# Patient Record
Sex: Male | Born: 1955
Health system: Southern US, Community
[De-identification: ages and names within clinical notes are randomized; demographics above are authoritative.]

## PROBLEM LIST (undated history)

## (undated) DIAGNOSIS — I4891 Unspecified atrial fibrillation: Secondary | ICD-10-CM

## (undated) DIAGNOSIS — I10 Essential (primary) hypertension: Secondary | ICD-10-CM

## (undated) DIAGNOSIS — I499 Cardiac arrhythmia, unspecified: Secondary | ICD-10-CM

## (undated) DIAGNOSIS — Z86018 Personal history of other benign neoplasm: Secondary | ICD-10-CM

## (undated) DIAGNOSIS — Z87442 Personal history of urinary calculi: Secondary | ICD-10-CM

## (undated) DIAGNOSIS — M1990 Unspecified osteoarthritis, unspecified site: Secondary | ICD-10-CM

## (undated) DIAGNOSIS — C801 Malignant (primary) neoplasm, unspecified: Secondary | ICD-10-CM

## (undated) DIAGNOSIS — D3500 Benign neoplasm of unspecified adrenal gland: Secondary | ICD-10-CM

## (undated) DIAGNOSIS — E119 Type 2 diabetes mellitus without complications: Secondary | ICD-10-CM

## (undated) DIAGNOSIS — K219 Gastro-esophageal reflux disease without esophagitis: Secondary | ICD-10-CM

## (undated) DIAGNOSIS — M4800 Spinal stenosis, site unspecified: Secondary | ICD-10-CM

## (undated) DIAGNOSIS — G4733 Obstructive sleep apnea (adult) (pediatric): Secondary | ICD-10-CM

## (undated) DIAGNOSIS — E039 Hypothyroidism, unspecified: Secondary | ICD-10-CM

## (undated) HISTORY — PX: THYROIDECTOMY: SHX17

## (undated) HISTORY — PX: OTHER SURGICAL HISTORY: SHX169

## (undated) HISTORY — DX: Malignant (primary) neoplasm, unspecified: C80.1

## (undated) HISTORY — PX: EYE SURGERY: SHX253

## (undated) HISTORY — DX: Benign neoplasm of unspecified adrenal gland: D35.00

## (undated) HISTORY — PX: UMBILICAL HERNIA REPAIR: SHX196

## (undated) HISTORY — PX: BACK SURGERY: SHX140

## (undated) HISTORY — PX: TONSILLECTOMY: SUR1361

## (undated) HISTORY — PX: CYSTOSCOPY: SUR368

## (undated) HISTORY — PX: ADRENAL GLAND SURGERY: SHX544

## (undated) HISTORY — PX: COLONOSCOPY: SHX174

## (undated) SURGERY — Surgical Case
Anesthesia: *Unknown

## (undated) MED FILL — Fluorouracil IV Soln 5 GM/100ML (50 MG/ML): INTRAVENOUS | Qty: 121 | Status: AC

---

## 2011-10-07 ENCOUNTER — Other Ambulatory Visit: Payer: Self-pay | Admitting: Internal Medicine

## 2011-10-07 DIAGNOSIS — C73 Malignant neoplasm of thyroid gland: Secondary | ICD-10-CM

## 2011-10-15 ENCOUNTER — Other Ambulatory Visit: Payer: Self-pay

## 2011-12-29 ENCOUNTER — Other Ambulatory Visit: Payer: Self-pay

## 2013-09-28 ENCOUNTER — Other Ambulatory Visit (HOSPITAL_BASED_OUTPATIENT_CLINIC_OR_DEPARTMENT_OTHER): Payer: Self-pay | Admitting: Family Medicine

## 2013-09-28 DIAGNOSIS — N200 Calculus of kidney: Secondary | ICD-10-CM

## 2013-09-28 DIAGNOSIS — R109 Unspecified abdominal pain: Secondary | ICD-10-CM

## 2013-09-29 ENCOUNTER — Other Ambulatory Visit (HOSPITAL_BASED_OUTPATIENT_CLINIC_OR_DEPARTMENT_OTHER): Payer: Self-pay

## 2015-05-29 DIAGNOSIS — E784 Other hyperlipidemia: Secondary | ICD-10-CM | POA: Diagnosis not present

## 2015-05-29 DIAGNOSIS — E038 Other specified hypothyroidism: Secondary | ICD-10-CM | POA: Diagnosis not present

## 2015-05-29 DIAGNOSIS — I10 Essential (primary) hypertension: Secondary | ICD-10-CM | POA: Diagnosis not present

## 2015-05-29 DIAGNOSIS — Z6833 Body mass index (BMI) 33.0-33.9, adult: Secondary | ICD-10-CM | POA: Diagnosis not present

## 2015-05-29 DIAGNOSIS — Z125 Encounter for screening for malignant neoplasm of prostate: Secondary | ICD-10-CM | POA: Diagnosis not present

## 2015-05-29 DIAGNOSIS — G4733 Obstructive sleep apnea (adult) (pediatric): Secondary | ICD-10-CM | POA: Diagnosis not present

## 2015-07-27 DIAGNOSIS — G4733 Obstructive sleep apnea (adult) (pediatric): Secondary | ICD-10-CM | POA: Diagnosis not present

## 2015-09-18 DIAGNOSIS — E784 Other hyperlipidemia: Secondary | ICD-10-CM | POA: Diagnosis not present

## 2015-09-18 DIAGNOSIS — I10 Essential (primary) hypertension: Secondary | ICD-10-CM | POA: Diagnosis not present

## 2015-09-18 DIAGNOSIS — E038 Other specified hypothyroidism: Secondary | ICD-10-CM | POA: Diagnosis not present

## 2015-09-18 DIAGNOSIS — G4733 Obstructive sleep apnea (adult) (pediatric): Secondary | ICD-10-CM | POA: Diagnosis not present

## 2015-09-18 DIAGNOSIS — Z6834 Body mass index (BMI) 34.0-34.9, adult: Secondary | ICD-10-CM | POA: Diagnosis not present

## 2015-09-18 DIAGNOSIS — E1165 Type 2 diabetes mellitus with hyperglycemia: Secondary | ICD-10-CM | POA: Diagnosis not present

## 2015-10-04 DIAGNOSIS — T63441A Toxic effect of venom of bees, accidental (unintentional), initial encounter: Secondary | ICD-10-CM | POA: Diagnosis not present

## 2016-01-18 DIAGNOSIS — R10823 Right lower quadrant rebound abdominal tenderness: Secondary | ICD-10-CM | POA: Diagnosis not present

## 2016-01-18 DIAGNOSIS — E1165 Type 2 diabetes mellitus with hyperglycemia: Secondary | ICD-10-CM | POA: Diagnosis not present

## 2016-01-18 DIAGNOSIS — E784 Other hyperlipidemia: Secondary | ICD-10-CM | POA: Diagnosis not present

## 2016-01-18 DIAGNOSIS — I10 Essential (primary) hypertension: Secondary | ICD-10-CM | POA: Diagnosis not present

## 2016-02-28 DIAGNOSIS — N132 Hydronephrosis with renal and ureteral calculous obstruction: Secondary | ICD-10-CM | POA: Diagnosis not present

## 2016-02-28 DIAGNOSIS — Z6834 Body mass index (BMI) 34.0-34.9, adult: Secondary | ICD-10-CM | POA: Diagnosis not present

## 2016-02-28 DIAGNOSIS — N200 Calculus of kidney: Secondary | ICD-10-CM | POA: Diagnosis not present

## 2016-03-03 DIAGNOSIS — N201 Calculus of ureter: Secondary | ICD-10-CM | POA: Diagnosis not present

## 2016-03-03 DIAGNOSIS — R3912 Poor urinary stream: Secondary | ICD-10-CM | POA: Diagnosis not present

## 2016-03-04 DIAGNOSIS — E119 Type 2 diabetes mellitus without complications: Secondary | ICD-10-CM | POA: Diagnosis not present

## 2016-03-04 DIAGNOSIS — I10 Essential (primary) hypertension: Secondary | ICD-10-CM | POA: Diagnosis not present

## 2016-03-04 DIAGNOSIS — Z466 Encounter for fitting and adjustment of urinary device: Secondary | ICD-10-CM | POA: Diagnosis not present

## 2016-03-04 DIAGNOSIS — G4730 Sleep apnea, unspecified: Secondary | ICD-10-CM | POA: Diagnosis not present

## 2016-03-04 DIAGNOSIS — N201 Calculus of ureter: Secondary | ICD-10-CM | POA: Diagnosis not present

## 2016-03-13 DIAGNOSIS — G4730 Sleep apnea, unspecified: Secondary | ICD-10-CM | POA: Diagnosis not present

## 2016-03-13 DIAGNOSIS — I10 Essential (primary) hypertension: Secondary | ICD-10-CM | POA: Diagnosis not present

## 2016-03-13 DIAGNOSIS — Z960 Presence of urogenital implants: Secondary | ICD-10-CM | POA: Diagnosis not present

## 2016-03-13 DIAGNOSIS — N201 Calculus of ureter: Secondary | ICD-10-CM | POA: Diagnosis not present

## 2016-05-27 DIAGNOSIS — Z6833 Body mass index (BMI) 33.0-33.9, adult: Secondary | ICD-10-CM | POA: Diagnosis not present

## 2016-05-27 DIAGNOSIS — I10 Essential (primary) hypertension: Secondary | ICD-10-CM | POA: Diagnosis not present

## 2016-05-27 DIAGNOSIS — N200 Calculus of kidney: Secondary | ICD-10-CM | POA: Diagnosis not present

## 2016-07-16 DIAGNOSIS — Z85850 Personal history of malignant neoplasm of thyroid: Secondary | ICD-10-CM | POA: Diagnosis not present

## 2016-07-16 DIAGNOSIS — E890 Postprocedural hypothyroidism: Secondary | ICD-10-CM | POA: Diagnosis not present

## 2016-07-16 DIAGNOSIS — D3500 Benign neoplasm of unspecified adrenal gland: Secondary | ICD-10-CM | POA: Diagnosis not present

## 2016-07-25 ENCOUNTER — Telehealth: Payer: Self-pay | Admitting: Genetics

## 2016-07-25 NOTE — Telephone Encounter (Signed)
Received a call from [facility] to schedule the pt an appt for genetic counseling. Appt has been scheduled for the pt to see Vicente Males on 6/26 at 2pm.

## 2016-08-03 ENCOUNTER — Encounter: Payer: Self-pay | Admitting: Genetics

## 2016-08-12 ENCOUNTER — Encounter: Payer: Self-pay | Admitting: Genetics

## 2016-08-12 ENCOUNTER — Other Ambulatory Visit: Payer: Self-pay

## 2016-08-22 DIAGNOSIS — Z6835 Body mass index (BMI) 35.0-35.9, adult: Secondary | ICD-10-CM | POA: Diagnosis not present

## 2016-08-22 DIAGNOSIS — K420 Umbilical hernia with obstruction, without gangrene: Secondary | ICD-10-CM | POA: Diagnosis not present

## 2016-08-22 DIAGNOSIS — I10 Essential (primary) hypertension: Secondary | ICD-10-CM | POA: Diagnosis not present

## 2016-09-02 DIAGNOSIS — N202 Calculus of kidney with calculus of ureter: Secondary | ICD-10-CM | POA: Diagnosis not present

## 2016-09-02 DIAGNOSIS — K5730 Diverticulosis of large intestine without perforation or abscess without bleeding: Secondary | ICD-10-CM | POA: Diagnosis not present

## 2016-09-08 DIAGNOSIS — K429 Umbilical hernia without obstruction or gangrene: Secondary | ICD-10-CM | POA: Diagnosis not present

## 2016-09-17 DIAGNOSIS — K429 Umbilical hernia without obstruction or gangrene: Secondary | ICD-10-CM | POA: Diagnosis not present

## 2016-09-23 DIAGNOSIS — G4733 Obstructive sleep apnea (adult) (pediatric): Secondary | ICD-10-CM | POA: Diagnosis not present

## 2016-10-01 DIAGNOSIS — Z1211 Encounter for screening for malignant neoplasm of colon: Secondary | ICD-10-CM | POA: Diagnosis not present

## 2016-10-01 DIAGNOSIS — K429 Umbilical hernia without obstruction or gangrene: Secondary | ICD-10-CM | POA: Diagnosis not present

## 2016-10-13 DIAGNOSIS — K5730 Diverticulosis of large intestine without perforation or abscess without bleeding: Secondary | ICD-10-CM | POA: Diagnosis not present

## 2016-10-13 DIAGNOSIS — D128 Benign neoplasm of rectum: Secondary | ICD-10-CM | POA: Diagnosis not present

## 2016-10-13 DIAGNOSIS — Q438 Other specified congenital malformations of intestine: Secondary | ICD-10-CM | POA: Diagnosis not present

## 2016-10-13 DIAGNOSIS — Z1211 Encounter for screening for malignant neoplasm of colon: Secondary | ICD-10-CM | POA: Diagnosis not present

## 2016-10-24 DIAGNOSIS — G4733 Obstructive sleep apnea (adult) (pediatric): Secondary | ICD-10-CM | POA: Diagnosis not present

## 2016-10-28 DIAGNOSIS — I872 Venous insufficiency (chronic) (peripheral): Secondary | ICD-10-CM | POA: Diagnosis not present

## 2016-10-28 DIAGNOSIS — G4739 Other sleep apnea: Secondary | ICD-10-CM | POA: Diagnosis not present

## 2016-10-28 DIAGNOSIS — I10 Essential (primary) hypertension: Secondary | ICD-10-CM | POA: Diagnosis not present

## 2016-10-28 DIAGNOSIS — E119 Type 2 diabetes mellitus without complications: Secondary | ICD-10-CM | POA: Diagnosis not present

## 2016-11-23 DIAGNOSIS — G4733 Obstructive sleep apnea (adult) (pediatric): Secondary | ICD-10-CM | POA: Diagnosis not present

## 2016-11-25 DIAGNOSIS — I10 Essential (primary) hypertension: Secondary | ICD-10-CM | POA: Diagnosis not present

## 2016-11-25 DIAGNOSIS — G4730 Sleep apnea, unspecified: Secondary | ICD-10-CM | POA: Diagnosis not present

## 2016-11-25 DIAGNOSIS — K439 Ventral hernia without obstruction or gangrene: Secondary | ICD-10-CM | POA: Diagnosis not present

## 2016-11-25 DIAGNOSIS — E039 Hypothyroidism, unspecified: Secondary | ICD-10-CM | POA: Diagnosis not present

## 2016-12-09 DIAGNOSIS — E668 Other obesity: Secondary | ICD-10-CM | POA: Diagnosis not present

## 2016-12-09 DIAGNOSIS — F1099 Alcohol use, unspecified with unspecified alcohol-induced disorder: Secondary | ICD-10-CM | POA: Diagnosis not present

## 2016-12-09 DIAGNOSIS — E896 Postprocedural adrenocortical (-medullary) hypofunction: Secondary | ICD-10-CM | POA: Diagnosis not present

## 2016-12-09 DIAGNOSIS — Z791 Long term (current) use of non-steroidal anti-inflammatories (NSAID): Secondary | ICD-10-CM | POA: Diagnosis not present

## 2016-12-09 DIAGNOSIS — E890 Postprocedural hypothyroidism: Secondary | ICD-10-CM | POA: Diagnosis not present

## 2016-12-09 DIAGNOSIS — Z79899 Other long term (current) drug therapy: Secondary | ICD-10-CM | POA: Diagnosis not present

## 2016-12-09 DIAGNOSIS — Z91018 Allergy to other foods: Secondary | ICD-10-CM | POA: Diagnosis not present

## 2016-12-09 DIAGNOSIS — G4730 Sleep apnea, unspecified: Secondary | ICD-10-CM | POA: Diagnosis not present

## 2016-12-09 DIAGNOSIS — M549 Dorsalgia, unspecified: Secondary | ICD-10-CM | POA: Diagnosis not present

## 2016-12-09 DIAGNOSIS — Z7982 Long term (current) use of aspirin: Secondary | ICD-10-CM | POA: Diagnosis not present

## 2016-12-09 DIAGNOSIS — E669 Obesity, unspecified: Secondary | ICD-10-CM | POA: Diagnosis not present

## 2016-12-09 DIAGNOSIS — K429 Umbilical hernia without obstruction or gangrene: Secondary | ICD-10-CM | POA: Diagnosis not present

## 2016-12-09 DIAGNOSIS — I10 Essential (primary) hypertension: Secondary | ICD-10-CM | POA: Diagnosis not present

## 2016-12-09 DIAGNOSIS — M1990 Unspecified osteoarthritis, unspecified site: Secondary | ICD-10-CM | POA: Diagnosis not present

## 2016-12-09 DIAGNOSIS — Z6834 Body mass index (BMI) 34.0-34.9, adult: Secondary | ICD-10-CM | POA: Diagnosis not present

## 2016-12-09 DIAGNOSIS — Z7984 Long term (current) use of oral hypoglycemic drugs: Secondary | ICD-10-CM | POA: Diagnosis not present

## 2016-12-09 DIAGNOSIS — K660 Peritoneal adhesions (postprocedural) (postinfection): Secondary | ICD-10-CM | POA: Diagnosis not present

## 2016-12-09 DIAGNOSIS — K439 Ventral hernia without obstruction or gangrene: Secondary | ICD-10-CM | POA: Diagnosis not present

## 2016-12-09 DIAGNOSIS — Z85850 Personal history of malignant neoplasm of thyroid: Secondary | ICD-10-CM | POA: Diagnosis not present

## 2016-12-10 DIAGNOSIS — Z79899 Other long term (current) drug therapy: Secondary | ICD-10-CM | POA: Diagnosis not present

## 2016-12-10 DIAGNOSIS — Z7982 Long term (current) use of aspirin: Secondary | ICD-10-CM | POA: Diagnosis not present

## 2016-12-10 DIAGNOSIS — E890 Postprocedural hypothyroidism: Secondary | ICD-10-CM | POA: Diagnosis not present

## 2016-12-10 DIAGNOSIS — Z85850 Personal history of malignant neoplasm of thyroid: Secondary | ICD-10-CM | POA: Diagnosis not present

## 2016-12-10 DIAGNOSIS — Z91018 Allergy to other foods: Secondary | ICD-10-CM | POA: Diagnosis not present

## 2016-12-10 DIAGNOSIS — M549 Dorsalgia, unspecified: Secondary | ICD-10-CM | POA: Diagnosis not present

## 2016-12-10 DIAGNOSIS — E669 Obesity, unspecified: Secondary | ICD-10-CM | POA: Diagnosis not present

## 2016-12-10 DIAGNOSIS — E896 Postprocedural adrenocortical (-medullary) hypofunction: Secondary | ICD-10-CM | POA: Diagnosis not present

## 2016-12-10 DIAGNOSIS — G4730 Sleep apnea, unspecified: Secondary | ICD-10-CM | POA: Diagnosis not present

## 2016-12-10 DIAGNOSIS — M1990 Unspecified osteoarthritis, unspecified site: Secondary | ICD-10-CM | POA: Diagnosis not present

## 2016-12-10 DIAGNOSIS — K439 Ventral hernia without obstruction or gangrene: Secondary | ICD-10-CM | POA: Diagnosis not present

## 2016-12-10 DIAGNOSIS — K429 Umbilical hernia without obstruction or gangrene: Secondary | ICD-10-CM | POA: Diagnosis not present

## 2016-12-10 DIAGNOSIS — Z6834 Body mass index (BMI) 34.0-34.9, adult: Secondary | ICD-10-CM | POA: Diagnosis not present

## 2016-12-10 DIAGNOSIS — I10 Essential (primary) hypertension: Secondary | ICD-10-CM | POA: Diagnosis not present

## 2016-12-10 DIAGNOSIS — Z7984 Long term (current) use of oral hypoglycemic drugs: Secondary | ICD-10-CM | POA: Diagnosis not present

## 2016-12-10 DIAGNOSIS — Z791 Long term (current) use of non-steroidal anti-inflammatories (NSAID): Secondary | ICD-10-CM | POA: Diagnosis not present

## 2016-12-24 DIAGNOSIS — G4733 Obstructive sleep apnea (adult) (pediatric): Secondary | ICD-10-CM | POA: Diagnosis not present

## 2016-12-29 DIAGNOSIS — I10 Essential (primary) hypertension: Secondary | ICD-10-CM | POA: Diagnosis not present

## 2016-12-29 DIAGNOSIS — E032 Hypothyroidism due to medicaments and other exogenous substances: Secondary | ICD-10-CM | POA: Diagnosis not present

## 2016-12-29 DIAGNOSIS — E119 Type 2 diabetes mellitus without complications: Secondary | ICD-10-CM | POA: Diagnosis not present

## 2016-12-29 DIAGNOSIS — I872 Venous insufficiency (chronic) (peripheral): Secondary | ICD-10-CM | POA: Diagnosis not present

## 2016-12-29 DIAGNOSIS — Z6834 Body mass index (BMI) 34.0-34.9, adult: Secondary | ICD-10-CM | POA: Diagnosis not present

## 2016-12-30 DIAGNOSIS — K439 Ventral hernia without obstruction or gangrene: Secondary | ICD-10-CM | POA: Diagnosis not present

## 2017-01-23 DIAGNOSIS — G4733 Obstructive sleep apnea (adult) (pediatric): Secondary | ICD-10-CM | POA: Diagnosis not present

## 2017-02-06 DIAGNOSIS — I10 Essential (primary) hypertension: Secondary | ICD-10-CM | POA: Diagnosis not present

## 2017-03-20 DIAGNOSIS — E119 Type 2 diabetes mellitus without complications: Secondary | ICD-10-CM | POA: Diagnosis not present

## 2017-03-20 DIAGNOSIS — I10 Essential (primary) hypertension: Secondary | ICD-10-CM | POA: Diagnosis not present

## 2017-03-20 DIAGNOSIS — M791 Myalgia, unspecified site: Secondary | ICD-10-CM | POA: Diagnosis not present

## 2017-03-26 DIAGNOSIS — G4733 Obstructive sleep apnea (adult) (pediatric): Secondary | ICD-10-CM | POA: Diagnosis not present

## 2017-04-23 DIAGNOSIS — G4733 Obstructive sleep apnea (adult) (pediatric): Secondary | ICD-10-CM | POA: Diagnosis not present

## 2017-05-14 DIAGNOSIS — M25561 Pain in right knee: Secondary | ICD-10-CM | POA: Diagnosis not present

## 2017-05-14 DIAGNOSIS — M25562 Pain in left knee: Secondary | ICD-10-CM | POA: Diagnosis not present

## 2017-05-14 DIAGNOSIS — M2550 Pain in unspecified joint: Secondary | ICD-10-CM | POA: Diagnosis not present

## 2017-05-22 DIAGNOSIS — M2560 Stiffness of unspecified joint, not elsewhere classified: Secondary | ICD-10-CM | POA: Diagnosis not present

## 2017-05-22 DIAGNOSIS — M791 Myalgia, unspecified site: Secondary | ICD-10-CM | POA: Diagnosis not present

## 2017-05-22 DIAGNOSIS — Z86018 Personal history of other benign neoplasm: Secondary | ICD-10-CM | POA: Diagnosis not present

## 2017-05-22 DIAGNOSIS — Z85850 Personal history of malignant neoplasm of thyroid: Secondary | ICD-10-CM | POA: Diagnosis not present

## 2017-05-22 DIAGNOSIS — M2550 Pain in unspecified joint: Secondary | ICD-10-CM | POA: Diagnosis not present

## 2017-05-22 DIAGNOSIS — E785 Hyperlipidemia, unspecified: Secondary | ICD-10-CM | POA: Diagnosis not present

## 2017-05-22 DIAGNOSIS — E890 Postprocedural hypothyroidism: Secondary | ICD-10-CM | POA: Diagnosis not present

## 2017-05-25 DIAGNOSIS — Z85850 Personal history of malignant neoplasm of thyroid: Secondary | ICD-10-CM | POA: Diagnosis not present

## 2017-05-25 DIAGNOSIS — E890 Postprocedural hypothyroidism: Secondary | ICD-10-CM | POA: Diagnosis not present

## 2017-06-01 DIAGNOSIS — Z86018 Personal history of other benign neoplasm: Secondary | ICD-10-CM | POA: Diagnosis not present

## 2017-06-02 DIAGNOSIS — M170 Bilateral primary osteoarthritis of knee: Secondary | ICD-10-CM | POA: Diagnosis not present

## 2017-06-15 DIAGNOSIS — E890 Postprocedural hypothyroidism: Secondary | ICD-10-CM | POA: Diagnosis not present

## 2017-06-23 DIAGNOSIS — G4733 Obstructive sleep apnea (adult) (pediatric): Secondary | ICD-10-CM | POA: Diagnosis not present

## 2017-07-03 DIAGNOSIS — M2550 Pain in unspecified joint: Secondary | ICD-10-CM | POA: Diagnosis not present

## 2017-07-03 DIAGNOSIS — E890 Postprocedural hypothyroidism: Secondary | ICD-10-CM | POA: Diagnosis not present

## 2017-07-03 DIAGNOSIS — Z86018 Personal history of other benign neoplasm: Secondary | ICD-10-CM | POA: Diagnosis not present

## 2017-07-03 DIAGNOSIS — Z85850 Personal history of malignant neoplasm of thyroid: Secondary | ICD-10-CM | POA: Diagnosis not present

## 2017-07-21 DIAGNOSIS — E890 Postprocedural hypothyroidism: Secondary | ICD-10-CM | POA: Diagnosis not present

## 2017-07-23 DIAGNOSIS — R031 Nonspecific low blood-pressure reading: Secondary | ICD-10-CM | POA: Diagnosis not present

## 2017-07-23 DIAGNOSIS — I10 Essential (primary) hypertension: Secondary | ICD-10-CM | POA: Diagnosis not present

## 2017-09-23 DIAGNOSIS — G4733 Obstructive sleep apnea (adult) (pediatric): Secondary | ICD-10-CM | POA: Diagnosis not present

## 2017-10-24 DIAGNOSIS — G4733 Obstructive sleep apnea (adult) (pediatric): Secondary | ICD-10-CM | POA: Diagnosis not present

## 2018-01-07 DIAGNOSIS — Z86018 Personal history of other benign neoplasm: Secondary | ICD-10-CM | POA: Diagnosis not present

## 2018-01-07 DIAGNOSIS — E890 Postprocedural hypothyroidism: Secondary | ICD-10-CM | POA: Diagnosis not present

## 2018-01-07 DIAGNOSIS — R300 Dysuria: Secondary | ICD-10-CM | POA: Diagnosis not present

## 2018-01-07 DIAGNOSIS — Z85850 Personal history of malignant neoplasm of thyroid: Secondary | ICD-10-CM | POA: Diagnosis not present

## 2018-01-07 DIAGNOSIS — Z125 Encounter for screening for malignant neoplasm of prostate: Secondary | ICD-10-CM | POA: Diagnosis not present

## 2018-01-07 DIAGNOSIS — R350 Frequency of micturition: Secondary | ICD-10-CM | POA: Diagnosis not present

## 2018-01-07 DIAGNOSIS — E119 Type 2 diabetes mellitus without complications: Secondary | ICD-10-CM | POA: Diagnosis not present

## 2018-01-07 DIAGNOSIS — M2550 Pain in unspecified joint: Secondary | ICD-10-CM | POA: Diagnosis not present

## 2018-02-08 DIAGNOSIS — R7989 Other specified abnormal findings of blood chemistry: Secondary | ICD-10-CM | POA: Diagnosis not present

## 2018-02-18 DIAGNOSIS — E785 Hyperlipidemia, unspecified: Secondary | ICD-10-CM | POA: Diagnosis not present

## 2018-02-18 DIAGNOSIS — R350 Frequency of micturition: Secondary | ICD-10-CM | POA: Diagnosis not present

## 2018-02-18 DIAGNOSIS — E119 Type 2 diabetes mellitus without complications: Secondary | ICD-10-CM | POA: Diagnosis not present

## 2018-02-18 DIAGNOSIS — I10 Essential (primary) hypertension: Secondary | ICD-10-CM | POA: Diagnosis not present

## 2018-03-01 ENCOUNTER — Other Ambulatory Visit: Payer: Self-pay | Admitting: Physician Assistant

## 2018-03-01 DIAGNOSIS — R1011 Right upper quadrant pain: Secondary | ICD-10-CM

## 2018-03-04 DIAGNOSIS — N200 Calculus of kidney: Secondary | ICD-10-CM | POA: Diagnosis not present

## 2018-03-04 DIAGNOSIS — R109 Unspecified abdominal pain: Secondary | ICD-10-CM | POA: Diagnosis not present

## 2018-07-02 DIAGNOSIS — S0181XA Laceration without foreign body of other part of head, initial encounter: Secondary | ICD-10-CM | POA: Diagnosis not present

## 2018-07-02 DIAGNOSIS — Y92411 Interstate highway as the place of occurrence of the external cause: Secondary | ICD-10-CM | POA: Diagnosis not present

## 2018-07-02 DIAGNOSIS — R079 Chest pain, unspecified: Secondary | ICD-10-CM | POA: Diagnosis not present

## 2018-07-02 DIAGNOSIS — Z23 Encounter for immunization: Secondary | ICD-10-CM | POA: Diagnosis not present

## 2018-07-09 ENCOUNTER — Other Ambulatory Visit: Payer: Self-pay | Admitting: Urology

## 2018-07-09 DIAGNOSIS — K5730 Diverticulosis of large intestine without perforation or abscess without bleeding: Secondary | ICD-10-CM | POA: Diagnosis not present

## 2018-07-09 DIAGNOSIS — N201 Calculus of ureter: Secondary | ICD-10-CM | POA: Diagnosis not present

## 2018-07-09 DIAGNOSIS — N132 Hydronephrosis with renal and ureteral calculous obstruction: Secondary | ICD-10-CM | POA: Diagnosis not present

## 2018-07-09 DIAGNOSIS — R1084 Generalized abdominal pain: Secondary | ICD-10-CM | POA: Diagnosis not present

## 2018-07-14 ENCOUNTER — Encounter (HOSPITAL_COMMUNITY)
Admission: RE | Admit: 2018-07-14 | Discharge: 2018-07-14 | Disposition: A | Discharge status: Home / Self Care | Payer: BLUE CROSS/BLUE SHIELD | Source: Ambulatory Visit | Attending: Urology | Admitting: Urology

## 2018-07-14 ENCOUNTER — Other Ambulatory Visit (HOSPITAL_COMMUNITY)
Admission: RE | Admit: 2018-07-14 | Discharge: 2018-07-14 | Disposition: A | Discharge status: Home / Self Care | Payer: BLUE CROSS/BLUE SHIELD | Source: Ambulatory Visit | Attending: Urology | Admitting: Urology

## 2018-07-14 ENCOUNTER — Other Ambulatory Visit: Payer: Self-pay

## 2018-07-14 ENCOUNTER — Encounter (HOSPITAL_COMMUNITY): Payer: Self-pay

## 2018-07-14 DIAGNOSIS — Z01812 Encounter for preprocedural laboratory examination: Secondary | ICD-10-CM | POA: Insufficient documentation

## 2018-07-14 DIAGNOSIS — N132 Hydronephrosis with renal and ureteral calculous obstruction: Secondary | ICD-10-CM | POA: Diagnosis not present

## 2018-07-14 DIAGNOSIS — N202 Calculus of kidney with calculus of ureter: Secondary | ICD-10-CM | POA: Diagnosis not present

## 2018-07-14 DIAGNOSIS — Z1159 Encounter for screening for other viral diseases: Secondary | ICD-10-CM | POA: Insufficient documentation

## 2018-07-14 HISTORY — DX: Essential (primary) hypertension: I10

## 2018-07-14 HISTORY — DX: Spinal stenosis, site unspecified: M48.00

## 2018-07-14 HISTORY — DX: Unspecified osteoarthritis, unspecified site: M19.90

## 2018-07-14 HISTORY — DX: Hypothyroidism, unspecified: E03.9

## 2018-07-14 HISTORY — DX: Type 2 diabetes mellitus without complications: E11.9

## 2018-07-14 HISTORY — DX: Gastro-esophageal reflux disease without esophagitis: K21.9

## 2018-07-14 HISTORY — DX: Personal history of other benign neoplasm: Z86.018

## 2018-07-14 HISTORY — DX: Personal history of urinary calculi: Z87.442

## 2018-07-14 HISTORY — DX: Obstructive sleep apnea (adult) (pediatric): G47.33

## 2018-07-14 LAB — BASIC METABOLIC PANEL
Anion gap: 8 (ref 5–15)
BUN: 25 mg/dL — ABNORMAL HIGH (ref 8–23)
CO2: 30 mmol/L (ref 22–32)
Calcium: 9 mg/dL (ref 8.9–10.3)
Chloride: 100 mmol/L (ref 98–111)
Creatinine, Ser: 1.55 mg/dL — ABNORMAL HIGH (ref 0.61–1.24)
GFR calc Af Amer: 55 mL/min — ABNORMAL LOW (ref >60)
GFR calc non Af Amer: 47 mL/min — ABNORMAL LOW (ref >60)
Glucose, Bld: 122 mg/dL — ABNORMAL HIGH (ref 70–99)
Potassium: 4.2 mmol/L (ref 3.5–5.1)
Sodium: 138 mmol/L (ref 135–145)

## 2018-07-14 LAB — CBC
HCT: 45.2 % (ref 39.0–52.0)
Hemoglobin: 14.4 g/dL (ref 13.0–17.0)
MCH: 28.8 pg (ref 26.0–34.0)
MCHC: 31.9 g/dL (ref 30.0–36.0)
MCV: 90.4 fL (ref 80.0–100.0)
Platelets: 285 10*3/uL (ref 150–400)
RBC: 5 MIL/uL (ref 4.22–5.81)
RDW: 11.4 % — ABNORMAL LOW (ref 11.5–15.5)
WBC: 8.3 10*3/uL (ref 4.0–10.5)
nRBC: 0 % (ref 0.0–0.2)

## 2018-07-14 LAB — GLUCOSE, CAPILLARY: Glucose-Capillary: 110 mg/dL — ABNORMAL HIGH (ref 70–99)

## 2018-07-14 LAB — HEMOGLOBIN A1C
Hgb A1c MFr Bld: 6.9 % — ABNORMAL HIGH (ref 4.8–5.6)
Mean Plasma Glucose: 151.33 mg/dL

## 2018-07-14 LAB — SARS CORONAVIRUS 2 BY RT PCR (HOSPITAL ORDER, PERFORMED IN ~~LOC~~ HOSPITAL LAB): SARS Coronavirus 2: NEGATIVE

## 2018-07-14 NOTE — Pre-Procedure Instructions (Signed)
EKG and Chest X ray 07/02/2018 at [facility] located in care everywhere.

## 2018-07-14 NOTE — Patient Instructions (Addendum)
DUE TO COVID-19 NO VISITORS ARE ALLOWED IN THE HOSPITAL AT THIS TIME   COVID SWAB TESTING MUST BE COMPLETED ON: Today, Jul 14, 2018               (Must self quarantine after testing. Follow instructions on handout.)   Your procedure is scheduled on: Friday, Jul 16, 2018   Surgery Time:  2:45PM-4:12PM   Report to [facility]  Entrance    Report to admitting at 12:45PM   Call this number if you have problems the morning of surgery [phone]   Do not eat food :After Midnight.   May have liquids until 8:45AM day of surgery    CLEAR LIQUID DIET  Foods Allowed                                                                     Foods Excluded  Water, Black Coffee and tea, regular and decaf                             liquids that you cannot  Plain Jell-O in any flavor                                             see through such as: Fruit ices (not with fruit pulp)                                     milk, soups, orange juice  Iced Popsicles                                    All solid food Carbonated beverages, regular and diet                                    Cranberry, grape and apple juices Sports drinks like Gatorade Lightly seasoned clear broth or consume(fat free) Sugar, honey syrup  Sample Menu Breakfast                                Lunch                                     Supper Cranberry juice                    Beef broth                            Chicken broth Jell-O                                     Grape juice  Apple juice Coffee or tea                        Jell-O                                      Popsicle                                                Coffee or tea                        Coffee or tea   Brush your teeth the morning of surgery.   Do NOT smoke after Midnight   Take these medicines the morning of surgery with A SIP OF WATER: Levoxyl  DO NOT TAKE ANY DIABETIC MEDICATIONS DAY OF YOUR SURGERY                           You may not have any metal on your body including jewelry, and body piercings             Do not wear lotions, powders, perfumes/cologne, or deodorant                           Men may shave face and neck.   Do not bring valuables to the hospital. [place].   Contacts, dentures or bridgework may not be worn into surgery.    Patients discharged the day of surgery will not be allowed to drive home.   Special Instructions: Bring a copy of your healthcare power of attorney and living will documents         the day of surgery if you haven't scanned them in before.              Please read over the following fact sheets you were given:  [facility] - Preparing for Surgery Before surgery, you can play an important role.  Because skin is not sterile, your skin needs to be as free of germs as possible.  You can reduce the number of germs on your skin by washing with CHG (chlorahexidine gluconate) soap before surgery.  CHG is an antiseptic cleaner which kills germs and bonds with the skin to continue killing germs even after washing. Please DO NOT use if you have an allergy to CHG or antibacterial soaps.  If your skin becomes reddened/irritated stop using the CHG and inform your nurse when you arrive at Short Stay. Do not shave (including legs and underarms) for at least 48 hours prior to the first CHG shower.  You may shave your face/neck.  Please follow these instructions carefully:  1.  Shower with CHG Soap the night before surgery and the  morning of surgery.  2.  If you choose to wash your hair, wash your hair first as usual with your normal  shampoo.  3.  After you shampoo, rinse your hair and body thoroughly to remove the shampoo.  4.  Use CHG as you would any other liquid soap.  You can apply chg directly to the skin and wash.  Gently with a scrungie or clean washcloth.  5.  Apply the CHG  Soap to your body ONLY FROM THE NECK DOWN.   Do   not use on face/ open                           Wound or open sores. Avoid contact with eyes, ears mouth and   genitals (private parts).                       Wash face,  Genitals (private parts) with your normal soap.             6.  Wash thoroughly, paying special attention to the area where your    surgery  will be performed.  7.  Thoroughly rinse your body with warm water from the neck down.  8.  DO NOT shower/wash with your normal soap after using and rinsing off the CHG Soap.                9.  Pat yourself dry with a clean towel.            10.  Wear clean pajamas.            11.  Place clean sheets on your bed the night of your first shower and do not  sleep with pets. Day of Surgery : Do not apply any lotions/deodorants the morning of surgery.  Please wear clean clothes to the hospital/surgery center.  FAILURE TO FOLLOW THESE INSTRUCTIONS MAY RESULT IN THE CANCELLATION OF YOUR SURGERY  PATIENT SIGNATURE_________________________________  NURSE SIGNATURE__________________________________  ________________________________________________________________________

## 2018-07-15 MED ORDER — GENTAMICIN SULFATE 40 MG/ML IJ SOLN
5.0000 mg/kg | INTRAVENOUS | Status: AC
  Administered 2018-07-16: 464.4 mg via INTRAVENOUS
  Filled 2018-07-15: qty 11.5

## 2018-07-15 NOTE — Progress Notes (Signed)
SPOKE W/  _victor Dempsey     SCREENING SYMPTOMS OF COVID 19:   COUGH--no  RUNNY NOSE--- no  SORE THROAT---no NASAL CONGESTION----no  SNEEZING----no  SHORTNESS OF BREATH---no  DIFFICULTY BREATHING---no  TEMP >100.0 -----no  UNEXPLAINED BODY ACHES------no  CHILLS -------- no  HEADACHES ---------no  LOSS OF SMELL/ TASTE -------- no   HAVE YOU OR ANY FAMILY MEMBER TRAVELLED PAST 14 DAYS OUT OF THE   COUNTY---no STATE----no COUNTRY----no  HAVE YOU OR ANY FAMILY MEMBER BEEN EXPOSED TO ANYONE WITH COVID 19? no

## 2018-07-16 ENCOUNTER — Ambulatory Visit (HOSPITAL_COMMUNITY): Payer: BLUE CROSS/BLUE SHIELD | Admitting: Physician Assistant

## 2018-07-16 ENCOUNTER — Ambulatory Visit (HOSPITAL_COMMUNITY): Payer: BLUE CROSS/BLUE SHIELD

## 2018-07-16 ENCOUNTER — Encounter (HOSPITAL_COMMUNITY): Payer: Self-pay | Admitting: Certified Registered"

## 2018-07-16 ENCOUNTER — Encounter (HOSPITAL_COMMUNITY)
Admission: RE | Disposition: A | Discharge status: Home / Self Care | Payer: Self-pay | Source: Other Acute Inpatient Hospital | Attending: Urology

## 2018-07-16 ENCOUNTER — Ambulatory Visit (HOSPITAL_COMMUNITY)
Admission: RE | Admit: 2018-07-16 | Discharge: 2018-07-16 | Disposition: A | Discharge status: Home / Self Care | Payer: BLUE CROSS/BLUE SHIELD | Source: Other Acute Inpatient Hospital | Attending: Urology | Admitting: Urology

## 2018-07-16 ENCOUNTER — Ambulatory Visit (HOSPITAL_COMMUNITY): Payer: BLUE CROSS/BLUE SHIELD | Admitting: Certified Registered"

## 2018-07-16 ENCOUNTER — Other Ambulatory Visit: Payer: Self-pay

## 2018-07-16 DIAGNOSIS — N201 Calculus of ureter: Secondary | ICD-10-CM | POA: Diagnosis not present

## 2018-07-16 DIAGNOSIS — E119 Type 2 diabetes mellitus without complications: Secondary | ICD-10-CM | POA: Insufficient documentation

## 2018-07-16 DIAGNOSIS — Z6832 Body mass index (BMI) 32.0-32.9, adult: Secondary | ICD-10-CM | POA: Insufficient documentation

## 2018-07-16 DIAGNOSIS — N132 Hydronephrosis with renal and ureteral calculous obstruction: Secondary | ICD-10-CM | POA: Insufficient documentation

## 2018-07-16 DIAGNOSIS — E669 Obesity, unspecified: Secondary | ICD-10-CM | POA: Insufficient documentation

## 2018-07-16 DIAGNOSIS — N202 Calculus of kidney with calculus of ureter: Secondary | ICD-10-CM | POA: Diagnosis not present

## 2018-07-16 DIAGNOSIS — Z9989 Dependence on other enabling machines and devices: Secondary | ICD-10-CM | POA: Insufficient documentation

## 2018-07-16 DIAGNOSIS — G4733 Obstructive sleep apnea (adult) (pediatric): Secondary | ICD-10-CM | POA: Diagnosis not present

## 2018-07-16 DIAGNOSIS — E039 Hypothyroidism, unspecified: Secondary | ICD-10-CM | POA: Diagnosis not present

## 2018-07-16 DIAGNOSIS — I10 Essential (primary) hypertension: Secondary | ICD-10-CM | POA: Insufficient documentation

## 2018-07-16 HISTORY — PX: HOLMIUM LASER APPLICATION: SHX5852

## 2018-07-16 HISTORY — PX: CYSTOSCOPY WITH RETROGRADE PYELOGRAM, URETEROSCOPY AND STENT PLACEMENT: SHX5789

## 2018-07-16 LAB — GLUCOSE, CAPILLARY: Glucose-Capillary: 111 mg/dL — ABNORMAL HIGH (ref 70–99)

## 2018-07-16 SURGERY — CYSTOURETEROSCOPY, WITH RETROGRADE PYELOGRAM AND STENT INSERTION
Anesthesia: General | Laterality: Bilateral

## 2018-07-16 MED ORDER — MIDAZOLAM HCL 2 MG/2ML IJ SOLN
INTRAMUSCULAR | Status: AC
  Filled 2018-07-16: qty 2

## 2018-07-16 MED ORDER — EPHEDRINE SULFATE-NACL 50-0.9 MG/10ML-% IV SOSY
PREFILLED_SYRINGE | INTRAVENOUS | Status: DC | PRN
  Administered 2018-07-16: 10 mg via INTRAVENOUS

## 2018-07-16 MED ORDER — LACTATED RINGERS IV SOLN: INTRAVENOUS | Status: DC

## 2018-07-16 MED ORDER — FENTANYL CITRATE (PF) 100 MCG/2ML IJ SOLN
INTRAMUSCULAR | Status: AC
  Filled 2018-07-16: qty 2

## 2018-07-16 MED ORDER — FENTANYL CITRATE (PF) 100 MCG/2ML IJ SOLN
25.0000 ug | INTRAMUSCULAR | Status: DC | PRN
  Administered 2018-07-16 (×3): 50 ug via INTRAVENOUS

## 2018-07-16 MED ORDER — DEXAMETHASONE SODIUM PHOSPHATE 10 MG/ML IJ SOLN
INTRAMUSCULAR | Status: AC
  Filled 2018-07-16: qty 2

## 2018-07-16 MED ORDER — OXYCODONE-ACETAMINOPHEN 5-325 MG PO TABS: 1.0000 | ORAL_TABLET | Freq: Four times a day (QID) | ORAL | 0 refills | Status: DC | PRN

## 2018-07-16 MED ORDER — LACTATED RINGERS IV SOLN
INTRAVENOUS | Status: DC
  Administered 2018-07-16 (×2): via INTRAVENOUS

## 2018-07-16 MED ORDER — PHENYLEPHRINE 40 MCG/ML (10ML) SYRINGE FOR IV PUSH (FOR BLOOD PRESSURE SUPPORT)
PREFILLED_SYRINGE | INTRAVENOUS | Status: AC
  Filled 2018-07-16: qty 10

## 2018-07-16 MED ORDER — LIDOCAINE 2% (20 MG/ML) 5 ML SYRINGE
INTRAMUSCULAR | Status: AC
  Filled 2018-07-16: qty 10

## 2018-07-16 MED ORDER — PHENYLEPHRINE 40 MCG/ML (10ML) SYRINGE FOR IV PUSH (FOR BLOOD PRESSURE SUPPORT)
PREFILLED_SYRINGE | INTRAVENOUS | Status: AC
  Filled 2018-07-16: qty 20

## 2018-07-16 MED ORDER — PHENYLEPHRINE 40 MCG/ML (10ML) SYRINGE FOR IV PUSH (FOR BLOOD PRESSURE SUPPORT)
PREFILLED_SYRINGE | INTRAVENOUS | Status: DC | PRN
  Administered 2018-07-16 (×4): 80 ug via INTRAVENOUS
  Administered 2018-07-16 (×2): 120 ug via INTRAVENOUS
  Administered 2018-07-16 (×3): 80 ug via INTRAVENOUS
  Administered 2018-07-16: 120 ug via INTRAVENOUS
  Administered 2018-07-16: 80 ug via INTRAVENOUS
  Administered 2018-07-16: 120 ug via INTRAVENOUS

## 2018-07-16 MED ORDER — MIDAZOLAM HCL 2 MG/2ML IJ SOLN
INTRAMUSCULAR | Status: DC | PRN
  Administered 2018-07-16: 1.5 mg via INTRAVENOUS
  Administered 2018-07-16: 0.5 mg via INTRAVENOUS

## 2018-07-16 MED ORDER — PROPOFOL 10 MG/ML IV BOLUS
INTRAVENOUS | Status: AC
  Filled 2018-07-16: qty 20

## 2018-07-16 MED ORDER — OXYCODONE HCL 5 MG PO TABS
ORAL_TABLET | ORAL | Status: AC
  Administered 2018-07-16: 5 mg
  Filled 2018-07-16: qty 1

## 2018-07-16 MED ORDER — 0.9 % SODIUM CHLORIDE (POUR BTL) OPTIME
TOPICAL | Status: DC | PRN
  Administered 2018-07-16: 1000 mL
  Administered 2018-07-16: 14:00:00 3000 mL

## 2018-07-16 MED ORDER — MEPERIDINE HCL 50 MG/ML IJ SOLN: 6.2500 mg | INTRAMUSCULAR | Status: DC | PRN

## 2018-07-16 MED ORDER — ONDANSETRON HCL 4 MG/2ML IJ SOLN
INTRAMUSCULAR | Status: AC
  Filled 2018-07-16: qty 4

## 2018-07-16 MED ORDER — ONDANSETRON HCL 4 MG/2ML IJ SOLN
INTRAMUSCULAR | Status: DC | PRN
  Administered 2018-07-16: 4 mg via INTRAVENOUS

## 2018-07-16 MED ORDER — KETOROLAC TROMETHAMINE 30 MG/ML IJ SOLN
INTRAMUSCULAR | Status: AC
  Administered 2018-07-16: 30 mg
  Filled 2018-07-16: qty 1

## 2018-07-16 MED ORDER — PROPOFOL 10 MG/ML IV BOLUS
INTRAVENOUS | Status: DC | PRN
  Administered 2018-07-16: 100 mg via INTRAVENOUS
  Administered 2018-07-16: 200 mg via INTRAVENOUS

## 2018-07-16 MED ORDER — METOCLOPRAMIDE HCL 5 MG/ML IJ SOLN
10.0000 mg | Freq: Once | INTRAMUSCULAR | Status: AC | PRN
  Administered 2018-07-16: 10 mg via INTRAVENOUS

## 2018-07-16 MED ORDER — KETOROLAC TROMETHAMINE 10 MG PO TABS: 10.0000 mg | ORAL_TABLET | Freq: Four times a day (QID) | ORAL | 0 refills | Status: DC | PRN

## 2018-07-16 MED ORDER — EPHEDRINE 5 MG/ML INJ
INTRAVENOUS | Status: AC
  Filled 2018-07-16: qty 10

## 2018-07-16 MED ORDER — FENTANYL CITRATE (PF) 100 MCG/2ML IJ SOLN
INTRAMUSCULAR | Status: DC | PRN
  Administered 2018-07-16 (×4): 50 ug via INTRAVENOUS

## 2018-07-16 MED ORDER — SODIUM CHLORIDE 0.9 % IR SOLN
Status: DC | PRN
  Administered 2018-07-16: 3000 mL

## 2018-07-16 MED ORDER — LIDOCAINE 2% (20 MG/ML) 5 ML SYRINGE
INTRAMUSCULAR | Status: DC | PRN
  Administered 2018-07-16: 60 mg via INTRAVENOUS
  Administered 2018-07-16: 40 mg via INTRAVENOUS

## 2018-07-16 MED ORDER — CEPHALEXIN 500 MG PO CAPS: 500.0000 mg | ORAL_CAPSULE | Freq: Two times a day (BID) | ORAL | 0 refills | Status: AC

## 2018-07-16 MED ORDER — METOCLOPRAMIDE HCL 5 MG/ML IJ SOLN
INTRAMUSCULAR | Status: AC
  Filled 2018-07-16: qty 2

## 2018-07-16 MED ORDER — SENNOSIDES-DOCUSATE SODIUM 8.6-50 MG PO TABS: 1.0000 | ORAL_TABLET | Freq: Two times a day (BID) | ORAL | 0 refills | Status: DC

## 2018-07-16 SURGICAL SUPPLY — 25 items
BAG URO CATCHER STRL LF (MISCELLANEOUS) ×2 IMPLANT
BASKET LASER NITINOL 1.9FR (BASKET) ×2 IMPLANT
BASKET STONE NCOMPASS (UROLOGICAL SUPPLIES) ×2 IMPLANT
CATH INTERMIT  6FR 70CM (CATHETERS) ×2 IMPLANT
CLOTH BEACON ORANGE TIMEOUT ST (SAFETY) ×2 IMPLANT
COVER SURGICAL LIGHT HANDLE (MISCELLANEOUS) ×2 IMPLANT
COVER WAND RF STERILE (DRAPES) IMPLANT
EXTRACTOR STONE 1.7FRX115CM (UROLOGICAL SUPPLIES) IMPLANT
FIBER LASER FLEXIVA 1000 (UROLOGICAL SUPPLIES) IMPLANT
FIBER LASER FLEXIVA 365 (UROLOGICAL SUPPLIES) IMPLANT
FIBER LASER FLEXIVA 550 (UROLOGICAL SUPPLIES) IMPLANT
FIBER LASER TRAC TIP (UROLOGICAL SUPPLIES) ×4 IMPLANT
GLOVE BIOGEL M STRL SZ7.5 (GLOVE) ×2 IMPLANT
GOWN STRL REUS W/TWL LRG LVL3 (GOWN DISPOSABLE) ×2 IMPLANT
GUIDEWIRE ANG ZIPWIRE 038X150 (WIRE) ×4 IMPLANT
GUIDEWIRE STR DUAL SENSOR (WIRE) ×4 IMPLANT
KIT TURNOVER KIT A (KITS) IMPLANT
MANIFOLD NEPTUNE II (INSTRUMENTS) ×2 IMPLANT
PACK CYSTO (CUSTOM PROCEDURE TRAY) ×2 IMPLANT
SHEATH URETERAL 12FRX28CM (UROLOGICAL SUPPLIES) IMPLANT
SHEATH URETERAL 12FRX35CM (MISCELLANEOUS) ×2 IMPLANT
STENT POLARIS 5FRX26 (STENTS) ×2 IMPLANT
TUBE FEEDING 8FR 16IN STR KANG (MISCELLANEOUS) ×2 IMPLANT
TUBING CONNECTING 10 (TUBING) ×2 IMPLANT
TUBING UROLOGY SET (TUBING) ×2 IMPLANT

## 2018-07-16 NOTE — Transfer of Care (Signed)
Immediate Anesthesia Transfer of Care Note  Patient: Samuel Hubbard  Procedure(s) Performed: CYSTOSCOPY WITH RETROGRADE PYELOGRAM, URETEROSCOPY AND STENT PLACEMENT (Bilateral ) HOLMIUM LASER APPLICATION (Bilateral )  Patient Location: PACU  Anesthesia Type:General  Level of Consciousness: sedated  Airway & Oxygen Therapy: Patient Spontanous Breathing and Patient connected to face mask oxygen  Post-op Assessment: Report given to RN and Post -op Vital signs reviewed and stable  Post vital signs: Reviewed and stable  Last Vitals:  Vitals Value Taken Time  BP 155/91 07/16/2018  3:23 PM  Temp    Pulse 88 07/16/2018  3:24 PM  Resp 20 07/16/2018  3:24 PM  SpO2 97 % 07/16/2018  3:24 PM  Vitals shown include unvalidated device data.  Last Pain:  Vitals:   07/16/18 1312  TempSrc:   PainSc: 2       Patients Stated Pain Goal: 3 (16/10/96 0454)  Complications: No apparent anesthesia complications

## 2018-07-16 NOTE — Brief Op Note (Signed)
07/16/2018  3:06 PM  PATIENT:  Derius Ghosh  63 y.o. male  PRE-OPERATIVE DIAGNOSIS:  LEFT URETERAL CALCULI  POST-OPERATIVE DIAGNOSIS:  LEFT URETERAL CALCULI, RIGHT RENAL CALCULI  PROCEDURE:  Procedure(s) with comments: CYSTOSCOPY WITH RETROGRADE PYELOGRAM, URETEROSCOPY AND STENT PLACEMENT (Bilateral) - 75 HOLMIUM LASER APPLICATION (Bilateral)  SURGEON:  Surgeon(s) and Role:    Alexis Frock, MD - Primary  PHYSICIAN ASSISTANT:   ASSISTANTS: none   ANESTHESIA:   general  EBL:  1 mL   BLOOD ADMINISTERED:none  DRAINS: none   LOCAL MEDICATIONS USED:  NONE  SPECIMEN:  Source of Specimen:  right renal / left ureteral stone  DISPOSITION OF SPECIMEN:  [facility] for compositional analysis  COUNTS:  YES  TOURNIQUET:  * No tourniquets in log *  DICTATION: .Other Dictation: Dictation Number  [id number]  PLAN OF CARE: Discharge to home after PACU  PATIENT DISPOSITION:  PACU - hemodynamically stable.   Delay start of Pharmacological VTE agent (>24hrs) due to surgical blood loss or risk of bleeding: not applicable

## 2018-07-16 NOTE — Anesthesia Preprocedure Evaluation (Addendum)
Anesthesia Evaluation  Patient identified by MRN, date of birth, ID band Patient awake    Reviewed: Allergy & Precautions, NPO status , Patient's Chart, lab work & pertinent test results  Airway Mallampati: II  TM Distance: >3 FB Neck ROM: Full    Dental no notable dental hx. (+) Dental Advisory Given   Pulmonary sleep apnea and Continuous Positive Airway Pressure Ventilation ,    Pulmonary exam normal breath sounds clear to auscultation       Cardiovascular hypertension, Pt. on medications Normal cardiovascular exam Rhythm:Regular Rate:Normal     Neuro/Psych negative neurological ROS  negative psych ROS   GI/Hepatic Neg liver ROS, GERD  ,  Endo/Other  diabetes  Renal/GU negative Renal ROS  negative genitourinary   Musculoskeletal negative musculoskeletal ROS (+)   Abdominal   Peds negative pediatric ROS (+)  Hematology negative hematology ROS (+)   Anesthesia Other Findings   Reproductive/Obstetrics negative OB ROS                            Anesthesia Physical Anesthesia Plan  ASA: II  Anesthesia Plan: General   Post-op Pain Management:    Induction: Intravenous  PONV Risk Score and Plan: 3 and Ondansetron, Treatment may vary due to age or medical condition and Midazolam  Airway Management Planned: LMA and Oral ETT  Additional Equipment:   Intra-op Plan:   Post-operative Plan: Extubation in OR  Informed Consent: I have reviewed the patients History and Physical, chart, labs and discussed the procedure including the risks, benefits and alternatives for the proposed anesthesia with the patient or authorized representative who has indicated his/her understanding and acceptance.     Dental advisory given  Plan Discussed with: CRNA  Anesthesia Plan Comments:         Anesthesia Quick Evaluation

## 2018-07-16 NOTE — Anesthesia Postprocedure Evaluation (Signed)
Anesthesia Post Note  Patient: Samuel Hubbard  Procedure(s) Performed: CYSTOSCOPY WITH RETROGRADE PYELOGRAM, URETEROSCOPY AND STENT PLACEMENT (Bilateral ) HOLMIUM LASER APPLICATION (Bilateral )     Patient location during evaluation: PACU Anesthesia Type: General Level of consciousness: awake and alert Pain management: pain level controlled Vital Signs Assessment: post-procedure vital signs reviewed and stable Respiratory status: spontaneous breathing, nonlabored ventilation, respiratory function stable and patient connected to nasal cannula oxygen Cardiovascular status: blood pressure returned to baseline and stable Postop Assessment: no apparent nausea or vomiting Anesthetic complications: no    Last Vitals:  Vitals:   07/16/18 1545 07/16/18 1600  BP: (!) 145/91 (!) 152/90  Pulse: 80 81  Resp: 13 15  Temp:  36.7 C  SpO2: 100% 99%    Last Pain:  Vitals:   07/16/18 1600  TempSrc:   PainSc: 6                  Montez Hageman
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
## 2018-07-16 NOTE — Anesthesia Procedure Notes (Signed)
Date/Time: 07/16/2018 3:13 PM Performed by: Cynda Familia, CRNA Oxygen Delivery Method: Simple face mask Placement Confirmation: positive ETCO2 and breath sounds checked- equal and bilateral Dental Injury: Teeth and Oropharynx as per pre-operative assessment

## 2018-07-16 NOTE — Discharge Instructions (Signed)
1 - You may have urinary urgency (bladder spasms) and bloody urine on / off with stent in place. This is normal.  2 - Removed tethered stents on Monday morning at home by pulling on string, then blue-white plastic tubing and discarding. There are TWO stents. Office is open Monday if any problems arise.   3 - Call MD or go to ER for fever >102, severe pain / nausea / vomiting not relieved by medications, or acute change in medical status

## 2018-07-16 NOTE — Anesthesia Procedure Notes (Addendum)
Procedure Name: LMA Insertion Date/Time: 07/16/2018 1:54 PM Performed by: Cynda Familia, CRNA Pre-anesthesia Checklist: Patient identified, Emergency Drugs available, Suction available and Patient being monitored Patient Re-evaluated:Patient Re-evaluated prior to induction Oxygen Delivery Method: Circle System Utilized Preoxygenation: Pre-oxygenation with 100% oxygen Induction Type: IV induction Ventilation: Mask ventilation without difficulty LMA: LMA inserted LMA Size: 4.0 Tube type: Oral (20 cc air) Number of attempts: 1 Placement Confirmation: positive ETCO2 Tube secured with: Tape Dental Injury: Teeth and Oropharynx as per pre-operative assessment  Comments: Smooth IV induction -- LMA with gastric port inserted-- removed leaking--- 4 LMA regular inserted by [place]-- leak-- repositioned with good seal--- bilat BS--- pt with chipped front teeth prior to LMA insertion-- pt did not report preop-- unchanged with AW manipulation

## 2018-07-17 ENCOUNTER — Encounter (HOSPITAL_COMMUNITY): Payer: Self-pay | Admitting: Urology

## 2018-07-17 NOTE — Op Note (Signed)
[name], Hubbard MEDICAL RECORD [id number] ACCOUNT [id number] DATE OF BIRTH:1955/03/08 FACILITY: [facility] LOCATION: [facility] PHYSICIAN:Lainey Nelson, MD  OPERATIVE REPORT  DATE OF PROCEDURE:  07/16/2018  PREOPERATIVE DIAGNOSIS:  Left ureteral stone, questionable right renal pelvis lesion.   POSTOPERATIVE DIAGNOSIS:  Left ureteral stone, right renal stones.  PROCEDURE: 1.  Cystoscopy, bilateral retrograde pyelograms, interpretation. 2.  Bilateral ureteroscopy with laser lithotripsy. 3.  Insertion of bilateral ureteral stents 5 x 26 Polaris with tether.  ESTIMATED BLOOD LOSS:  Nil.  COMPLICATIONS:  None.  SPECIMENS:  Right renal and left ureteral stone fragments for analysis.  FINDINGS: 1.  Moderate left hydronephrosis. 2.  Obstructing left distal ureteral stones. 3.  Complete resolution of all accessible stone fragments larger than one-third mm following laser lithotripsy and basket extraction on the left side. 4.  Successful placement of left ureteral stent proximal in the renal pelvis, distal end in the bladder. 5.  Unremarkable right pyelogram. 6.  Multifocal right renal stones, mostly lower pole. 7.  No evidence of urothelial neoplasm on the right side. 8.  Complete resolution of all accessible stone fragments larger than one-third mm following laser lead extraction on the right side. 9.  Successful placement of right ureteral stent proximal end renal pelvis, distal end in urinary bladder.  INDICATIONS:  The patient is a 63 year old gentleman with history of recurrent urolithiasis.  He was found on workup of colicky flank pain to have multifocal left ureteral stones.  He was initially given a trial of passage; however, he failed to pass the  stone.  On serial imaging was noted to have persistence of his left ureteral stone with some distal progression, but no passage and first assistance of his hydronephrosis on the left.  Given his stone volume, he was counseled  towards left ureteroscopy  with goal of stone free.  On his most recent imaging, he also had a questionable area of density in the lower pole of the right kidney somewhat concerning for possible urothelial neoplasm.  It was clearly felt that endoscopic evaluation of this was  warranted concomitantly.  Informed consent was then placed in medical record.  DESCRIPTION OF PROCEDURE:  The patient was identified.  Procedure of bilateral ureteroscopy was confirmed.  Procedure timeout was performed.  Intravenous antibiotics administered.  General LMA anesthesia induced.  The patient was placed into a low  lithotomy position, sterile field was created prepped and draped base of the penis, perineum and proximal thighs using iodine.  Cystourethroscopy was performed using 22-French rigid cystoscope with offset lens.  Inspection of anterior and posterior  urethra revealed some mild dystrophic calcifications, submucosal in the prostatic urethra.  Inspection unilateral no diverticula, calcifications, papillary lesions.  The left ureteral orifice was cannulated with 6-French renal catheter and left  retrograde pyelogram was obtained.  Left ventriculogram revealed a single left ureter with single system left kidney.  There was a filling defect in distal ureter consistent with known stone.  There was moderate hydroureteronephrosis above this.  A 0.038 ZIPwire was advanced to lower pole  and set aside as a safety wire.  Similarly, right pyelogram was obtained.  Right retrograde pyelogram shows a single right ureter single system right kidney.  No obvious filling defects or narrowing noted.  A separate 0.038 ZIPwire was advanced to the level of the upper pole of the right side and set aside as a safety wire.  An  8-French feeding tube was placed in the urinary bladder for pressure release, and semirigid ureteroscopy performed distal  4/5 of the right ureter alongside a separate sensor working wire.  No mucosal abnormalities  were found.  Next, a semirigid  ureteroscopy was performed of the distal left ureter alongside a separate sensor working wire.  In the area of the distal third of the ureter, there are multifocal ureteral stones noted.  These appeared to be much too large for simple basketing.  As  such, holmium laser energy applied 70 setting of 0.2 joules and 20 Hz.  These were fragmented into pieces approximately 1-2 mm in length.  They were then sequentially grasped in escape basket and navigated to the level of the urinary bladder and set  aside.  A semi-rigid ureteroscopy of the distal 4/5 of left ureter revealed complete resolution of all accessible stone fragments larger than one-third mm as the goal today was to render stone free.  The semirigid scope was then exchanged for a medium  length ureteral access sheath to the level of the proximal ureter using continuous fluoroscopic guidance and flexible digital ureteroscopy performed the proximal ureter and systematic inspection of the left kidney, including all calices x3.  There was  scant volume intrarenal stones in the left side.  A few small stones amenable to simple basketing.  The access sheath was removed under continuous vision, no mucosal abnormalities were found.  The access sheath was advanced up the right sensor working  wire to the level of the proximal right ureter and inspection of the proximal ureter and kidney was performed using a flexible digital ureteroscope.  Inspection of the right kidney, including all calices x3 only revealed multifocal intrarenal stones,  mostly in the lower pole.  The patient did have a very prominent lower pole papilla with surrounding small volume stones.  It was clearly felt that this represented the area of hypodensity that was questionably calcified in the lower pole.  There is no  evidence of papillary urothelial neoplasm whatsoever.  He also had several areas of papillary tip calcification in the mid pole.  These were  controlled using holmium laser energy as per the left.  Given a smaller fragment side, an NCompass basket was  then used with a net technique and all accessible stone fragments larger than 1 mm were removed from the right side.  The access sheath was removed under continuous vision.  No significant mucosal lesion was found on the right.  Given the bilateral nature  of the procedure, it was felt that brief interval stenting with tethered stents would be warranted.  As such, a new 5 x 6 Polaris-type stents were placed over the remaining safety wire using fluoroscopic guidance.  Good proximal and distal plane were  noted.  Tethers were left in place and fashioned to the dorsum of the penis and the procedure terminated.  The patient tolerated the procedure well.  No immediate perioperative complications.  The patient was taken to the postanesthesia care in stable  condition with plan for discharge home.  TN/NUANCE  D:07/16/2018 T:07/16/2018 JOB:[id number]

## 2018-07-19 DIAGNOSIS — N201 Calculus of ureter: Secondary | ICD-10-CM | POA: Diagnosis not present

## 2018-08-03 DIAGNOSIS — N202 Calculus of kidney with calculus of ureter: Secondary | ICD-10-CM | POA: Diagnosis not present

## 2018-08-03 DIAGNOSIS — Z125 Encounter for screening for malignant neoplasm of prostate: Secondary | ICD-10-CM | POA: Diagnosis not present

## 2018-09-07 DIAGNOSIS — M659 Synovitis and tenosynovitis, unspecified: Secondary | ICD-10-CM | POA: Diagnosis not present

## 2018-09-07 DIAGNOSIS — M170 Bilateral primary osteoarthritis of knee: Secondary | ICD-10-CM | POA: Diagnosis not present

## 2018-09-13 DIAGNOSIS — M25562 Pain in left knee: Secondary | ICD-10-CM | POA: Diagnosis not present

## 2018-10-04 DIAGNOSIS — N200 Calculus of kidney: Secondary | ICD-10-CM | POA: Diagnosis not present

## 2018-10-05 DIAGNOSIS — M1711 Unilateral primary osteoarthritis, right knee: Secondary | ICD-10-CM | POA: Diagnosis not present

## 2018-10-05 DIAGNOSIS — M25561 Pain in right knee: Secondary | ICD-10-CM | POA: Diagnosis not present

## 2018-10-05 DIAGNOSIS — M1712 Unilateral primary osteoarthritis, left knee: Secondary | ICD-10-CM | POA: Diagnosis not present

## 2018-10-05 DIAGNOSIS — M25562 Pain in left knee: Secondary | ICD-10-CM | POA: Diagnosis not present

## 2018-10-13 ENCOUNTER — Other Ambulatory Visit: Payer: Self-pay | Admitting: Family Medicine

## 2018-10-13 ENCOUNTER — Ambulatory Visit (INDEPENDENT_AMBULATORY_CARE_PROVIDER_SITE_OTHER): Payer: BC Managed Care – PPO | Admitting: Family Medicine

## 2018-10-13 ENCOUNTER — Encounter: Payer: Self-pay | Admitting: Family Medicine

## 2018-10-13 ENCOUNTER — Other Ambulatory Visit: Payer: Self-pay

## 2018-10-13 VITALS — BP 142/89 | HR 82 | Temp 98.3°F | Ht 73.0 in | Wt 245.0 lb

## 2018-10-13 DIAGNOSIS — Z23 Encounter for immunization: Secondary | ICD-10-CM | POA: Diagnosis not present

## 2018-10-13 DIAGNOSIS — I10 Essential (primary) hypertension: Secondary | ICD-10-CM

## 2018-10-13 DIAGNOSIS — E038 Other specified hypothyroidism: Secondary | ICD-10-CM

## 2018-10-13 DIAGNOSIS — E119 Type 2 diabetes mellitus without complications: Secondary | ICD-10-CM | POA: Diagnosis not present

## 2018-10-13 DIAGNOSIS — E039 Hypothyroidism, unspecified: Secondary | ICD-10-CM | POA: Insufficient documentation

## 2018-10-13 DIAGNOSIS — R300 Dysuria: Secondary | ICD-10-CM | POA: Diagnosis not present

## 2018-10-13 DIAGNOSIS — E1169 Type 2 diabetes mellitus with other specified complication: Secondary | ICD-10-CM | POA: Insufficient documentation

## 2018-10-13 DIAGNOSIS — E785 Hyperlipidemia, unspecified: Secondary | ICD-10-CM

## 2018-10-13 LAB — COMPLETE METABOLIC PANEL WITH GFR
AG Ratio: 1.9 (calc) (ref 1.0–2.5)
ALT: 23 U/L (ref 9–46)
AST: 14 U/L (ref 10–35)
Albumin: 4.1 g/dL (ref 3.6–5.1)
Alkaline phosphatase (APISO): 71 U/L (ref 35–144)
BUN/Creatinine Ratio: 25 (calc) — ABNORMAL HIGH (ref 6–22)
BUN: 27 mg/dL — ABNORMAL HIGH (ref 7–25)
CO2: 33 mmol/L — ABNORMAL HIGH (ref 20–32)
Calcium: 9.3 mg/dL (ref 8.6–10.3)
Chloride: 102 mmol/L (ref 98–110)
Creat: 1.1 mg/dL (ref 0.70–1.25)
GFR, Est African American: 83 mL/min/{1.73_m2} (ref >=60)
GFR, Est Non African American: 72 mL/min/{1.73_m2} (ref >=60)
Globulin: 2.2 g/dL (calc) (ref 1.9–3.7)
Glucose, Bld: 122 mg/dL — ABNORMAL HIGH (ref 65–99)
Potassium: 3.8 mmol/L (ref 3.5–5.3)
Sodium: 140 mmol/L (ref 135–146)
Total Bilirubin: 0.3 mg/dL (ref 0.2–1.2)
Total Protein: 6.3 g/dL (ref 6.1–8.1)

## 2018-10-13 LAB — POCT URINALYSIS DIP (MANUAL ENTRY)
Bilirubin, UA: NEGATIVE
Glucose, UA: 500 mg/dL — AB
Ketones, POC UA: NEGATIVE mg/dL
Leukocytes, UA: NEGATIVE
Nitrite, UA: NEGATIVE
Protein Ur, POC: NEGATIVE mg/dL
Spec Grav, UA: 1.025 (ref 1.010–1.025)
Urobilinogen, UA: 0.2 E.U./dL
pH, UA: 5 (ref 5.0–8.0)

## 2018-10-13 LAB — MICROALBUMIN, URINE

## 2018-10-13 LAB — LIPID PANEL
Cholesterol: 225 mg/dL — ABNORMAL HIGH (ref <200)
HDL: 46 mg/dL (ref >=40)
LDL Cholesterol (Calc): 152 mg/dL (calc) — ABNORMAL HIGH
Non-HDL Cholesterol (Calc): 179 mg/dL (calc) — ABNORMAL HIGH (ref <130)
Total CHOL/HDL Ratio: 4.9 (calc) (ref <5.0)
Triglycerides: 143 mg/dL (ref <150)

## 2018-10-13 LAB — TSH: TSH: 1.31 mIU/L (ref 0.40–4.50)

## 2018-10-13 MED ORDER — OLMESARTAN MEDOXOMIL 40 MG PO TABS: 40.0000 mg | ORAL_TABLET | Freq: Every day | ORAL | Status: DC

## 2018-10-13 MED ORDER — AMLODIPINE BESYLATE 5 MG PO TABS: 5.0000 mg | ORAL_TABLET | Freq: Every day | ORAL | 1 refills | Status: DC

## 2018-10-13 MED ORDER — GLIMEPIRIDE 2 MG PO TABS: 2.0000 mg | ORAL_TABLET | Freq: Every day | ORAL | 3 refills | Status: DC

## 2018-10-13 NOTE — Patient Instructions (Addendum)
Diabetes Basics  Diabetes (diabetes mellitus) is a long-term (chronic) disease. It occurs when the body does not properly use sugar (glucose) that is released from food after you eat. Diabetes may be caused by one or both of these problems:  Your pancreas does not make enough of a hormone called insulin.  Your body does not react in a normal way to insulin that it makes. Insulin lets sugars (glucose) go into cells in your body. This gives you energy. If you have diabetes, sugars cannot get into cells. This causes high blood sugar (hyperglycemia). Follow these instructions at home: How is diabetes treated? You may need to take insulin or other diabetes medicines daily to keep your blood sugar in balance. Take your diabetes medicines every day as told by your doctor. List your diabetes medicines here: Diabetes medicines  Name of medicine: ___amayrl ___________________________ ? Amount (dose): ____2 mg__________ Time (a.m./p.m.): ______with first meal_________ Notes: ___________________________________  Name of medicine: ______________________________ ? Amount (dose): _______________ Time (a.m./p.m.): _______________ Notes: ___________________________________  Name of medicine: ______________________________ ? Amount (dose): _______________ Time (a.m./p.m.): _______________ Notes: ___________________________________ If you use insulin, you will learn how to give yourself insulin by injection. You may need to adjust the amount based on the food that you eat. List the types of insulin you use here: Insulin  Insulin type: ______________________________ ? Amount (dose): _______________ Time (a.m./p.m.): _______________ Notes: ___________________________________  Insulin type: ______________________________ ? Amount (dose): _______________ Time (a.m./p.m.): _______________ Notes: ___________________________________  Insulin type: ______________________________ ? Amount (dose): _______________  Time (a.m./p.m.): _______________ Notes: ___________________________________  Insulin type: ______________________________ ? Amount (dose): _______________ Time (a.m./p.m.): _______________ Notes: ___________________________________  Insulin type: ______________________________ ? Amount (dose): _______________ Time (a.m./p.m.): _______________ Notes: ___________________________________ How do I manage my blood sugar?  Check your blood sugar levels using a blood glucose monitor as directed by your doctor. Your doctor will set treatment goals for you. Generally, you should have these blood sugar levels:  Before meals (preprandial): 80-130 mg/dL (4.4-7.2 mmol/L).  After meals (postprandial): below 180 mg/dL (10 mmol/L).  A1c level: less than 7%. Write down the times that you will check your blood sugar levels: Blood sugar checks  Time: _____fasting__________ Notes: ___________________________________  Time: _______________ Notes: ___________________________________  Time: _______________ Notes: ___________________________________  Time: _______________ Notes: ___________________________________  Time: _______________ Notes: ___________________________________  Time: _______________ Notes: ___________________________________  What do I need to know about low blood sugar? Low blood sugar is called hypoglycemia. This is when blood sugar is at or below 70 mg/dL (3.9 mmol/L). Symptoms may include:  Feeling: ? Hungry. ? Worried or nervous (anxious). ? Sweaty and clammy. ? Confused. ? Dizzy. ? Sleepy. ? Sick to your stomach (nauseous).  Having: ? A fast heartbeat. ? A headache. ? A change in your vision. ? Tingling or no feeling (numbness) around the mouth, lips, or tongue. ? Jerky movements that you cannot control (seizure).  Having trouble with: ? Moving (coordination). ? Sleeping. ? Passing out (fainting). ? Getting upset easily (irritability). Treating low blood  sugar To treat low blood sugar, eat or drink something sugary right away. If you can think clearly and swallow safely, follow the 15:15 rule:  Take 15 grams of a fast-acting carb (carbohydrate). Talk with your doctor about how much you should take.  Some fast-acting carbs are: ? Sugar tablets (glucose pills). Take 3-4 glucose pills. ? 6-8 pieces of hard candy. ? 4-6 oz (120-150 mL) of fruit juice. ? 4-6 oz (120-150 mL) of regular (not diet) soda. ? 1 Tbsp (15 mL)  honey or sugar.  Check your blood sugar 15 minutes after you take the carb.  If your blood sugar is still at or below 70 mg/dL (3.9 mmol/L), take 15 grams of a carb again.  If your blood sugar does not go above 70 mg/dL (3.9 mmol/L) after 3 tries, get help right away.  After your blood sugar goes back to normal, eat a meal or a snack within 1 hour. Treating very low blood sugar If your blood sugar is at or below 54 mg/dL (3 mmol/L), you have very low blood sugar (severe hypoglycemia). This is an emergency. Do not wait to see if the symptoms will go away. Get medical help right away. Call your local emergency services (911 in the [place] Do not drive yourself to the hospital. Questions to ask your health care provider  Do I need to meet with a diabetes educator?  What equipment will I need to care for myself at home?  What diabetes medicines do I need? When should I take them?  How often do I need to check my blood sugar?  What number can I call if I have questions?  When is my next doctor's visit?  Where can I find a support group for people with diabetes? Where to find more information  American Diabetes Association: www.diabetes.org  American Association of Diabetes Educators: www.diabeteseducator.org/patient-resources Contact a doctor if:  Your blood sugar is at or above 240 mg/dL (13.3 mmol/L) for 2 days in a row.  You have been sick or have had a fever for 2 days or more, and you are not getting better.   You have any of these problems for more than 6 hours: ? You cannot eat or drink. ? You feel sick to your stomach (nauseous). ? You throw up (vomit). ? You have watery poop (diarrhea). Get help right away if:  Your blood sugar is lower than 54 mg/dL (3 mmol/L).  You get confused.  You have trouble: ? Thinking clearly. ? Breathing. Summary  Diabetes (diabetes mellitus) is a long-term (chronic) disease. It occurs when the body does not properly use sugar (glucose) that is released from food after digestion.  Take insulin and diabetes medicines as told.  Check your blood sugar every day, as often as told.  Keep all follow-up visits as told by your doctor. This is important. This information is not intended to replace advice given to you by your health care provider. Make sure you discuss any questions you have with your health care provider. Document Released: 05/08/2017 Document Revised: 03/26/2018 Document Reviewed: 05/08/2017 Elsevier Patient Education  2020 [employer]. Diabetic education referral Pt to schedule for eye appt Fasting blood work at [place] diabetic medication to [facility] 2mg  Add hypertension medication -amlodipine Take blood pressure readings first thing in the morning Take glucose readings fasting and if dizziness, headache, light headed or other concerns
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
## 2018-10-26 ENCOUNTER — Telehealth: Payer: Self-pay

## 2018-10-26 ENCOUNTER — Telehealth: Payer: Self-pay | Admitting: Family Medicine

## 2018-10-26 NOTE — Telephone Encounter (Signed)
Patient is calling and says he needs a refill on Jardiance. He says Dr. Holly Bodily changed this medication to a different kind and he is unsure what that was but his insurance has not approved it therefore he is requesting a refill on jardiance.

## 2018-10-27 NOTE — Telephone Encounter (Signed)
Samuel Hubbard, CMA  

## 2018-10-27 NOTE — Telephone Encounter (Signed)
Dr. Holly Bodily put him on Glimipiride instead I got that medication approved and the pharmacy filled it for him, Ill give him a call to inform him

## 2018-11-08 ENCOUNTER — Encounter: Payer: Self-pay | Admitting: Family Medicine

## 2018-11-08 DIAGNOSIS — R3121 Asymptomatic microscopic hematuria: Secondary | ICD-10-CM | POA: Diagnosis not present

## 2018-11-08 DIAGNOSIS — N202 Calculus of kidney with calculus of ureter: Secondary | ICD-10-CM | POA: Diagnosis not present

## 2018-11-08 DIAGNOSIS — N200 Calculus of kidney: Secondary | ICD-10-CM | POA: Diagnosis not present

## 2018-11-08 DIAGNOSIS — N210 Calculus in bladder: Secondary | ICD-10-CM | POA: Diagnosis not present

## 2018-11-10 ENCOUNTER — Encounter: Payer: Self-pay | Admitting: Nutrition

## 2018-11-10 ENCOUNTER — Other Ambulatory Visit: Payer: Self-pay

## 2018-11-10 ENCOUNTER — Encounter: Payer: BC Managed Care – PPO | Attending: Family Medicine | Admitting: Nutrition

## 2018-11-10 VITALS — Ht 73.0 in | Wt 243.0 lb

## 2018-11-10 DIAGNOSIS — E782 Mixed hyperlipidemia: Secondary | ICD-10-CM | POA: Diagnosis not present

## 2018-11-10 DIAGNOSIS — E119 Type 2 diabetes mellitus without complications: Secondary | ICD-10-CM | POA: Diagnosis not present

## 2018-11-10 DIAGNOSIS — I10 Essential (primary) hypertension: Secondary | ICD-10-CM | POA: Diagnosis not present

## 2018-11-10 NOTE — Progress Notes (Signed)
  Medical Nutrition Therapy:  Appt start time: 1600 end time:  1700.   Assessment:  Primary concerns today: DIabetes Type 2 and Hyperlipidemia.  Married. Wife cooks and shops. Not on cholesterol meds yet. Appt with Dr. Holly Bodily, PCP next week. Eats 2-3 meals per day.  Still working as a Librarian, academic. Eats randomly. Admits to eating sweets and junk food. Doesn't like a lot of fruits or vegetables. Not exercising. Willing to work on making changes with diet and exercise to improve DM and lose weight. Glimperide 2 mg before breakfast. Hasn't been testing blood sugars much.    Lab Results  Component Value Date   HGBA1C 6.9 (H) 07/14/2018   CMP Latest Ref Rng & Units 10/13/2018 07/14/2018  Glucose 65 - 99 mg/dL 122(H) 122(H)  BUN 7 - 25 mg/dL 27(H) 25(H)  Creatinine 0.70 - 1.25 mg/dL 1.10 1.55(H)  Sodium 135 - 146 mmol/L 140 138  Potassium 3.5 - 5.3 mmol/L 3.8 4.2  Chloride 98 - 110 mmol/L 102 100  CO2 20 - 32 mmol/L 33(H) 30  Calcium 8.6 - 10.3 mg/dL 9.3 9.0  Total Protein 6.1 - 8.1 g/dL 6.3 -  Total Bilirubin 0.2 - 1.2 mg/dL 0.3 -  AST 10 - 35 U/L 14 -  ALT 9 - 46 U/L 23 -     Preferred Learning Style:    No preference indicated   Learning Readiness:   Ready  Change in progress   MEDICATIONS:    DIETARY INTAKE:     24-hr recall:  B ( AM): cookies choc chips small pack.  Unsweet tea Snk ( AM):  L ( PM): Mcdona'ds, cheeseburger and fries,water  Snk ( PM): D ( PM):  Roastbeef sandwich, 2, water Snk ( PM): snack: blueberry muffin Beverages: water.  Usual physical activity: [employer] on job  Estimated energy needs: 1800  calories 200 g carbohydrates 135 g protein 50 g fat  Progress Towards Goal(s):  In progress.   Nutritional Diagnosis:  NB-1.1 Food and nutrition-related knowledge deficit As related to DIabetes.  As evidenced by A1C 6.9%.    Intervention: Nutrition and Diabetes education provided on My Plate, CHO counting, meal planning, portion sizes, timing of meals,  avoiding snacks between meals unless having a low blood sugar, target ranges for A1C and blood sugars, signs/symptoms and treatment of hyper/hypoglycemia, monitoring blood sugars, taking medications as prescribed, benefits of exercising 30 minutes per day and prevention of complications of DM. Low Cholesterol diet. Goals Follow My Plate  Cut out sweets Increase exercise to 30 minutes three times per week. Test BS  In am and occasionally before bed Get A1C to 6% or less Lose 1-2 lbs per week   Teaching Method Utilized:  Visual Auditory Hands on  Handouts given during visit include:  The Plate Method   Meal Plan Card  Diabetes Instructions.   Barriers to learning/adherence to lifestyle change: none  Demonstrated degree of understanding via:  Teach Back   Monitoring/Evaluation:  Dietary intake, exercise, , and body weight in 1 month(s).

## 2018-11-10 NOTE — Patient Instructions (Signed)
Goals Follow My Plate  Cut out sweets Increase exercise to 30 minutes three times per week. Test BS  In am and occasionally before bed Get A1C to 6% or less Lose 1-2 lbs per week

## 2018-11-15 ENCOUNTER — Ambulatory Visit: Payer: BC Managed Care – PPO | Admitting: Family Medicine

## 2018-11-15 DIAGNOSIS — K760 Fatty (change of) liver, not elsewhere classified: Secondary | ICD-10-CM | POA: Diagnosis not present

## 2018-11-15 DIAGNOSIS — N281 Cyst of kidney, acquired: Secondary | ICD-10-CM | POA: Diagnosis not present

## 2018-11-17 ENCOUNTER — Other Ambulatory Visit: Payer: Self-pay

## 2018-11-17 ENCOUNTER — Ambulatory Visit (INDEPENDENT_AMBULATORY_CARE_PROVIDER_SITE_OTHER): Payer: BC Managed Care – PPO | Admitting: Family Medicine

## 2018-11-17 VITALS — BP 143/87 | HR 79 | Temp 98.4°F | Ht 73.0 in | Wt 249.2 lb

## 2018-11-17 DIAGNOSIS — I10 Essential (primary) hypertension: Secondary | ICD-10-CM

## 2018-11-17 DIAGNOSIS — E782 Mixed hyperlipidemia: Secondary | ICD-10-CM

## 2018-11-17 DIAGNOSIS — E119 Type 2 diabetes mellitus without complications: Secondary | ICD-10-CM | POA: Diagnosis not present

## 2018-11-17 NOTE — Patient Instructions (Signed)
Diet modification -lower cholesterol diet-recheck in 6 months A1c 3 months and repeat blood pressure Take blood pressure at home -first thing in the morning
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
## 2018-12-01 ENCOUNTER — Encounter: Payer: Self-pay | Admitting: Family Medicine

## 2018-12-01 ENCOUNTER — Other Ambulatory Visit: Payer: Self-pay

## 2018-12-01 ENCOUNTER — Ambulatory Visit (INDEPENDENT_AMBULATORY_CARE_PROVIDER_SITE_OTHER): Payer: BC Managed Care – PPO | Admitting: Family Medicine

## 2018-12-01 VITALS — BP 137/82 | HR 87 | Temp 98.7°F | Ht 73.0 in | Wt 247.2 lb

## 2018-12-01 DIAGNOSIS — R52 Pain, unspecified: Secondary | ICD-10-CM | POA: Insufficient documentation

## 2018-12-01 DIAGNOSIS — I10 Essential (primary) hypertension: Secondary | ICD-10-CM | POA: Diagnosis not present

## 2018-12-01 DIAGNOSIS — N200 Calculus of kidney: Secondary | ICD-10-CM | POA: Diagnosis not present

## 2018-12-01 MED ORDER — CYCLOBENZAPRINE HCL 10 MG PO TABS: 10.0000 mg | ORAL_TABLET | Freq: Every day | ORAL | 5 refills | Status: DC
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
## 2018-12-01 NOTE — Patient Instructions (Signed)
Voltaren cream Flexeril at night-1/2-1 tablet Stretches Heat

## 2018-12-02 ENCOUNTER — Telehealth: Payer: Self-pay | Admitting: Family Medicine

## 2018-12-02 NOTE — Telephone Encounter (Signed)
Patient calling and was seen yesterday 12/01/18. He would like to know if its possible that he has diverticulitis

## 2018-12-02 NOTE — Telephone Encounter (Signed)
Unlikely-usually pain in left lower quad of abdomen

## 2018-12-02 NOTE — Telephone Encounter (Signed)
Routing to Dr. Corum for Advice? 

## 2018-12-03 NOTE — Telephone Encounter (Signed)
Patient is aware 

## 2018-12-15 ENCOUNTER — Ambulatory Visit: Payer: BC Managed Care – PPO | Admitting: Nutrition

## 2018-12-15 ENCOUNTER — Telehealth: Payer: Self-pay | Admitting: Nutrition

## 2018-12-15 NOTE — Telephone Encounter (Signed)
vm left to call back to complete phone visit.

## 2018-12-20 DIAGNOSIS — N281 Cyst of kidney, acquired: Secondary | ICD-10-CM | POA: Diagnosis not present

## 2018-12-20 DIAGNOSIS — N202 Calculus of kidney with calculus of ureter: Secondary | ICD-10-CM | POA: Diagnosis not present

## 2019-01-04 ENCOUNTER — Telehealth: Payer: Self-pay

## 2019-01-04 ENCOUNTER — Telehealth: Payer: Self-pay | Admitting: Family Medicine

## 2019-01-04 DIAGNOSIS — I10 Essential (primary) hypertension: Secondary | ICD-10-CM

## 2019-01-04 DIAGNOSIS — E782 Mixed hyperlipidemia: Secondary | ICD-10-CM

## 2019-01-04 MED ORDER — OLMESARTAN MEDOXOMIL 40 MG PO TABS: 40.0000 mg | ORAL_TABLET | Freq: Every day | ORAL | Status: DC

## 2019-01-04 MED ORDER — SILDENAFIL CITRATE 50 MG PO TABS: 50.0000 mg | ORAL_TABLET | Freq: Every day | ORAL | 0 refills | Status: DC | PRN

## 2019-01-04 MED ORDER — HYDROCHLOROTHIAZIDE 12.5 MG PO TABS: 12.5000 mg | ORAL_TABLET | Freq: Every day | ORAL | 0 refills | Status: DC

## 2019-01-04 MED ORDER — AMLODIPINE BESYLATE 5 MG PO TABS: 5.0000 mg | ORAL_TABLET | Freq: Every day | ORAL | 1 refills | Status: DC

## 2019-01-04 NOTE — Telephone Encounter (Signed)
Done

## 2019-01-04 NOTE — Telephone Encounter (Signed)
Leighann Lemmie Vanlanen, CMA 

## 2019-01-04 NOTE — Telephone Encounter (Signed)
Patient is calling to check on the message below.

## 2019-01-04 NOTE — Telephone Encounter (Signed)
LeighAnn Kert Shackett, CMA  

## 2019-01-04 NOTE — Telephone Encounter (Signed)
[facility] is calling and states the patient is going out of town tomorrow and is needing a refill on the following medications.  amLODipine (NORVASC) 5 MG tablet sildenafil (VIAGRA) 50 MG tablet  HCTZ 12.5MG   [facility] - [place], [place] - [place] [phone] (Phone) [phone] (Fax)

## 2019-02-02 ENCOUNTER — Emergency Department (HOSPITAL_COMMUNITY)
Admission: EM | Admit: 2019-02-02 | Discharge: 2019-02-02 | Disposition: A | Discharge status: Home / Self Care | Payer: Worker's Compensation | Attending: Emergency Medicine | Admitting: Emergency Medicine

## 2019-02-02 ENCOUNTER — Other Ambulatory Visit: Payer: Self-pay

## 2019-02-02 ENCOUNTER — Encounter (HOSPITAL_COMMUNITY): Payer: Self-pay | Admitting: *Deleted

## 2019-02-02 DIAGNOSIS — Y929 Unspecified place or not applicable: Secondary | ICD-10-CM | POA: Insufficient documentation

## 2019-02-02 DIAGNOSIS — W228XXA Striking against or struck by other objects, initial encounter: Secondary | ICD-10-CM | POA: Diagnosis not present

## 2019-02-02 DIAGNOSIS — I10 Essential (primary) hypertension: Secondary | ICD-10-CM | POA: Diagnosis not present

## 2019-02-02 DIAGNOSIS — S0592XA Unspecified injury of left eye and orbit, initial encounter: Secondary | ICD-10-CM

## 2019-02-02 DIAGNOSIS — Z79899 Other long term (current) drug therapy: Secondary | ICD-10-CM | POA: Insufficient documentation

## 2019-02-02 DIAGNOSIS — S00212A Abrasion of left eyelid and periocular area, initial encounter: Secondary | ICD-10-CM | POA: Insufficient documentation

## 2019-02-02 DIAGNOSIS — Y9389 Activity, other specified: Secondary | ICD-10-CM | POA: Diagnosis not present

## 2019-02-02 DIAGNOSIS — E039 Hypothyroidism, unspecified: Secondary | ICD-10-CM | POA: Diagnosis not present

## 2019-02-02 DIAGNOSIS — S0083XA Contusion of other part of head, initial encounter: Secondary | ICD-10-CM | POA: Diagnosis not present

## 2019-02-02 DIAGNOSIS — Y999 Unspecified external cause status: Secondary | ICD-10-CM | POA: Diagnosis not present

## 2019-02-02 DIAGNOSIS — S0993XA Unspecified injury of face, initial encounter: Secondary | ICD-10-CM | POA: Diagnosis present

## 2019-02-02 DIAGNOSIS — E119 Type 2 diabetes mellitus without complications: Secondary | ICD-10-CM | POA: Diagnosis not present
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
## 2019-02-02 NOTE — ED Notes (Signed)
Attempted visual acuity testing with patient. He states he can only read the E with either eye but reports that is his normal. He denies changes to his vision.

## 2019-02-02 NOTE — Discharge Instructions (Addendum)
Please remove your contacts and wear your glasses until seen by ophthalmology for recheck of eye injury. Tylenol for any discomfort. Follow up with your eye doctor for recheck in the next 24-48 hours. A referral to Dr. Satira Sark (in [place]) has been provided if your doctor is unavailable.   Return to the ED with any new or worsening symptoms.

## 2019-02-04 DIAGNOSIS — H4312 Vitreous hemorrhage, left eye: Secondary | ICD-10-CM | POA: Diagnosis not present

## 2019-02-04 DIAGNOSIS — H3562 Retinal hemorrhage, left eye: Secondary | ICD-10-CM | POA: Diagnosis not present

## 2019-02-04 DIAGNOSIS — H43821 Vitreomacular adhesion, right eye: Secondary | ICD-10-CM | POA: Diagnosis not present

## 2019-02-04 DIAGNOSIS — C6932 Malignant neoplasm of left choroid: Secondary | ICD-10-CM | POA: Diagnosis not present

## 2019-02-08 DIAGNOSIS — H4312 Vitreous hemorrhage, left eye: Secondary | ICD-10-CM | POA: Diagnosis not present

## 2019-02-08 DIAGNOSIS — C6932 Malignant neoplasm of left choroid: Secondary | ICD-10-CM | POA: Diagnosis not present

## 2019-02-16 ENCOUNTER — Encounter: Payer: Self-pay | Admitting: Family Medicine

## 2019-02-16 ENCOUNTER — Telehealth (INDEPENDENT_AMBULATORY_CARE_PROVIDER_SITE_OTHER): Payer: BC Managed Care – PPO | Admitting: Family Medicine

## 2019-02-16 ENCOUNTER — Other Ambulatory Visit: Payer: Self-pay

## 2019-02-16 VITALS — BP 137/82 | Ht 73.0 in | Wt 247.0 lb

## 2019-02-16 DIAGNOSIS — E119 Type 2 diabetes mellitus without complications: Secondary | ICD-10-CM | POA: Diagnosis not present

## 2019-02-16 DIAGNOSIS — I10 Essential (primary) hypertension: Secondary | ICD-10-CM | POA: Diagnosis not present

## 2019-02-16 DIAGNOSIS — E782 Mixed hyperlipidemia: Secondary | ICD-10-CM | POA: Diagnosis not present

## 2019-02-16 MED ORDER — GLIMEPIRIDE 2 MG PO TABS: 2.0000 mg | ORAL_TABLET | Freq: Every day | ORAL | 1 refills | Status: DC

## 2019-02-16 MED ORDER — AMLODIPINE BESYLATE 5 MG PO TABS: 5.0000 mg | ORAL_TABLET | Freq: Every day | ORAL | 1 refills | Status: DC

## 2019-02-16 MED ORDER — LEVOTHYROXINE SODIUM 112 MCG PO TABS: ORAL_TABLET | ORAL | 3 refills | Status: DC

## 2019-02-17 NOTE — Progress Notes (Signed)
Virtual Visit via Telephone Note  I connected with Samuel Hubbard on 02/16/19 at  3:40 PM EST by telephone and verified that I am speaking with the correct person using two identifiers.DOB/Address  Location: Patient: home Provider: office   I discussed the limitations, risks, security and privacy concerns of performing an evaluation and management service by telephone and the availability of in person appointments. I also discussed with the patient that there may be a patient responsible charge related to this service. The patient expressed understanding and agreed to proceed. Eye evaluation: 63 y.o. 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 Pt had recent blunt trauma OS, evaluated by outside Ophthalmologist, who referred pt to Dr. Jule Ser for a choroidal lesion OS. Dr. Jule Ser referred to [place]. Last eye exam prior to this was in 2019, at which point a nevus was noted, but only a hand-drawing was obtained (no photos) - noted to be flat.  Wife with recent back surgery-successful History of Present Illness: Pt with eye injury-seen at [facility]-cancer noted on exam-procedures to be completed at [facility]  pt needs refills amlodipine/amaryl. Stable on home readings Observations/Objective: Reviewed labwork-A1c 6.9, renal function normal  Assessment and Plan:  1. Mixed hyperlipidemia - amLODipine (NORVASC) 5 MG tablet; Take 1 tablet (5 mg total) by mouth daily.  Dispense: 90 tablet; Refill: 1 No meds currently 2. Essential hypertension Stable readings at doctor appt-will be monitored with eye procdures - amLODipine (NORVASC) 5 MG tablet; Take 1 tablet (5 mg total) by mouth daily.  Dispense: 90 tablet; Refill: 1 Take readings daily at home. Reviewed renal function 3. Diabetes mellitus without complication ([place]) Continue amaryl could not tolerate metformin Follow Up Instructions: Glucose readings fasting Morning bp readings   I discussed the assessment and treatment plan  with the patient. The patient was provided an opportunity to ask questions and all were answered. The patient agreed with the plan and demonstrated an understanding of the instructions.   The patient was advised to call back or seek an in-person evaluation if the symptoms worsen or if the condition fails to improve as anticipated.  I provided  12 minutes of non-face-to-face time during this encounter.   Samuel Hubbard Hannah Beat, MD

## 2019-02-22 DIAGNOSIS — S0512XA Contusion of eyeball and orbital tissues, left eye, initial encounter: Secondary | ICD-10-CM | POA: Diagnosis not present

## 2019-02-22 DIAGNOSIS — H538 Other visual disturbances: Secondary | ICD-10-CM | POA: Diagnosis not present

## 2019-02-22 DIAGNOSIS — H2513 Age-related nuclear cataract, bilateral: Secondary | ICD-10-CM | POA: Diagnosis not present

## 2019-03-05 DIAGNOSIS — C6932 Malignant neoplasm of left choroid: Secondary | ICD-10-CM | POA: Diagnosis not present

## 2019-03-05 DIAGNOSIS — N289 Disorder of kidney and ureter, unspecified: Secondary | ICD-10-CM | POA: Diagnosis not present

## 2019-03-10 DIAGNOSIS — C6930 Malignant neoplasm of unspecified choroid: Secondary | ICD-10-CM | POA: Diagnosis not present

## 2019-03-14 DIAGNOSIS — C6932 Malignant neoplasm of left choroid: Secondary | ICD-10-CM | POA: Diagnosis not present

## 2019-03-14 DIAGNOSIS — Z20822 Contact with and (suspected) exposure to covid-19: Secondary | ICD-10-CM | POA: Diagnosis not present

## 2019-03-17 DIAGNOSIS — Z7984 Long term (current) use of oral hypoglycemic drugs: Secondary | ICD-10-CM | POA: Diagnosis not present

## 2019-03-17 DIAGNOSIS — E039 Hypothyroidism, unspecified: Secondary | ICD-10-CM | POA: Diagnosis not present

## 2019-03-17 DIAGNOSIS — Z9119 Patient's noncompliance with other medical treatment and regimen: Secondary | ICD-10-CM | POA: Diagnosis not present

## 2019-03-17 DIAGNOSIS — E119 Type 2 diabetes mellitus without complications: Secondary | ICD-10-CM | POA: Diagnosis not present

## 2019-03-17 DIAGNOSIS — Z79899 Other long term (current) drug therapy: Secondary | ICD-10-CM | POA: Diagnosis not present

## 2019-03-17 DIAGNOSIS — C6932 Malignant neoplasm of left choroid: Secondary | ICD-10-CM | POA: Diagnosis not present

## 2019-03-17 DIAGNOSIS — M1990 Unspecified osteoarthritis, unspecified site: Secondary | ICD-10-CM | POA: Diagnosis not present

## 2019-03-17 DIAGNOSIS — I10 Essential (primary) hypertension: Secondary | ICD-10-CM | POA: Diagnosis not present

## 2019-03-17 DIAGNOSIS — C6930 Malignant neoplasm of unspecified choroid: Secondary | ICD-10-CM | POA: Diagnosis not present

## 2019-03-17 DIAGNOSIS — G4730 Sleep apnea, unspecified: Secondary | ICD-10-CM | POA: Diagnosis not present

## 2019-03-17 DIAGNOSIS — E896 Postprocedural adrenocortical (-medullary) hypofunction: Secondary | ICD-10-CM | POA: Diagnosis not present

## 2019-03-17 DIAGNOSIS — Z9911 Dependence on respirator [ventilator] status: Secondary | ICD-10-CM | POA: Diagnosis not present

## 2019-03-18 DIAGNOSIS — Z20822 Contact with and (suspected) exposure to covid-19: Secondary | ICD-10-CM | POA: Diagnosis not present

## 2019-03-18 DIAGNOSIS — C6932 Malignant neoplasm of left choroid: Secondary | ICD-10-CM | POA: Diagnosis not present

## 2019-03-21 DIAGNOSIS — Z79890 Hormone replacement therapy (postmenopausal): Secondary | ICD-10-CM | POA: Diagnosis not present

## 2019-03-21 DIAGNOSIS — Z7984 Long term (current) use of oral hypoglycemic drugs: Secondary | ICD-10-CM | POA: Diagnosis not present

## 2019-03-21 DIAGNOSIS — I10 Essential (primary) hypertension: Secondary | ICD-10-CM | POA: Diagnosis not present

## 2019-03-21 DIAGNOSIS — E039 Hypothyroidism, unspecified: Secondary | ICD-10-CM | POA: Diagnosis not present

## 2019-03-21 DIAGNOSIS — C6930 Malignant neoplasm of unspecified choroid: Secondary | ICD-10-CM | POA: Diagnosis not present

## 2019-03-21 DIAGNOSIS — Z9989 Dependence on other enabling machines and devices: Secondary | ICD-10-CM | POA: Diagnosis not present

## 2019-03-21 DIAGNOSIS — Z87442 Personal history of urinary calculi: Secondary | ICD-10-CM | POA: Diagnosis not present

## 2019-03-21 DIAGNOSIS — E119 Type 2 diabetes mellitus without complications: Secondary | ICD-10-CM | POA: Diagnosis not present

## 2019-03-21 DIAGNOSIS — C6932 Malignant neoplasm of left choroid: Secondary | ICD-10-CM | POA: Diagnosis not present

## 2019-03-21 DIAGNOSIS — G4730 Sleep apnea, unspecified: Secondary | ICD-10-CM | POA: Diagnosis not present

## 2019-03-21 DIAGNOSIS — M1990 Unspecified osteoarthritis, unspecified site: Secondary | ICD-10-CM | POA: Diagnosis not present

## 2019-03-21 DIAGNOSIS — Z79899 Other long term (current) drug therapy: Secondary | ICD-10-CM | POA: Diagnosis not present

## 2019-03-28 DIAGNOSIS — C6932 Malignant neoplasm of left choroid: Secondary | ICD-10-CM | POA: Diagnosis not present

## 2019-03-30 DIAGNOSIS — C6932 Malignant neoplasm of left choroid: Secondary | ICD-10-CM | POA: Diagnosis not present

## 2019-04-12 ENCOUNTER — Other Ambulatory Visit: Payer: Self-pay

## 2019-04-12 DIAGNOSIS — I10 Essential (primary) hypertension: Secondary | ICD-10-CM

## 2019-04-12 DIAGNOSIS — E782 Mixed hyperlipidemia: Secondary | ICD-10-CM

## 2019-04-12 MED ORDER — OLMESARTAN MEDOXOMIL 40 MG PO TABS: 40.0000 mg | ORAL_TABLET | Freq: Every day | ORAL | 1 refills | Status: DC

## 2019-04-13 ENCOUNTER — Other Ambulatory Visit: Payer: Self-pay

## 2019-04-13 DIAGNOSIS — E782 Mixed hyperlipidemia: Secondary | ICD-10-CM

## 2019-04-13 DIAGNOSIS — I10 Essential (primary) hypertension: Secondary | ICD-10-CM

## 2019-04-13 MED ORDER — OLMESARTAN MEDOXOMIL 40 MG PO TABS: 40.0000 mg | ORAL_TABLET | Freq: Every day | ORAL | 1 refills | Status: DC

## 2019-04-20 DIAGNOSIS — H527 Unspecified disorder of refraction: Secondary | ICD-10-CM | POA: Diagnosis not present

## 2019-05-20 ENCOUNTER — Encounter (HOSPITAL_COMMUNITY): Payer: Self-pay | Admitting: Emergency Medicine

## 2019-05-20 ENCOUNTER — Other Ambulatory Visit: Payer: Self-pay

## 2019-05-20 ENCOUNTER — Emergency Department (HOSPITAL_COMMUNITY)
Admission: EM | Admit: 2019-05-20 | Discharge: 2019-05-20 | Disposition: A | Discharge status: Home / Self Care | Payer: BC Managed Care – PPO | Attending: Emergency Medicine | Admitting: Emergency Medicine

## 2019-05-20 ENCOUNTER — Emergency Department (HOSPITAL_COMMUNITY): Payer: BC Managed Care – PPO

## 2019-05-20 DIAGNOSIS — Z79899 Other long term (current) drug therapy: Secondary | ICD-10-CM | POA: Insufficient documentation

## 2019-05-20 DIAGNOSIS — W11XXXA Fall on and from ladder, initial encounter: Secondary | ICD-10-CM | POA: Diagnosis not present

## 2019-05-20 DIAGNOSIS — W19XXXA Unspecified fall, initial encounter: Secondary | ICD-10-CM

## 2019-05-20 DIAGNOSIS — M549 Dorsalgia, unspecified: Secondary | ICD-10-CM | POA: Diagnosis not present

## 2019-05-20 DIAGNOSIS — E039 Hypothyroidism, unspecified: Secondary | ICD-10-CM | POA: Diagnosis not present

## 2019-05-20 DIAGNOSIS — E119 Type 2 diabetes mellitus without complications: Secondary | ICD-10-CM | POA: Insufficient documentation

## 2019-05-20 DIAGNOSIS — I10 Essential (primary) hypertension: Secondary | ICD-10-CM | POA: Insufficient documentation

## 2019-05-20 DIAGNOSIS — M545 Low back pain: Secondary | ICD-10-CM | POA: Diagnosis not present

## 2019-05-20 MED ORDER — HYDROMORPHONE HCL 1 MG/ML IJ SOLN
1.0000 mg | Freq: Once | INTRAMUSCULAR | Status: AC
  Administered 2019-05-20: 1 mg via INTRAMUSCULAR
  Filled 2019-05-20: qty 1

## 2019-05-20 MED ORDER — CYCLOBENZAPRINE HCL 10 MG PO TABS: 10.0000 mg | ORAL_TABLET | Freq: Two times a day (BID) | ORAL | 0 refills | Status: DC | PRN

## 2019-05-20 MED ORDER — OXYCODONE-ACETAMINOPHEN 5-325 MG PO TABS: 1.0000 | ORAL_TABLET | Freq: Four times a day (QID) | ORAL | 0 refills | Status: DC | PRN

## 2019-05-20 NOTE — ED Triage Notes (Signed)
Patient denies any loss of consciousness.

## 2019-05-20 NOTE — ED Triage Notes (Signed)
Patient states that he is having back pain after falling off a ladder.Patient states that he was in the barn and fell of a ladder. He states that he was about 4 feet up the ladder when he fell. Pain is in the sacral and lumbar region.
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
## 2019-05-20 NOTE — Discharge Instructions (Addendum)
Rest at home to 3 days.  Follow-up with your doctor next week

## 2019-05-20 NOTE — ED Notes (Signed)
Patient taken to xray.

## 2019-05-23 ENCOUNTER — Other Ambulatory Visit: Payer: Self-pay

## 2019-05-23 DIAGNOSIS — E119 Type 2 diabetes mellitus without complications: Secondary | ICD-10-CM

## 2019-05-23 MED ORDER — GLIMEPIRIDE 2 MG PO TABS: 2.0000 mg | ORAL_TABLET | Freq: Every day | ORAL | 1 refills | Status: DC

## 2019-06-16 ENCOUNTER — Other Ambulatory Visit: Payer: Self-pay | Admitting: Emergency Medicine

## 2019-06-16 DIAGNOSIS — E782 Mixed hyperlipidemia: Secondary | ICD-10-CM

## 2019-06-16 DIAGNOSIS — I10 Essential (primary) hypertension: Secondary | ICD-10-CM

## 2019-06-16 MED ORDER — AMLODIPINE BESYLATE 5 MG PO TABS: 5.0000 mg | ORAL_TABLET | Freq: Every day | ORAL | 1 refills | Status: DC

## 2019-06-29 DIAGNOSIS — C6932 Malignant neoplasm of left choroid: Secondary | ICD-10-CM | POA: Diagnosis not present

## 2019-07-12 DIAGNOSIS — E890 Postprocedural hypothyroidism: Secondary | ICD-10-CM | POA: Diagnosis not present

## 2019-07-12 DIAGNOSIS — I10 Essential (primary) hypertension: Secondary | ICD-10-CM | POA: Diagnosis not present

## 2019-07-12 DIAGNOSIS — E1169 Type 2 diabetes mellitus with other specified complication: Secondary | ICD-10-CM | POA: Diagnosis not present

## 2019-07-12 DIAGNOSIS — Z85850 Personal history of malignant neoplasm of thyroid: Secondary | ICD-10-CM | POA: Diagnosis not present

## 2019-07-26 DIAGNOSIS — C6932 Malignant neoplasm of left choroid: Secondary | ICD-10-CM | POA: Diagnosis not present

## 2019-08-04 DIAGNOSIS — C6932 Malignant neoplasm of left choroid: Secondary | ICD-10-CM | POA: Diagnosis not present

## 2019-08-04 DIAGNOSIS — Z923 Personal history of irradiation: Secondary | ICD-10-CM | POA: Diagnosis not present

## 2019-08-04 DIAGNOSIS — Z85840 Personal history of malignant neoplasm of eye: Secondary | ICD-10-CM | POA: Diagnosis not present

## 2019-08-04 DIAGNOSIS — Z08 Encounter for follow-up examination after completed treatment for malignant neoplasm: Secondary | ICD-10-CM | POA: Diagnosis not present

## 2019-08-18 DIAGNOSIS — E1165 Type 2 diabetes mellitus with hyperglycemia: Secondary | ICD-10-CM | POA: Diagnosis not present

## 2019-08-18 DIAGNOSIS — Z85850 Personal history of malignant neoplasm of thyroid: Secondary | ICD-10-CM | POA: Diagnosis not present

## 2019-08-18 DIAGNOSIS — Z86018 Personal history of other benign neoplasm: Secondary | ICD-10-CM | POA: Diagnosis not present

## 2019-08-18 DIAGNOSIS — E890 Postprocedural hypothyroidism: Secondary | ICD-10-CM | POA: Diagnosis not present

## 2019-09-28 DIAGNOSIS — M25551 Pain in right hip: Secondary | ICD-10-CM | POA: Diagnosis not present

## 2019-09-28 DIAGNOSIS — I10 Essential (primary) hypertension: Secondary | ICD-10-CM | POA: Diagnosis not present

## 2019-11-08 DIAGNOSIS — C6932 Malignant neoplasm of left choroid: Secondary | ICD-10-CM | POA: Diagnosis not present

## 2019-12-08 ENCOUNTER — Ambulatory Visit
Admission: EM | Admit: 2019-12-08 | Discharge: 2019-12-08 | Disposition: A | Discharge status: Home / Self Care | Payer: BC Managed Care – PPO | Attending: Emergency Medicine | Admitting: Emergency Medicine

## 2019-12-08 ENCOUNTER — Encounter: Payer: Self-pay | Admitting: Emergency Medicine

## 2019-12-08 ENCOUNTER — Other Ambulatory Visit: Payer: Self-pay

## 2019-12-08 DIAGNOSIS — N481 Balanitis: Secondary | ICD-10-CM

## 2019-12-08 DIAGNOSIS — R21 Rash and other nonspecific skin eruption: Secondary | ICD-10-CM

## 2019-12-08 MED ORDER — CLOTRIMAZOLE 1 % EX CREA: 1.0000 "application " | TOPICAL_CREAM | Freq: Two times a day (BID) | CUTANEOUS | 0 refills | Status: DC
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
## 2019-12-08 NOTE — ED Triage Notes (Signed)
Reports abrasion like rash on his penis from having sex. States he is not concerned for std's he has been with the same partner.

## 2019-12-08 NOTE — Discharge Instructions (Signed)
Ensure genital cleansing Twice daily bathing of the affected area with saline solution can be helpful Keep area dry Clotrimazole 1% cream prescribed.  Use twice daily for 7-14 days Use vaseline or aquafore in between clotrimazole applications as needed Follow up with PCP for recheck Return or go to the ER if you have any new or worsening symptoms such as pain, increased redness, etc..Marland Kitchen

## 2020-02-28 ENCOUNTER — Other Ambulatory Visit: Payer: Self-pay

## 2020-02-28 ENCOUNTER — Ambulatory Visit: Payer: BC Managed Care – PPO | Admitting: Nurse Practitioner

## 2020-02-28 ENCOUNTER — Encounter: Payer: Self-pay | Admitting: Nurse Practitioner

## 2020-02-28 VITALS — BP 176/107 | HR 88 | Temp 98.4°F | Resp 20 | Ht 72.0 in | Wt 248.0 lb

## 2020-02-28 DIAGNOSIS — F419 Anxiety disorder, unspecified: Secondary | ICD-10-CM

## 2020-02-28 DIAGNOSIS — E782 Mixed hyperlipidemia: Secondary | ICD-10-CM

## 2020-02-28 DIAGNOSIS — I10 Essential (primary) hypertension: Secondary | ICD-10-CM

## 2020-02-28 DIAGNOSIS — Z139 Encounter for screening, unspecified: Secondary | ICD-10-CM

## 2020-02-28 DIAGNOSIS — E038 Other specified hypothyroidism: Secondary | ICD-10-CM | POA: Diagnosis not present

## 2020-02-28 DIAGNOSIS — R252 Cramp and spasm: Secondary | ICD-10-CM

## 2020-02-28 DIAGNOSIS — E119 Type 2 diabetes mellitus without complications: Secondary | ICD-10-CM

## 2020-02-28 DIAGNOSIS — Z7689 Persons encountering health services in other specified circumstances: Secondary | ICD-10-CM

## 2020-02-28 MED ORDER — SERTRALINE HCL 50 MG PO TABS
50.0000 mg | ORAL_TABLET | Freq: Every day | ORAL | 3 refills | Status: DC
Start: 2020-02-28 — End: 2020-03-20

## 2020-02-28 MED ORDER — AMLODIPINE BESYLATE 10 MG PO TABS: 10.0000 mg | ORAL_TABLET | Freq: Every day | ORAL | 3 refills | Status: DC

## 2020-02-28 NOTE — Patient Instructions (Signed)
We started sertraline today for anxiety. It takes about a month for you to notice effects of this medication.  That is why we will meet back up in 1 month for a lab follow-up. Please get fasting labs 2-3 days prior to this appointment.  For high blood pressure, I am increasing the dose of your amlodipine.

## 2020-02-28 NOTE — Assessment & Plan Note (Signed)
-  no A1c to review today -takes jardiance 25 mg daily -on arb, but no statin
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
## 2020-02-28 NOTE — Assessment & Plan Note (Signed)
-  GAD-7 = 17 today -he states he started having anxiety when he was diagnosed with eye cancer; it is causing him some irritability -Rx.sertraline

## 2020-02-28 NOTE — Assessment & Plan Note (Signed)
-  to bilateral lower extremities -takes cyclobenzaprine 10 mg BID PRN for muscle spasm, takes this at night for spasms near his knees

## 2020-02-28 NOTE — Assessment & Plan Note (Signed)
-  BP slightly elevated, but he states he has some white coat hypertension since getting eye cancer -takes amlodipine 5 mg daily -takes olmesartan 40 mg daily

## 2020-02-28 NOTE — Assessment & Plan Note (Signed)
-  no labs to review today -will check these with next set of labs

## 2020-02-28 NOTE — Assessment & Plan Note (Signed)
-  no TSH or T4 to review today -takes levothyroxine 224 mcg daily (2x 112 mcg tabs)0

## 2020-02-29 ENCOUNTER — Telehealth: Payer: Self-pay

## 2020-02-29 NOTE — Telephone Encounter (Signed)
Faxed Dr Milagros Evener [facility] for medical records.

## 2020-02-29 NOTE — Telephone Encounter (Signed)
Faxed Dr [facility] for medical records.

## 2020-02-29 NOTE — Telephone Encounter (Signed)
Error office closed.

## 2020-03-01 NOTE — Telephone Encounter (Signed)
error 

## 2020-03-15 ENCOUNTER — Telehealth: Payer: Self-pay

## 2020-03-15 NOTE — Telephone Encounter (Signed)
Pt informed

## 2020-03-15 NOTE — Telephone Encounter (Signed)
Pt wants to know what he should do because his b/p is still running about 152/102 and 144/98.Samuel Hubbard and he is still having headaches. Also has a question about the dose change in the amlodipine did you want him to switch from the 5mg  to the 10mg  or add 10 to the 5?

## 2020-03-15 NOTE — Telephone Encounter (Signed)
Go from 5 to 10. Do not add.

## 2020-03-20 ENCOUNTER — Other Ambulatory Visit: Payer: Self-pay | Admitting: Nurse Practitioner

## 2020-03-20 ENCOUNTER — Telehealth: Payer: Self-pay

## 2020-03-20 MED ORDER — DULOXETINE HCL 30 MG PO CPEP
30.0000 mg | ORAL_CAPSULE | Freq: Every day | ORAL | 0 refills | Status: DC
Start: 2020-03-20 — End: 2020-04-02

## 2020-03-20 NOTE — Telephone Encounter (Signed)
Let's have him stop the sertraline due to side effects. I sent in duloxetine for him. He should start this in a week. We will try this dose for a month. If it works well, we will need to increase the dose to 60 mg per day.  I'm glad his BP has improved!

## 2020-03-20 NOTE — Telephone Encounter (Signed)
Pt states he has been taking the Sertraline since 1/11 and he is having horrible headaches after every time he takes it. He has been checking his bp and today it was 126/83. So his bp is becoming much better.

## 2020-03-21 NOTE — Telephone Encounter (Signed)
Pt informed, states right now he is not going to take anything for right now and try to get all of it out of his system and see how it does without it.

## 2020-03-27 DIAGNOSIS — Z139 Encounter for screening, unspecified: Secondary | ICD-10-CM | POA: Diagnosis not present

## 2020-03-27 DIAGNOSIS — E119 Type 2 diabetes mellitus without complications: Secondary | ICD-10-CM | POA: Diagnosis not present

## 2020-03-27 DIAGNOSIS — I10 Essential (primary) hypertension: Secondary | ICD-10-CM | POA: Diagnosis not present

## 2020-03-27 DIAGNOSIS — E038 Other specified hypothyroidism: Secondary | ICD-10-CM | POA: Diagnosis not present

## 2020-03-27 DIAGNOSIS — E782 Mixed hyperlipidemia: Secondary | ICD-10-CM | POA: Diagnosis not present

## 2020-03-27 DIAGNOSIS — R252 Cramp and spasm: Secondary | ICD-10-CM | POA: Diagnosis not present

## 2020-03-27 DIAGNOSIS — Z7689 Persons encountering health services in other specified circumstances: Secondary | ICD-10-CM | POA: Diagnosis not present

## 2020-03-28 ENCOUNTER — Other Ambulatory Visit: Payer: Self-pay | Admitting: Nurse Practitioner

## 2020-03-28 LAB — TSH+FREE T4
Free T4: 1.48 ng/dL (ref 0.82–1.77)
TSH: 7.65 u[IU]/mL — ABNORMAL HIGH (ref 0.450–4.500)

## 2020-03-28 LAB — LIPID PANEL WITH LDL/HDL RATIO
Cholesterol, Total: 219 mg/dL — ABNORMAL HIGH (ref 100–199)
HDL: 51 mg/dL (ref >39)
LDL Chol Calc (NIH): 147 mg/dL — ABNORMAL HIGH (ref 0–99)
LDL/HDL Ratio: 2.9 ratio (ref 0.0–3.6)
Triglycerides: 119 mg/dL (ref 0–149)
VLDL Cholesterol Cal: 21 mg/dL (ref 5–40)

## 2020-03-28 LAB — CBC WITH DIFFERENTIAL/PLATELET
Basophils Absolute: 0.1 10*3/uL (ref 0.0–0.2)
Basos: 1 %
EOS (ABSOLUTE): 0.1 10*3/uL (ref 0.0–0.4)
Eos: 2 %
Hematocrit: 49.2 % (ref 37.5–51.0)
Hemoglobin: 15.7 g/dL (ref 13.0–17.7)
Immature Grans (Abs): 0 10*3/uL (ref 0.0–0.1)
Immature Granulocytes: 0 %
Lymphocytes Absolute: 1.6 10*3/uL (ref 0.7–3.1)
Lymphs: 29 %
MCH: 27 pg (ref 26.6–33.0)
MCHC: 31.9 g/dL (ref 31.5–35.7)
MCV: 85 fL (ref 79–97)
Monocytes Absolute: 0.5 10*3/uL (ref 0.1–0.9)
Monocytes: 9 %
Neutrophils Absolute: 3.2 10*3/uL (ref 1.4–7.0)
Neutrophils: 59 %
Platelets: 232 10*3/uL (ref 150–450)
RBC: 5.82 x10E6/uL — ABNORMAL HIGH (ref 4.14–5.80)
RDW: 12.6 % (ref 11.6–15.4)
WBC: 5.5 10*3/uL (ref 3.4–10.8)

## 2020-03-28 LAB — CMP14+EGFR
ALT: 22 IU/L (ref 0–44)
AST: 13 IU/L (ref 0–40)
Albumin/Globulin Ratio: 1.8 (ref 1.2–2.2)
Albumin: 4.2 g/dL (ref 3.8–4.8)
Alkaline Phosphatase: 115 IU/L (ref 44–121)
BUN/Creatinine Ratio: 22 (ref 10–24)
BUN: 18 mg/dL (ref 8–27)
Bilirubin Total: 0.2 mg/dL (ref 0.0–1.2)
CO2: 23 mmol/L (ref 20–29)
Calcium: 9.1 mg/dL (ref 8.6–10.2)
Chloride: 101 mmol/L (ref 96–106)
Creatinine, Ser: 0.83 mg/dL (ref 0.76–1.27)
GFR calc Af Amer: 108 mL/min/{1.73_m2} (ref >59)
GFR calc non Af Amer: 94 mL/min/{1.73_m2} (ref >59)
Globulin, Total: 2.3 g/dL (ref 1.5–4.5)
Glucose: 127 mg/dL — ABNORMAL HIGH (ref 65–99)
Potassium: 4.2 mmol/L (ref 3.5–5.2)
Sodium: 140 mmol/L (ref 134–144)
Total Protein: 6.5 g/dL (ref 6.0–8.5)

## 2020-03-28 LAB — HCV AB W/RFLX TO VERIFICATION: HCV Ab: <0.1 s/co ratio (ref 0.0–0.9)

## 2020-03-28 LAB — HIV ANTIBODY (ROUTINE TESTING W REFLEX): HIV Screen 4th Generation wRfx: NONREACTIVE

## 2020-03-28 LAB — HEMOGLOBIN A1C
Est. average glucose Bld gHb Est-mCnc: 166 mg/dL
Hgb A1c MFr Bld: 7.4 % — ABNORMAL HIGH (ref 4.8–5.6)

## 2020-03-28 LAB — HCV INTERPRETATION

## 2020-03-28 MED ORDER — ATORVASTATIN CALCIUM 40 MG PO TABS
40.0000 mg | ORAL_TABLET | Freq: Every day | ORAL | 3 refills | Status: DC
Start: 2020-03-28 — End: 2020-09-24

## 2020-03-28 NOTE — Progress Notes (Signed)
I sent in atorvastatin to help with his elevated cholesterol. He should take this in the evening to reduce the risk of side effects. His A1c and TSH were elevated, but we will discuss those at his appointment on the 14th.

## 2020-03-28 NOTE — Progress Notes (Signed)
-  will discuss TSH and A1c at next appt in a week

## 2020-04-02 ENCOUNTER — Other Ambulatory Visit: Payer: Self-pay

## 2020-04-02 ENCOUNTER — Encounter: Payer: Self-pay | Admitting: Nurse Practitioner

## 2020-04-02 ENCOUNTER — Ambulatory Visit: Payer: BC Managed Care – PPO | Admitting: Nurse Practitioner

## 2020-04-02 DIAGNOSIS — E038 Other specified hypothyroidism: Secondary | ICD-10-CM

## 2020-04-02 DIAGNOSIS — F419 Anxiety disorder, unspecified: Secondary | ICD-10-CM

## 2020-04-02 DIAGNOSIS — E119 Type 2 diabetes mellitus without complications: Secondary | ICD-10-CM

## 2020-04-02 DIAGNOSIS — E782 Mixed hyperlipidemia: Secondary | ICD-10-CM

## 2020-04-02 DIAGNOSIS — I10 Essential (primary) hypertension: Secondary | ICD-10-CM

## 2020-04-02 MED ORDER — LEVOTHYROXINE SODIUM 112 MCG PO TABS: ORAL_TABLET | ORAL | 3 refills | Status: DC

## 2020-04-02 MED ORDER — EMPAGLIFLOZIN 25 MG PO TABS: 25.0000 mg | ORAL_TABLET | Freq: Every day | ORAL | 1 refills | Status: DC

## 2020-04-02 NOTE — Assessment & Plan Note (Signed)
-  Rx. lipitor

## 2020-04-02 NOTE — Assessment & Plan Note (Signed)
-  BP elevated today -has been taking amlodipine 5 mg and olmesartan 40 mg daily -INCREASE amlodipine to 10 mg

## 2020-04-02 NOTE — Assessment & Plan Note (Addendum)
Lab Results  Component Value Date   TSH 7.650 (H) 03/27/2020   -has post-surgical hypothyroidism -had thyroid cancer and his thyroid was surgically removed -he is followed by Endocrinology, Dr. Buddy Duty

## 2020-04-02 NOTE — Assessment & Plan Note (Addendum)
-  he was started on sertraline at his last visit, but he is not taking it -he is unsure if he is taking duloxetine

## 2020-04-02 NOTE — Assessment & Plan Note (Addendum)
Lab Results  Component Value Date   HGBA1C 7.4 (H) 03/27/2020   -Refilled jardiance -he states he has been eating lots of candy, and he wants to control this with diet
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
## 2020-04-24 ENCOUNTER — Other Ambulatory Visit: Payer: Self-pay

## 2020-04-24 DIAGNOSIS — R252 Cramp and spasm: Secondary | ICD-10-CM

## 2020-04-24 DIAGNOSIS — I10 Essential (primary) hypertension: Secondary | ICD-10-CM

## 2020-04-24 DIAGNOSIS — E782 Mixed hyperlipidemia: Secondary | ICD-10-CM

## 2020-04-24 MED ORDER — CYCLOBENZAPRINE HCL 10 MG PO TABS: 10.0000 mg | ORAL_TABLET | Freq: Two times a day (BID) | ORAL | 0 refills | Status: DC | PRN

## 2020-04-24 MED ORDER — OLMESARTAN MEDOXOMIL 40 MG PO TABS: 40.0000 mg | ORAL_TABLET | Freq: Every day | ORAL | 1 refills | Status: DC

## 2020-05-08 DIAGNOSIS — C6932 Malignant neoplasm of left choroid: Secondary | ICD-10-CM | POA: Diagnosis not present

## 2020-05-29 ENCOUNTER — Other Ambulatory Visit: Payer: Self-pay | Admitting: Nurse Practitioner

## 2020-05-29 DIAGNOSIS — R252 Cramp and spasm: Secondary | ICD-10-CM

## 2020-07-02 ENCOUNTER — Ambulatory Visit: Payer: BC Managed Care – PPO | Admitting: Nurse Practitioner

## 2020-07-11 ENCOUNTER — Other Ambulatory Visit: Payer: Self-pay | Admitting: Nurse Practitioner

## 2020-07-11 ENCOUNTER — Other Ambulatory Visit: Payer: Self-pay

## 2020-07-11 ENCOUNTER — Ambulatory Visit: Payer: BC Managed Care – PPO | Admitting: Nurse Practitioner

## 2020-07-11 ENCOUNTER — Encounter: Payer: Self-pay | Admitting: Nurse Practitioner

## 2020-07-11 VITALS — BP 131/86 | HR 96 | Temp 99.1°F | Resp 20 | Ht 73.0 in | Wt 248.0 lb

## 2020-07-11 DIAGNOSIS — E119 Type 2 diabetes mellitus without complications: Secondary | ICD-10-CM

## 2020-07-11 DIAGNOSIS — I10 Essential (primary) hypertension: Secondary | ICD-10-CM | POA: Diagnosis not present

## 2020-07-11 DIAGNOSIS — L989 Disorder of the skin and subcutaneous tissue, unspecified: Secondary | ICD-10-CM

## 2020-07-11 DIAGNOSIS — N529 Male erectile dysfunction, unspecified: Secondary | ICD-10-CM | POA: Diagnosis not present

## 2020-07-11 DIAGNOSIS — E038 Other specified hypothyroidism: Secondary | ICD-10-CM | POA: Diagnosis not present

## 2020-07-11 DIAGNOSIS — E782 Mixed hyperlipidemia: Secondary | ICD-10-CM

## 2020-07-11 HISTORY — DX: Disorder of the skin and subcutaneous tissue, unspecified: L98.9

## 2020-07-11 MED ORDER — LEVOTHYROXINE SODIUM 112 MCG PO TABS: ORAL_TABLET | ORAL | 3 refills | Status: DC

## 2020-07-11 MED ORDER — EMPAGLIFLOZIN 25 MG PO TABS: 25.0000 mg | ORAL_TABLET | Freq: Every day | ORAL | 3 refills | Status: DC

## 2020-07-11 NOTE — Assessment & Plan Note (Addendum)
-  checking labs -refilled levothyroxine; discussed getting labs first, but he states that he doesn't have many left

## 2020-07-11 NOTE — Assessment & Plan Note (Signed)
check lipids.   
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
## 2020-07-11 NOTE — Assessment & Plan Note (Signed)
-  refilled jardiance -checking labs

## 2020-07-11 NOTE — Assessment & Plan Note (Signed)
BP well controlled.

## 2020-07-11 NOTE — Patient Instructions (Signed)
Please have fasting labs drawn in the next 7 days.

## 2020-07-11 NOTE — Addendum Note (Signed)
Addended by: Lonn Georgia on: 07/11/2020 04:35 PM   Modules accepted: Orders

## 2020-07-11 NOTE — Assessment & Plan Note (Signed)
-  near left eye; hyperpigmented -referral to derm

## 2020-07-13 DIAGNOSIS — C439 Malignant melanoma of skin, unspecified: Secondary | ICD-10-CM | POA: Diagnosis not present

## 2020-07-13 DIAGNOSIS — Z85840 Personal history of malignant neoplasm of eye: Secondary | ICD-10-CM | POA: Diagnosis not present

## 2020-07-13 DIAGNOSIS — C6932 Malignant neoplasm of left choroid: Secondary | ICD-10-CM | POA: Diagnosis not present

## 2020-07-17 ENCOUNTER — Telehealth: Payer: Self-pay | Admitting: Dermatology

## 2020-07-17 NOTE — Telephone Encounter (Signed)
Patient is calling for a referral appointment from Demetrius Revel, NP.  Patient does not want to wait until 01/2021 for an appointment so would like referral sent back to Demetrius Revel, NP.

## 2020-07-23 NOTE — Telephone Encounter (Signed)
Notes documented in referral and routed it back to referring office.

## 2020-07-25 ENCOUNTER — Telehealth: Payer: Self-pay

## 2020-07-25 DIAGNOSIS — E119 Type 2 diabetes mellitus without complications: Secondary | ICD-10-CM | POA: Diagnosis not present

## 2020-07-25 DIAGNOSIS — E782 Mixed hyperlipidemia: Secondary | ICD-10-CM | POA: Diagnosis not present

## 2020-07-25 DIAGNOSIS — I10 Essential (primary) hypertension: Secondary | ICD-10-CM | POA: Diagnosis not present

## 2020-07-25 DIAGNOSIS — E038 Other specified hypothyroidism: Secondary | ICD-10-CM | POA: Diagnosis not present

## 2020-07-25 DIAGNOSIS — N529 Male erectile dysfunction, unspecified: Secondary | ICD-10-CM | POA: Diagnosis not present

## 2020-07-25 NOTE — Telephone Encounter (Signed)
Patient said he would like to be referred to a Dermatology sooner than December. Patient does not want to wait this long.

## 2020-07-26 LAB — PSA TOTAL (REFLEX TO FREE): Prostate Specific Ag, Serum: 1.5 ng/mL (ref 0.0–4.0)

## 2020-07-26 NOTE — Progress Notes (Signed)
HIs A1c is 8.1 which is higher than goal. He has been taking jardiance. I didn't see anything on his allergy list, so is there any reason he is not on metformin or glipizide?

## 2020-07-27 ENCOUNTER — Telehealth: Payer: Self-pay

## 2020-07-27 ENCOUNTER — Other Ambulatory Visit: Payer: Self-pay | Admitting: *Deleted

## 2020-07-27 DIAGNOSIS — I10 Essential (primary) hypertension: Secondary | ICD-10-CM

## 2020-07-27 DIAGNOSIS — E782 Mixed hyperlipidemia: Secondary | ICD-10-CM

## 2020-07-27 LAB — CBC WITH DIFFERENTIAL/PLATELET
Basophils Absolute: 0.1 10*3/uL (ref 0.0–0.2)
Basos: 1 %
EOS (ABSOLUTE): 0.1 10*3/uL (ref 0.0–0.4)
Eos: 2 %
Hematocrit: 50.2 % (ref 37.5–51.0)
Hemoglobin: 16.1 g/dL (ref 13.0–17.7)
Immature Grans (Abs): 0 10*3/uL (ref 0.0–0.1)
Immature Granulocytes: 0 %
Lymphocytes Absolute: 1.6 10*3/uL (ref 0.7–3.1)
Lymphs: 29 %
MCH: 27.7 pg (ref 26.6–33.0)
MCHC: 32.1 g/dL (ref 31.5–35.7)
MCV: 86 fL (ref 79–97)
Monocytes Absolute: 0.5 10*3/uL (ref 0.1–0.9)
Monocytes: 9 %
Neutrophils Absolute: 3.4 10*3/uL (ref 1.4–7.0)
Neutrophils: 59 %
Platelets: 219 10*3/uL (ref 150–450)
RBC: 5.82 x10E6/uL — ABNORMAL HIGH (ref 4.14–5.80)
RDW: 11.8 % (ref 11.6–15.4)
WBC: 5.7 10*3/uL (ref 3.4–10.8)

## 2020-07-27 LAB — CMP14+EGFR
ALT: 36 IU/L (ref 0–44)
AST: 20 IU/L (ref 0–40)
Albumin/Globulin Ratio: 2 (ref 1.2–2.2)
Albumin: 4.4 g/dL (ref 3.8–4.8)
Alkaline Phosphatase: 109 IU/L (ref 44–121)
BUN/Creatinine Ratio: 18 (ref 10–24)
BUN: 15 mg/dL (ref 8–27)
Bilirubin Total: 0.5 mg/dL (ref 0.0–1.2)
CO2: 23 mmol/L (ref 20–29)
Calcium: 9.6 mg/dL (ref 8.6–10.2)
Chloride: 104 mmol/L (ref 96–106)
Creatinine, Ser: 0.82 mg/dL (ref 0.76–1.27)
Globulin, Total: 2.2 g/dL (ref 1.5–4.5)
Glucose: 118 mg/dL — ABNORMAL HIGH (ref 65–99)
Potassium: 4.1 mmol/L (ref 3.5–5.2)
Sodium: 142 mmol/L (ref 134–144)
Total Protein: 6.6 g/dL (ref 6.0–8.5)
eGFR: 98 mL/min/{1.73_m2} (ref >59)

## 2020-07-27 LAB — MICROALBUMIN / CREATININE URINE RATIO
Creatinine, Urine: 107.1 mg/dL
Microalb/Creat Ratio: 15 mg/g creat (ref 0–29)
Microalbumin, Urine: 16.2 ug/mL

## 2020-07-27 LAB — LIPID PANEL WITH LDL/HDL RATIO
Cholesterol, Total: 144 mg/dL (ref 100–199)
HDL: 56 mg/dL (ref >39)
LDL Chol Calc (NIH): 74 mg/dL (ref 0–99)
LDL/HDL Ratio: 1.3 ratio (ref 0.0–3.6)
Triglycerides: 69 mg/dL (ref 0–149)
VLDL Cholesterol Cal: 14 mg/dL (ref 5–40)

## 2020-07-27 LAB — TSH+FREE T4
Free T4: 1.5 ng/dL (ref 0.82–1.77)
TSH: 2.66 u[IU]/mL (ref 0.450–4.500)

## 2020-07-27 LAB — HEMOGLOBIN A1C
Est. average glucose Bld gHb Est-mCnc: 186 mg/dL
Hgb A1c MFr Bld: 8.1 % — ABNORMAL HIGH (ref 4.8–5.6)

## 2020-07-27 MED ORDER — OLMESARTAN MEDOXOMIL 40 MG PO TABS: 40.0000 mg | ORAL_TABLET | Freq: Every day | ORAL | 1 refills | Status: DC

## 2020-07-27 NOTE — Telephone Encounter (Signed)
  olmesartan (BENICAR) 40 MG tablet  Pt is calling in he needs this urgently

## 2020-07-27 NOTE — Telephone Encounter (Signed)
Pt medication sent to pharmacy  

## 2020-07-31 ENCOUNTER — Other Ambulatory Visit: Payer: Self-pay | Admitting: Nurse Practitioner

## 2020-07-31 DIAGNOSIS — E119 Type 2 diabetes mellitus without complications: Secondary | ICD-10-CM

## 2020-07-31 MED ORDER — METFORMIN HCL 500 MG PO TABS
500.0000 mg | ORAL_TABLET | Freq: Two times a day (BID) | ORAL | 3 refills | Status: DC
Start: 2020-07-31 — End: 2021-03-21

## 2020-07-31 NOTE — Progress Notes (Signed)
I sent in metformin for him. The bottle says take it twice per day, but he should take one tablet once per day for the first week to prevent GI side effects. Diarrhea or loose stools are common during the first week, but that should resolve by the end of the first week.

## 2020-08-08 DIAGNOSIS — D485 Neoplasm of uncertain behavior of skin: Secondary | ICD-10-CM | POA: Diagnosis not present

## 2020-08-08 DIAGNOSIS — D225 Melanocytic nevi of trunk: Secondary | ICD-10-CM | POA: Diagnosis not present

## 2020-08-08 DIAGNOSIS — L570 Actinic keratosis: Secondary | ICD-10-CM | POA: Diagnosis not present

## 2020-08-08 DIAGNOSIS — D0359 Melanoma in situ of other part of trunk: Secondary | ICD-10-CM | POA: Diagnosis not present

## 2020-08-08 DIAGNOSIS — Z85820 Personal history of malignant melanoma of skin: Secondary | ICD-10-CM | POA: Diagnosis not present

## 2020-08-08 DIAGNOSIS — C44519 Basal cell carcinoma of skin of other part of trunk: Secondary | ICD-10-CM | POA: Diagnosis not present

## 2020-08-16 DIAGNOSIS — L988 Other specified disorders of the skin and subcutaneous tissue: Secondary | ICD-10-CM | POA: Diagnosis not present

## 2020-08-16 DIAGNOSIS — C4359 Malignant melanoma of other part of trunk: Secondary | ICD-10-CM | POA: Diagnosis not present

## 2020-08-27 NOTE — Telephone Encounter (Signed)
error 

## 2020-09-03 DIAGNOSIS — D485 Neoplasm of uncertain behavior of skin: Secondary | ICD-10-CM | POA: Diagnosis not present

## 2020-09-03 DIAGNOSIS — L905 Scar conditions and fibrosis of skin: Secondary | ICD-10-CM | POA: Diagnosis not present

## 2020-09-03 DIAGNOSIS — D225 Melanocytic nevi of trunk: Secondary | ICD-10-CM | POA: Diagnosis not present

## 2020-09-03 DIAGNOSIS — C44519 Basal cell carcinoma of skin of other part of trunk: Secondary | ICD-10-CM | POA: Diagnosis not present

## 2020-09-03 DIAGNOSIS — D2272 Melanocytic nevi of left lower limb, including hip: Secondary | ICD-10-CM | POA: Diagnosis not present

## 2020-09-04 ENCOUNTER — Telehealth: Payer: Self-pay

## 2020-09-04 ENCOUNTER — Other Ambulatory Visit: Payer: Self-pay

## 2020-09-04 MED ORDER — AMLODIPINE BESYLATE 10 MG PO TABS: 10.0000 mg | ORAL_TABLET | Freq: Every day | ORAL | 3 refills | Status: DC

## 2020-09-04 NOTE — Telephone Encounter (Signed)
Patient called need med refills  amLODipine (NORVASC) 10 MG tablet   Pharmacy: [facility]

## 2020-09-04 NOTE — Telephone Encounter (Signed)
Rx sent 

## 2020-09-05 ENCOUNTER — Other Ambulatory Visit: Payer: Self-pay

## 2020-09-05 ENCOUNTER — Telehealth: Payer: Self-pay

## 2020-09-05 DIAGNOSIS — E782 Mixed hyperlipidemia: Secondary | ICD-10-CM

## 2020-09-05 DIAGNOSIS — N529 Male erectile dysfunction, unspecified: Secondary | ICD-10-CM

## 2020-09-05 DIAGNOSIS — I10 Essential (primary) hypertension: Secondary | ICD-10-CM

## 2020-09-05 MED ORDER — SILDENAFIL CITRATE 50 MG PO TABS: 50.0000 mg | ORAL_TABLET | Freq: Every day | ORAL | 0 refills | Status: DC | PRN

## 2020-09-05 NOTE — Telephone Encounter (Signed)
Patient called and need med refill sildenafil (VIAGRA) 50 MG tablet  Pharmacy: [facility]

## 2020-09-05 NOTE — Telephone Encounter (Signed)
Rx resent.
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
## 2020-09-05 NOTE — Telephone Encounter (Signed)
Rx filled

## 2020-09-12 ENCOUNTER — Other Ambulatory Visit: Payer: Self-pay

## 2020-09-12 ENCOUNTER — Telehealth: Payer: Self-pay

## 2020-09-12 DIAGNOSIS — N529 Male erectile dysfunction, unspecified: Secondary | ICD-10-CM

## 2020-09-12 MED ORDER — SILDENAFIL CITRATE 50 MG PO TABS
50.0000 mg | ORAL_TABLET | Freq: Every day | ORAL | 0 refills | Status: DC | PRN
Start: 2020-09-12 — End: 2022-06-06

## 2020-09-12 NOTE — Telephone Encounter (Signed)
Patient called said needs this medicine sildenafil (VIAGRA) 50 MG tablet  Sent to [facility] cost only $ 2.50 a pill and [facility] $ [facility] 10 [facility].  Please sent into [facility]

## 2020-09-24 ENCOUNTER — Other Ambulatory Visit: Payer: Self-pay

## 2020-09-24 ENCOUNTER — Telehealth: Payer: Self-pay | Admitting: Nurse Practitioner

## 2020-09-24 MED ORDER — ATORVASTATIN CALCIUM 40 MG PO TABS: 40.0000 mg | ORAL_TABLET | Freq: Every day | ORAL | 3 refills | Status: DC

## 2020-09-24 NOTE — Telephone Encounter (Signed)
Pt needs a refill of atorvastatin ,,[facility], on [address]

## 2020-09-24 NOTE — Telephone Encounter (Signed)
Sent!

## 2020-10-04 DIAGNOSIS — L988 Other specified disorders of the skin and subcutaneous tissue: Secondary | ICD-10-CM | POA: Diagnosis not present

## 2020-10-04 DIAGNOSIS — D485 Neoplasm of uncertain behavior of skin: Secondary | ICD-10-CM | POA: Diagnosis not present

## 2020-10-04 DIAGNOSIS — D225 Melanocytic nevi of trunk: Secondary | ICD-10-CM | POA: Diagnosis not present

## 2020-10-05 ENCOUNTER — Other Ambulatory Visit: Payer: Self-pay

## 2020-10-05 ENCOUNTER — Telehealth (INDEPENDENT_AMBULATORY_CARE_PROVIDER_SITE_OTHER): Payer: BC Managed Care – PPO | Admitting: Nurse Practitioner

## 2020-10-05 ENCOUNTER — Encounter: Payer: Self-pay | Admitting: Nurse Practitioner

## 2020-10-05 VITALS — Ht 72.0 in | Wt 250.0 lb

## 2020-10-05 DIAGNOSIS — L02213 Cutaneous abscess of chest wall: Secondary | ICD-10-CM
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
## 2020-10-09 ENCOUNTER — Ambulatory Visit: Payer: BC Managed Care – PPO | Admitting: Nurse Practitioner

## 2020-10-09 DIAGNOSIS — G4733 Obstructive sleep apnea (adult) (pediatric): Secondary | ICD-10-CM | POA: Diagnosis not present

## 2020-10-09 DIAGNOSIS — Z7982 Long term (current) use of aspirin: Secondary | ICD-10-CM | POA: Diagnosis not present

## 2020-10-09 DIAGNOSIS — I10 Essential (primary) hypertension: Secondary | ICD-10-CM | POA: Diagnosis not present

## 2020-10-09 DIAGNOSIS — I082 Rheumatic disorders of both aortic and tricuspid valves: Secondary | ICD-10-CM | POA: Diagnosis not present

## 2020-10-09 DIAGNOSIS — F419 Anxiety disorder, unspecified: Secondary | ICD-10-CM | POA: Diagnosis not present

## 2020-10-09 DIAGNOSIS — Z85820 Personal history of malignant melanoma of skin: Secondary | ICD-10-CM | POA: Diagnosis not present

## 2020-10-09 DIAGNOSIS — Z79899 Other long term (current) drug therapy: Secondary | ICD-10-CM | POA: Diagnosis not present

## 2020-10-09 DIAGNOSIS — R9431 Abnormal electrocardiogram [ECG] [EKG]: Secondary | ICD-10-CM | POA: Diagnosis not present

## 2020-10-09 DIAGNOSIS — Z7984 Long term (current) use of oral hypoglycemic drugs: Secondary | ICD-10-CM | POA: Diagnosis not present

## 2020-10-09 DIAGNOSIS — E039 Hypothyroidism, unspecified: Secondary | ICD-10-CM | POA: Diagnosis not present

## 2020-10-09 DIAGNOSIS — R079 Chest pain, unspecified: Secondary | ICD-10-CM | POA: Diagnosis not present

## 2020-10-09 DIAGNOSIS — Z85850 Personal history of malignant neoplasm of thyroid: Secondary | ICD-10-CM | POA: Diagnosis not present

## 2020-10-09 DIAGNOSIS — Z9119 Patient's noncompliance with other medical treatment and regimen: Secondary | ICD-10-CM | POA: Diagnosis not present

## 2020-10-09 DIAGNOSIS — Z79890 Hormone replacement therapy (postmenopausal): Secondary | ICD-10-CM | POA: Diagnosis not present

## 2020-10-09 DIAGNOSIS — R519 Headache, unspecified: Secondary | ICD-10-CM | POA: Diagnosis not present

## 2020-10-09 DIAGNOSIS — Z20822 Contact with and (suspected) exposure to covid-19: Secondary | ICD-10-CM | POA: Diagnosis not present

## 2020-10-09 DIAGNOSIS — E119 Type 2 diabetes mellitus without complications: Secondary | ICD-10-CM | POA: Diagnosis not present

## 2020-10-09 DIAGNOSIS — I4891 Unspecified atrial fibrillation: Secondary | ICD-10-CM | POA: Diagnosis not present

## 2020-10-09 DIAGNOSIS — E890 Postprocedural hypothyroidism: Secondary | ICD-10-CM | POA: Diagnosis not present

## 2020-10-10 DIAGNOSIS — I10 Essential (primary) hypertension: Secondary | ICD-10-CM | POA: Diagnosis not present

## 2020-10-10 DIAGNOSIS — F419 Anxiety disorder, unspecified: Secondary | ICD-10-CM | POA: Diagnosis not present

## 2020-10-10 DIAGNOSIS — I517 Cardiomegaly: Secondary | ICD-10-CM | POA: Diagnosis not present

## 2020-10-10 DIAGNOSIS — E119 Type 2 diabetes mellitus without complications: Secondary | ICD-10-CM | POA: Diagnosis not present

## 2020-10-10 DIAGNOSIS — I4891 Unspecified atrial fibrillation: Secondary | ICD-10-CM | POA: Diagnosis not present

## 2020-10-10 DIAGNOSIS — I351 Nonrheumatic aortic (valve) insufficiency: Secondary | ICD-10-CM | POA: Diagnosis not present

## 2020-10-18 DIAGNOSIS — I25118 Atherosclerotic heart disease of native coronary artery with other forms of angina pectoris: Secondary | ICD-10-CM | POA: Diagnosis not present

## 2020-10-18 DIAGNOSIS — I480 Paroxysmal atrial fibrillation: Secondary | ICD-10-CM | POA: Diagnosis not present

## 2020-10-18 DIAGNOSIS — I10 Essential (primary) hypertension: Secondary | ICD-10-CM | POA: Diagnosis not present

## 2020-10-18 DIAGNOSIS — E782 Mixed hyperlipidemia: Secondary | ICD-10-CM | POA: Diagnosis not present

## 2020-11-02 DIAGNOSIS — I480 Paroxysmal atrial fibrillation: Secondary | ICD-10-CM | POA: Diagnosis not present

## 2020-11-02 DIAGNOSIS — I200 Unstable angina: Secondary | ICD-10-CM | POA: Diagnosis not present

## 2020-11-02 DIAGNOSIS — I517 Cardiomegaly: Secondary | ICD-10-CM | POA: Diagnosis not present

## 2020-11-02 DIAGNOSIS — I25118 Atherosclerotic heart disease of native coronary artery with other forms of angina pectoris: Secondary | ICD-10-CM | POA: Diagnosis not present

## 2020-11-05 ENCOUNTER — Telehealth: Payer: Self-pay

## 2020-11-05 ENCOUNTER — Other Ambulatory Visit: Payer: Self-pay | Admitting: Nurse Practitioner

## 2020-11-05 DIAGNOSIS — F419 Anxiety disorder, unspecified: Secondary | ICD-10-CM

## 2020-11-05 MED ORDER — CITALOPRAM HYDROBROMIDE 20 MG PO TABS: 20.0000 mg | ORAL_TABLET | Freq: Every day | ORAL | 3 refills | Status: DC

## 2020-11-05 NOTE — Telephone Encounter (Signed)
I sent in citalopram.

## 2020-11-05 NOTE — Telephone Encounter (Signed)
Patient called needs some aniexty meds went to ER, scheduled an appt for 10/4 (also added to cancellation list) but he said he will need medication before end of this week. Call back # [phone]

## 2020-11-05 NOTE — Telephone Encounter (Signed)
Pt says the ER gave him anxiety meds-Citalopram '20mg'$  when he went and he is almost out. He wants to know if you can send him in a refill until he sees you for his apointment on 10/4. He said it is helping tremendously. He went to [facility] in [place]. Uses [facility] in [place].

## 2020-11-13 ENCOUNTER — Ambulatory Visit: Payer: BC Managed Care – PPO | Admitting: Nurse Practitioner

## 2020-11-20 ENCOUNTER — Ambulatory Visit (INDEPENDENT_AMBULATORY_CARE_PROVIDER_SITE_OTHER): Payer: BC Managed Care – PPO | Admitting: Nurse Practitioner

## 2020-11-20 ENCOUNTER — Encounter: Payer: Self-pay | Admitting: Nurse Practitioner

## 2020-11-20 ENCOUNTER — Other Ambulatory Visit: Payer: Self-pay

## 2020-11-20 VITALS — BP 123/74 | HR 79 | Temp 98.5°F | Ht 73.0 in | Wt 235.0 lb

## 2020-11-20 DIAGNOSIS — E038 Other specified hypothyroidism: Secondary | ICD-10-CM

## 2020-11-20 DIAGNOSIS — I10 Essential (primary) hypertension: Secondary | ICD-10-CM | POA: Diagnosis not present

## 2020-11-20 DIAGNOSIS — E782 Mixed hyperlipidemia: Secondary | ICD-10-CM

## 2020-11-20 DIAGNOSIS — E119 Type 2 diabetes mellitus without complications: Secondary | ICD-10-CM

## 2020-11-20 DIAGNOSIS — I4891 Unspecified atrial fibrillation: Secondary | ICD-10-CM | POA: Insufficient documentation

## 2020-11-20 DIAGNOSIS — Z23 Encounter for immunization: Secondary | ICD-10-CM

## 2020-11-20 DIAGNOSIS — I4819 Other persistent atrial fibrillation: Secondary | ICD-10-CM

## 2020-11-20 NOTE — Assessment & Plan Note (Signed)
BP Readings from Last 3 Encounters:  11/20/20 123/74  07/11/20 131/86  04/02/20 (!) 158/88   -well controlled -no change to antihypertensives

## 2020-11-20 NOTE — Assessment & Plan Note (Signed)
-  check A1c with labs -takes metformin and jardiance

## 2020-11-20 NOTE — Assessment & Plan Note (Signed)
-  possibly on eliquis for anticoagulation -he didn't bring in his medication, and he isn't sure what he is taking

## 2020-11-20 NOTE — Patient Instructions (Signed)
Please have fasting labs drawn this week. 

## 2020-11-20 NOTE — Assessment & Plan Note (Signed)
-  checking lipids -he hasn't been taking atorvastatin
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
## 2020-11-20 NOTE — Assessment & Plan Note (Addendum)
-  check thyroid function with labs -he changed his levothyroxine dosage from 224 mcg daily to 168 mcg daily

## 2020-11-29 ENCOUNTER — Encounter: Payer: Self-pay | Admitting: Nurse Practitioner

## 2020-11-29 DIAGNOSIS — E038 Other specified hypothyroidism: Secondary | ICD-10-CM | POA: Diagnosis not present

## 2020-11-29 DIAGNOSIS — E119 Type 2 diabetes mellitus without complications: Secondary | ICD-10-CM | POA: Diagnosis not present

## 2020-11-29 DIAGNOSIS — I10 Essential (primary) hypertension: Secondary | ICD-10-CM | POA: Diagnosis not present

## 2020-11-29 DIAGNOSIS — E782 Mixed hyperlipidemia: Secondary | ICD-10-CM | POA: Diagnosis not present

## 2020-11-30 LAB — CBC WITH DIFFERENTIAL/PLATELET
Basophils Absolute: 0 10*3/uL (ref 0.0–0.2)
Basos: 1 %
EOS (ABSOLUTE): 0.1 10*3/uL (ref 0.0–0.4)
Eos: 1 %
Hematocrit: 48.1 % (ref 37.5–51.0)
Hemoglobin: 15.5 g/dL (ref 13.0–17.7)
Immature Grans (Abs): 0 10*3/uL (ref 0.0–0.1)
Immature Granulocytes: 0 %
Lymphocytes Absolute: 1.4 10*3/uL (ref 0.7–3.1)
Lymphs: 23 %
MCH: 27.8 pg (ref 26.6–33.0)
MCHC: 32.2 g/dL (ref 31.5–35.7)
MCV: 86 fL (ref 79–97)
Monocytes Absolute: 0.4 10*3/uL (ref 0.1–0.9)
Monocytes: 7 %
Neutrophils Absolute: 4.1 10*3/uL (ref 1.4–7.0)
Neutrophils: 68 %
Platelets: 231 10*3/uL (ref 150–450)
RBC: 5.57 x10E6/uL (ref 4.14–5.80)
RDW: 11.6 % (ref 11.6–15.4)
WBC: 6 10*3/uL (ref 3.4–10.8)

## 2020-11-30 LAB — HEMOGLOBIN A1C
Est. average glucose Bld gHb Est-mCnc: 140 mg/dL
Hgb A1c MFr Bld: 6.5 % — ABNORMAL HIGH (ref 4.8–5.6)

## 2020-11-30 LAB — CMP14+EGFR
ALT: 34 IU/L (ref 0–44)
AST: 17 IU/L (ref 0–40)
Albumin/Globulin Ratio: 2 (ref 1.2–2.2)
Albumin: 4.2 g/dL (ref 3.8–4.8)
Alkaline Phosphatase: 95 IU/L (ref 44–121)
BUN/Creatinine Ratio: 24 (ref 10–24)
BUN: 20 mg/dL (ref 8–27)
Bilirubin Total: 0.4 mg/dL (ref 0.0–1.2)
CO2: 24 mmol/L (ref 20–29)
Calcium: 9.2 mg/dL (ref 8.6–10.2)
Chloride: 102 mmol/L (ref 96–106)
Creatinine, Ser: 0.84 mg/dL (ref 0.76–1.27)
Globulin, Total: 2.1 g/dL (ref 1.5–4.5)
Glucose: 98 mg/dL (ref 70–99)
Potassium: 4.3 mmol/L (ref 3.5–5.2)
Sodium: 139 mmol/L (ref 134–144)
Total Protein: 6.3 g/dL (ref 6.0–8.5)
eGFR: 97 mL/min/{1.73_m2} (ref >59)

## 2020-11-30 LAB — TSH+FREE T4
Free T4: 1.24 ng/dL (ref 0.82–1.77)
TSH: 1.63 u[IU]/mL (ref 0.450–4.500)

## 2020-11-30 LAB — LIPID PANEL WITH LDL/HDL RATIO
Cholesterol, Total: 125 mg/dL (ref 100–199)
HDL: 57 mg/dL (ref >39)
LDL Chol Calc (NIH): 54 mg/dL (ref 0–99)
LDL/HDL Ratio: 0.9 ratio (ref 0.0–3.6)
Triglycerides: 64 mg/dL (ref 0–149)
VLDL Cholesterol Cal: 14 mg/dL (ref 5–40)

## 2020-12-04 DIAGNOSIS — C6932 Malignant neoplasm of left choroid: Secondary | ICD-10-CM | POA: Diagnosis not present

## 2021-01-17 DIAGNOSIS — E782 Mixed hyperlipidemia: Secondary | ICD-10-CM | POA: Diagnosis not present

## 2021-01-17 DIAGNOSIS — I10 Essential (primary) hypertension: Secondary | ICD-10-CM | POA: Diagnosis not present

## 2021-01-17 DIAGNOSIS — I25118 Atherosclerotic heart disease of native coronary artery with other forms of angina pectoris: Secondary | ICD-10-CM | POA: Diagnosis not present

## 2021-01-17 DIAGNOSIS — I480 Paroxysmal atrial fibrillation: Secondary | ICD-10-CM | POA: Diagnosis not present

## 2021-01-17 DIAGNOSIS — G4733 Obstructive sleep apnea (adult) (pediatric): Secondary | ICD-10-CM | POA: Insufficient documentation

## 2021-02-25 DIAGNOSIS — M7051 Other bursitis of knee, right knee: Secondary | ICD-10-CM | POA: Diagnosis not present

## 2021-02-27 ENCOUNTER — Telehealth: Payer: Self-pay | Admitting: Nurse Practitioner

## 2021-02-27 ENCOUNTER — Other Ambulatory Visit: Payer: Self-pay

## 2021-02-27 ENCOUNTER — Ambulatory Visit: Payer: BC Managed Care – PPO | Admitting: Nurse Practitioner

## 2021-02-27 ENCOUNTER — Encounter: Payer: Self-pay | Admitting: Nurse Practitioner

## 2021-02-27 VITALS — BP 134/78 | HR 63 | Ht 73.0 in | Wt 249.0 lb

## 2021-02-27 DIAGNOSIS — I10 Essential (primary) hypertension: Secondary | ICD-10-CM

## 2021-02-27 DIAGNOSIS — E669 Obesity, unspecified: Secondary | ICD-10-CM

## 2021-02-27 DIAGNOSIS — R252 Cramp and spasm: Secondary | ICD-10-CM | POA: Diagnosis not present

## 2021-02-27 DIAGNOSIS — Z6833 Body mass index (BMI) 33.0-33.9, adult: Secondary | ICD-10-CM | POA: Insufficient documentation

## 2021-02-27 DIAGNOSIS — E119 Type 2 diabetes mellitus without complications: Secondary | ICD-10-CM

## 2021-02-27 DIAGNOSIS — S8991XA Unspecified injury of right lower leg, initial encounter: Secondary | ICD-10-CM | POA: Diagnosis not present

## 2021-02-27 DIAGNOSIS — E782 Mixed hyperlipidemia: Secondary | ICD-10-CM | POA: Diagnosis not present

## 2021-02-27 MED ORDER — PANTOPRAZOLE SODIUM 40 MG PO TBEC: 40.0000 mg | DELAYED_RELEASE_TABLET | Freq: Every day | ORAL | 1 refills | Status: DC

## 2021-02-27 MED ORDER — CYCLOBENZAPRINE HCL 10 MG PO TABS: ORAL_TABLET | ORAL | 0 refills | Status: DC

## 2021-02-27 MED ORDER — IBUPROFEN 600 MG PO TABS: 600.0000 mg | ORAL_TABLET | Freq: Two times a day (BID) | ORAL | 0 refills | Status: DC | PRN

## 2021-02-27 NOTE — Assessment & Plan Note (Signed)
Has diabetes and HTN  Importance of healthy food choices with portion control discussed as well as eating regularly within 12  hour window.   The need to choose clean green food 50%-75% of time is discussed as well as make water the primary drink and set a goal for 64 ounces daily.  Patient reeducated about the importance of committment to minimum of 150 minutes of exercise per week.  Three meals at set times with snacks allowed between meals but they must be fruit or vegetable.   Aim to eat  over 12 hour period  for example 7 am to 7 pm. Stop after your last meal of the day.  Wt Readings from Last 3 Encounters:  02/27/21 249 lb (112.9 kg)  11/20/20 235 lb 0.6 oz (106.6 kg)  10/05/20 250 lb (113.4 kg)

## 2021-02-27 NOTE — Patient Instructions (Addendum)
Please use ibuprofen 600 mg two times daily as needed for your knee pain.  Please take pantoprazole 40mg  daily while using your ibuprofen.  Please get your shingles vaccine and covid vaccine at your pharmacy.    It is important that you exercise regularly at least 30 minutes 5 times a week.  Think about what you will eat, plan ahead. Choose " clean, green, fresh or frozen" over canned, processed or packaged foods which are more sugary, salty and fatty. 70 to 75% of food eaten should be vegetables and fruit. Three meals at set times with snacks allowed between meals, but they must be fruit or vegetables. Aim to eat over a 12 hour period , example 7 am to 7 pm, and STOP after  your last meal of the day. Drink water,generally about 64 ounces per day, no other drink is as healthy. Fruit juice is best enjoyed in a healthy way, by EATING the fruit.  Thanks for choosing [facility], we consider it a privelige to serve you.

## 2021-02-27 NOTE — Assessment & Plan Note (Signed)
DASH diet and commitment to daily physical activity for a minimum of 30 minutes discussed and encouraged, as a part of hypertension management. The importance of attaining a healthy weight is also discussed.  BP/Weight 02/27/2021 11/20/2020 10/05/2020 07/11/2020 04/02/2020 02/28/2020 47/08/6149  Systolic BP 834 373 - 578 978 478 412  Diastolic BP 78 74 - 86 88 107 94  Wt. (Lbs) 249 235.04 250 248 248 248 250  BMI 32.85 31.01 33.91 32.72 32.72 33.63 32.1  continue current medications.

## 2021-02-27 NOTE — Telephone Encounter (Signed)
Called and spoke with patient, he has signed up and received access to [facility]. Aware he can view/print work note from there. Patient aware to call if he has any questions or concerns.

## 2021-02-27 NOTE — Assessment & Plan Note (Signed)
Lab Results  Component Value Date   HGBA1C 6.5 (H) 11/29/2020  continue current meds,

## 2021-02-27 NOTE — Telephone Encounter (Signed)
Please send the pt a work note for today --I sent the patient a link to sign up for my chart

## 2021-02-27 NOTE — Assessment & Plan Note (Signed)
Continue current meds.well controlled.

## 2021-02-27 NOTE — Assessment & Plan Note (Addendum)
Take ibuprofen 600mg  BID PRN, alternate with tylenol 650mg  every 6 hours as needed.  Flexeril 10mg  BID PRN. Take protonix 40mg  daily while taking ibuprofen to reduce GI upset., pt told to notify provider if he has any rectal bleeding.  Elevated right leg when sitting to decrease swelling Refer to otho. Marland Kitchen

## 2021-02-27 NOTE — Progress Notes (Signed)
° °  Samuel Hubbard     MRN: [id number]      DOB: 10/26/1955   HPI Samuel Hubbard is here for complaints of swollen knee, a week ago, he hit his  right knee on the toilet knee swelled up immediately, went to urgent care, he has an xray done,  he was told that he has bursitis,  Has pain 7/10, feels like a pressure. Knee area  feels very painful and swollen, pain and swelling is worse early in the morning, pain gets better later in the day. Denies numbness, tingling, able to walk on the affected leg.  He has been taking OTC ibuprofen , ibuprofen not helping. He also used ice.   ROS Denies recent fever or chills. Denies sinus pressure, nasal congestion, ear pain or sore throat. Denies chest congestion, productive cough or wheezing. Denies chest pains, palpitations and leg swelling Denies abdominal pain, nausea, vomiting,diarrhea or constipation.   Denies dysuria, frequency, hesitancy or incontinence. Has knee joint pain, swelling no  limitation in mobility. Denies headaches, seizures, numbness, or tingling. Denies depression, anxiety or insomnia. Denies skin break down or rash.   PE  BP (!) 149/87 (BP Location: Right Arm, Patient Position: Sitting, Cuff Size: Large)    Pulse 63    Ht 6\' 1"  (1.854 m)    Wt 249 lb (112.9 kg)    SpO2 98%    BMI 32.85 kg/m   Patient alert and oriented and in no cardiopulmonary distress.  HEENT: No facial asymmetry, EOMI,     Neck supple .  Chest: Clear to auscultation bilaterally.  CVS: S1, S2 no murmurs, no S3.Regular rate.  ABD: Soft non tender.   Ext: No edema  MS: Adequate ROM spine, shoulders, hips and knees., able to do ROM of right knee joint, right knee swelling and tenderness   on palpation, right leg appears swollen, skin feels warm no erythema , has palpable pedal pulse   Skin: Intact, no ulcerations or rash noted.  Psych: Good eye contact, normal affect. Memory intact not anxious or depressed appearing.  CNS: CN 2-12 intact, power,  normal  throughout.no focal deficits noted.   Assessment & Plan

## 2021-03-04 ENCOUNTER — Other Ambulatory Visit: Payer: Self-pay | Admitting: Nurse Practitioner

## 2021-03-04 NOTE — Progress Notes (Signed)
Left a voice mail on pt's phone to stop using ibuprofen die to risk of bleeding , takes eliquis 5mg 

## 2021-03-06 DIAGNOSIS — D485 Neoplasm of uncertain behavior of skin: Secondary | ICD-10-CM | POA: Diagnosis not present

## 2021-03-06 DIAGNOSIS — D2271 Melanocytic nevi of right lower limb, including hip: Secondary | ICD-10-CM | POA: Diagnosis not present

## 2021-03-06 DIAGNOSIS — L821 Other seborrheic keratosis: Secondary | ICD-10-CM | POA: Diagnosis not present

## 2021-03-06 DIAGNOSIS — D2272 Melanocytic nevi of left lower limb, including hip: Secondary | ICD-10-CM | POA: Diagnosis not present

## 2021-03-11 ENCOUNTER — Other Ambulatory Visit: Payer: Self-pay | Admitting: Nurse Practitioner

## 2021-03-11 DIAGNOSIS — R252 Cramp and spasm: Secondary | ICD-10-CM

## 2021-03-11 DIAGNOSIS — S8991XA Unspecified injury of right lower leg, initial encounter: Secondary | ICD-10-CM

## 2021-03-20 ENCOUNTER — Encounter: Payer: Self-pay | Admitting: Orthopaedic Surgery

## 2021-03-20 ENCOUNTER — Other Ambulatory Visit: Payer: Self-pay

## 2021-03-20 ENCOUNTER — Telehealth: Payer: Self-pay | Admitting: Orthopaedic Surgery

## 2021-03-20 ENCOUNTER — Ambulatory Visit (INDEPENDENT_AMBULATORY_CARE_PROVIDER_SITE_OTHER): Payer: BC Managed Care – PPO | Admitting: Orthopaedic Surgery

## 2021-03-20 DIAGNOSIS — S8001XA Contusion of right knee, initial encounter: Secondary | ICD-10-CM | POA: Diagnosis not present

## 2021-03-20 DIAGNOSIS — M25561 Pain in right knee: Secondary | ICD-10-CM | POA: Diagnosis not present

## 2021-03-20 DIAGNOSIS — G8929 Other chronic pain: Secondary | ICD-10-CM | POA: Diagnosis not present

## 2021-03-20 DIAGNOSIS — E119 Type 2 diabetes mellitus without complications: Secondary | ICD-10-CM | POA: Diagnosis not present

## 2021-03-20 DIAGNOSIS — E782 Mixed hyperlipidemia: Secondary | ICD-10-CM | POA: Diagnosis not present

## 2021-03-20 MED ORDER — NAPROXEN 500 MG PO TABS: 500.0000 mg | ORAL_TABLET | Freq: Two times a day (BID) | ORAL | 5 refills | Status: DC
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
## 2021-03-20 NOTE — Telephone Encounter (Signed)
Patient would like a paper mailed to him stating what is wrong with him.  AVS

## 2021-03-21 ENCOUNTER — Other Ambulatory Visit: Payer: Self-pay | Admitting: Nurse Practitioner

## 2021-03-21 ENCOUNTER — Telehealth: Payer: Self-pay

## 2021-03-21 DIAGNOSIS — E119 Type 2 diabetes mellitus without complications: Secondary | ICD-10-CM

## 2021-03-21 DIAGNOSIS — Z139 Encounter for screening, unspecified: Secondary | ICD-10-CM

## 2021-03-21 DIAGNOSIS — I10 Essential (primary) hypertension: Secondary | ICD-10-CM

## 2021-03-21 DIAGNOSIS — E782 Mixed hyperlipidemia: Secondary | ICD-10-CM

## 2021-03-21 LAB — CMP14+EGFR
ALT: 17 IU/L (ref 0–44)
AST: 13 IU/L (ref 0–40)
Albumin/Globulin Ratio: 1.9 (ref 1.2–2.2)
Albumin: 4 g/dL (ref 3.8–4.8)
Alkaline Phosphatase: 79 IU/L (ref 44–121)
BUN/Creatinine Ratio: 25 — ABNORMAL HIGH (ref 10–24)
BUN: 22 mg/dL (ref 8–27)
Bilirubin Total: 0.4 mg/dL (ref 0.0–1.2)
CO2: 25 mmol/L (ref 20–29)
Calcium: 8.9 mg/dL (ref 8.6–10.2)
Chloride: 102 mmol/L (ref 96–106)
Creatinine, Ser: 0.89 mg/dL (ref 0.76–1.27)
Globulin, Total: 2.1 g/dL (ref 1.5–4.5)
Glucose: 121 mg/dL — ABNORMAL HIGH (ref 70–99)
Potassium: 4.2 mmol/L (ref 3.5–5.2)
Sodium: 139 mmol/L (ref 134–144)
Total Protein: 6.1 g/dL (ref 6.0–8.5)
eGFR: 96 mL/min/{1.73_m2} (ref >59)

## 2021-03-21 LAB — HEMOGLOBIN A1C
Est. average glucose Bld gHb Est-mCnc: 163 mg/dL
Hgb A1c MFr Bld: 7.3 % — ABNORMAL HIGH (ref 4.8–5.6)

## 2021-03-21 LAB — LIPID PANEL
Chol/HDL Ratio: 2.3 ratio (ref 0.0–5.0)
Cholesterol, Total: 132 mg/dL (ref 100–199)
HDL: 57 mg/dL (ref >39)
LDL Chol Calc (NIH): 61 mg/dL (ref 0–99)
Triglycerides: 71 mg/dL (ref 0–149)
VLDL Cholesterol Cal: 14 mg/dL (ref 5–40)

## 2021-03-21 MED ORDER — EMPAGLIFLOZIN 25 MG PO TABS: 25.0000 mg | ORAL_TABLET | Freq: Every day | ORAL | 3 refills | Status: DC

## 2021-03-21 MED ORDER — METFORMIN HCL 500 MG PO TABS: 1000.0000 mg | ORAL_TABLET | Freq: Two times a day (BID) | ORAL | 3 refills | Status: DC

## 2021-03-21 NOTE — Telephone Encounter (Signed)
Patient returning calls about lab results.

## 2021-03-21 NOTE — Progress Notes (Signed)
Please review labs with patient. A1c went up from 6.5-7.3.  Patient should start taking metformin 1000 mg 2 times daily. Continue Jardiance 25 mg daily, Thank you. Patient should have fasting labs done before his next visit.  I have ordered the labs. THANKS

## 2021-03-22 ENCOUNTER — Other Ambulatory Visit: Payer: Self-pay | Admitting: Nurse Practitioner

## 2021-03-25 ENCOUNTER — Ambulatory Visit: Payer: BC Managed Care – PPO | Admitting: Nurse Practitioner

## 2021-03-25 ENCOUNTER — Telehealth: Payer: Self-pay | Admitting: Nurse Practitioner

## 2021-03-25 NOTE — Telephone Encounter (Signed)
Called pt back advised of Fola's message in regards to metformin. Pt verbalized understanding

## 2021-03-25 NOTE — Telephone Encounter (Signed)
Pt called back to return call

## 2021-03-29 ENCOUNTER — Other Ambulatory Visit: Payer: Self-pay | Admitting: Nurse Practitioner

## 2021-03-29 DIAGNOSIS — R252 Cramp and spasm: Secondary | ICD-10-CM

## 2021-03-29 DIAGNOSIS — S8991XA Unspecified injury of right lower leg, initial encounter: Secondary | ICD-10-CM

## 2021-04-03 ENCOUNTER — Other Ambulatory Visit: Payer: Self-pay

## 2021-04-03 ENCOUNTER — Encounter: Payer: Self-pay | Admitting: Orthopaedic Surgery

## 2021-04-03 ENCOUNTER — Ambulatory Visit (INDEPENDENT_AMBULATORY_CARE_PROVIDER_SITE_OTHER): Payer: BC Managed Care – PPO | Admitting: Orthopaedic Surgery

## 2021-04-03 DIAGNOSIS — S8001XA Contusion of right knee, initial encounter: Secondary | ICD-10-CM | POA: Diagnosis not present

## 2021-04-03 DIAGNOSIS — G8929 Other chronic pain: Secondary | ICD-10-CM

## 2021-04-03 DIAGNOSIS — M25561 Pain in right knee: Secondary | ICD-10-CM

## 2021-04-03 NOTE — Progress Notes (Signed)
I am better.  He has less pain of the right knee and the hematoma is resolving.  He has ecchymosis resolving of the right lower leg.  NV intact.  He is walking well.  He has some pain of the left knee secondary to shifting his weight.  He has no new trauma.  He is pleased with his results so far.  Encounter Diagnoses  Name Primary?   Chronic pain of right knee Yes   Traumatic hematoma of right knee, initial encounter    ROM of the right knee is full as is the left.  NV intact.  Gait is good.  Hematoma is resolving well.  I will see as needed.  Use heat as needed.  Call if any problem.  Precautions discussed.  Electronically Signed Sanjuana Kava, MD 2/15/20239:33 AM

## 2021-04-11 DIAGNOSIS — L988 Other specified disorders of the skin and subcutaneous tissue: Secondary | ICD-10-CM | POA: Diagnosis not present

## 2021-04-11 DIAGNOSIS — D2272 Melanocytic nevi of left lower limb, including hip: Secondary | ICD-10-CM | POA: Diagnosis not present

## 2021-04-11 DIAGNOSIS — D2372 Other benign neoplasm of skin of left lower limb, including hip: Secondary | ICD-10-CM | POA: Diagnosis not present

## 2021-05-01 ENCOUNTER — Other Ambulatory Visit: Payer: Self-pay | Admitting: Nurse Practitioner

## 2021-05-01 DIAGNOSIS — R252 Cramp and spasm: Secondary | ICD-10-CM

## 2021-05-01 DIAGNOSIS — S8991XA Unspecified injury of right lower leg, initial encounter: Secondary | ICD-10-CM

## 2021-05-02 ENCOUNTER — Other Ambulatory Visit: Payer: Self-pay | Admitting: Nurse Practitioner

## 2021-05-02 MED ORDER — CYCLOBENZAPRINE HCL 10 MG PO TABS: 10.0000 mg | ORAL_TABLET | Freq: Every day | ORAL | 0 refills | Status: DC | PRN

## 2021-05-02 NOTE — Telephone Encounter (Signed)
Spoke with pt advised of Fola's message he verbalized understanding ?

## 2021-05-10 IMAGING — DX DG LUMBAR SPINE COMPLETE 4+V
5 series · 5 of 5 positions shown · non-contrast
Comparison: None.

CLINICAL DATA: Back pain, fall from ladder

EXAM:
LUMBAR SPINE - COMPLETE 4+ VIEW

[l-spine ap]
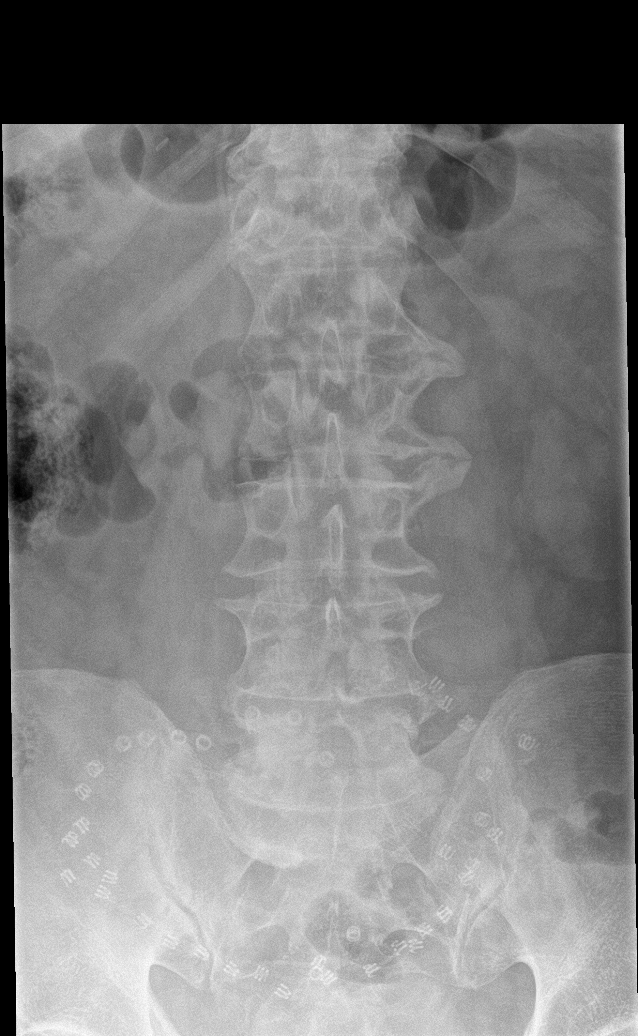

[l-spine obl (1 of 2)]
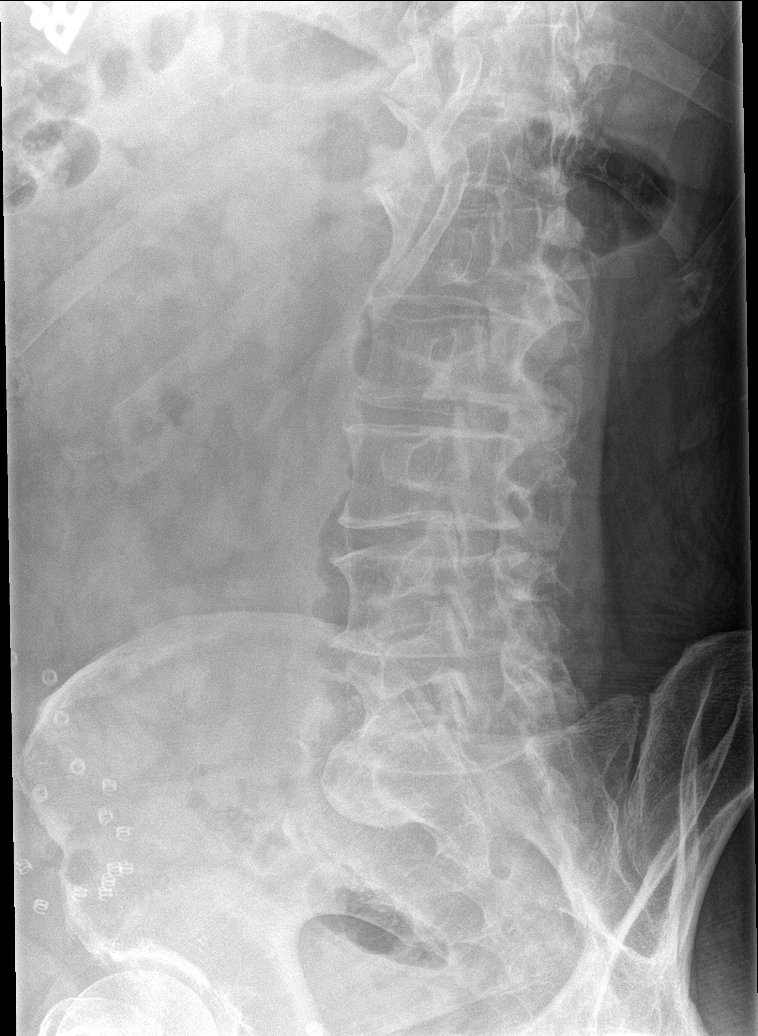

[l-spine obl (2 of 2)]
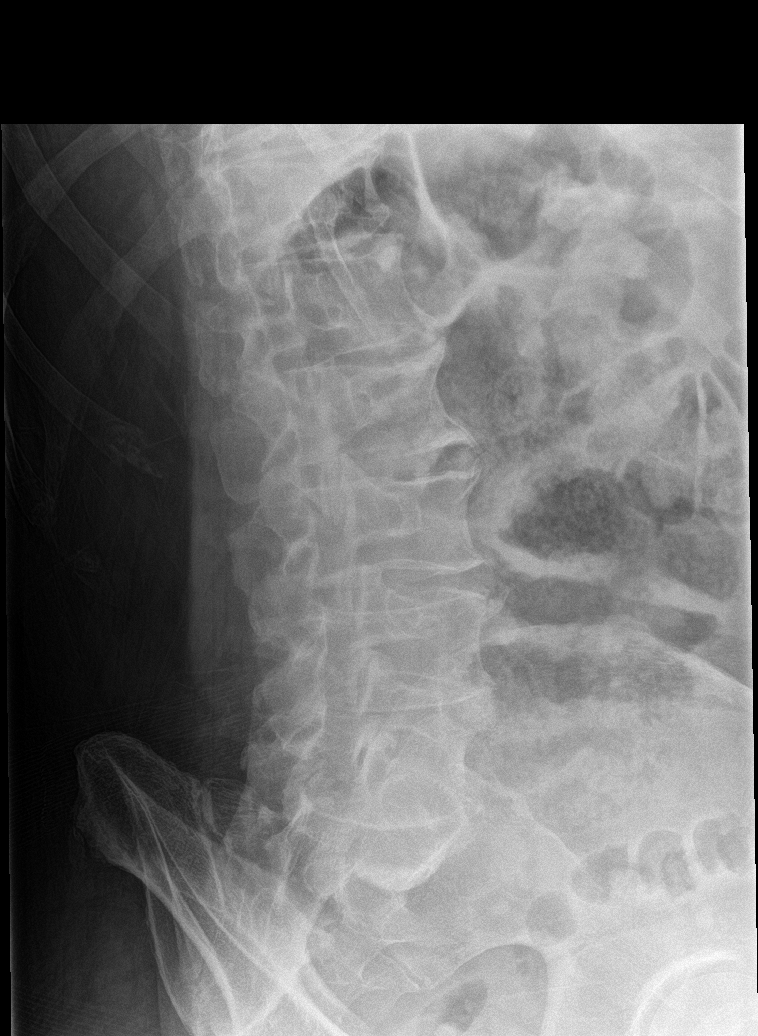

[l-spine lat]
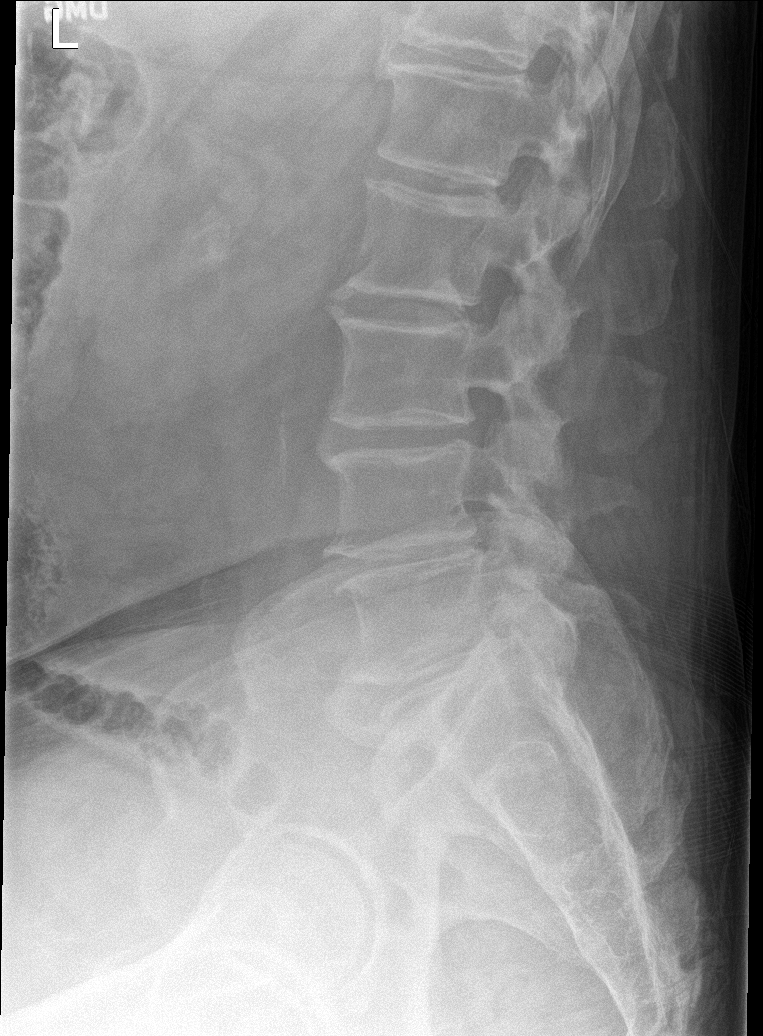

[l-spine spot]
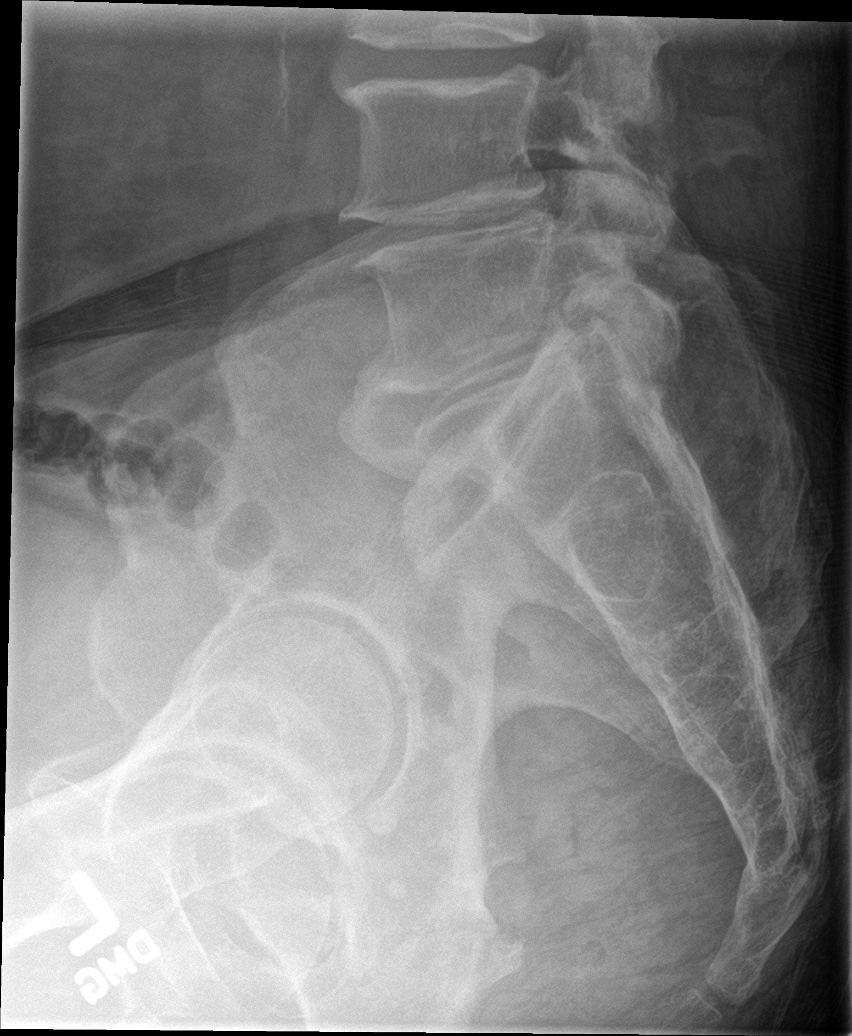

[5 of 5 positions shown; findings below may reference images not displayed]

FINDINGS: Vertebral body heights and alignment are maintained. There is no
evidence of spondylolysis. Multilevel disc space narrowing and small
endplate osteophytes. Multilevel facet hypertrophy. Probable
congenital narrowing of the spinal canal.
IMPRESSION: No acute fracture.

## 2021-05-25 ENCOUNTER — Other Ambulatory Visit: Payer: Self-pay | Admitting: Nurse Practitioner

## 2021-06-19 ENCOUNTER — Other Ambulatory Visit: Payer: Self-pay | Admitting: Nurse Practitioner

## 2021-06-25 ENCOUNTER — Other Ambulatory Visit: Payer: Self-pay

## 2021-06-25 ENCOUNTER — Telehealth: Payer: Self-pay | Admitting: Nurse Practitioner

## 2021-06-25 DIAGNOSIS — E038 Other specified hypothyroidism: Secondary | ICD-10-CM

## 2021-06-25 MED ORDER — LEVOTHYROXINE SODIUM 112 MCG PO TABS: ORAL_TABLET | ORAL | 3 refills | Status: DC

## 2021-06-25 NOTE — Telephone Encounter (Signed)
Patient called back said he takes 2 tablets. ?

## 2021-06-25 NOTE — Telephone Encounter (Signed)
Called pt to see if he is taking 2 tablets or 1 and a half no answer left vm to return call ?

## 2021-06-25 NOTE — Telephone Encounter (Signed)
Rx sent 

## 2021-06-25 NOTE — Telephone Encounter (Signed)
Patient needs refill on levothyroxine (LEVOXYL) 112 MCG tablet  ? ?To [facility]  ?

## 2021-07-04 ENCOUNTER — Encounter: Payer: Self-pay | Admitting: Nurse Practitioner

## 2021-07-04 ENCOUNTER — Other Ambulatory Visit: Payer: Self-pay | Admitting: Nurse Practitioner

## 2021-07-04 ENCOUNTER — Ambulatory Visit (INDEPENDENT_AMBULATORY_CARE_PROVIDER_SITE_OTHER): Payer: Medicare Other | Admitting: Nurse Practitioner

## 2021-07-04 VITALS — BP 124/80 | HR 83 | Ht 72.0 in | Wt 249.0 lb

## 2021-07-04 DIAGNOSIS — E119 Type 2 diabetes mellitus without complications: Secondary | ICD-10-CM | POA: Diagnosis not present

## 2021-07-04 DIAGNOSIS — I10 Essential (primary) hypertension: Secondary | ICD-10-CM

## 2021-07-04 DIAGNOSIS — Z0001 Encounter for general adult medical examination with abnormal findings: Secondary | ICD-10-CM

## 2021-07-04 DIAGNOSIS — Z139 Encounter for screening, unspecified: Secondary | ICD-10-CM

## 2021-07-04 DIAGNOSIS — E782 Mixed hyperlipidemia: Secondary | ICD-10-CM | POA: Diagnosis not present

## 2021-07-04 DIAGNOSIS — Z6833 Body mass index (BMI) 33.0-33.9, adult: Secondary | ICD-10-CM

## 2021-07-04 DIAGNOSIS — Z0000 Encounter for general adult medical examination without abnormal findings: Secondary | ICD-10-CM | POA: Diagnosis not present

## 2021-07-04 DIAGNOSIS — R252 Cramp and spasm: Secondary | ICD-10-CM

## 2021-07-04 DIAGNOSIS — R200 Anesthesia of skin: Secondary | ICD-10-CM | POA: Insufficient documentation

## 2021-07-04 DIAGNOSIS — H547 Unspecified visual loss: Secondary | ICD-10-CM | POA: Insufficient documentation

## 2021-07-04 NOTE — Assessment & Plan Note (Signed)
Lab Results  Component Value Date   HGBA1C 7.3 (H) 03/20/2021   Chronic condition currently on Jardiance 25 mg daily, metformin 1000 mg twice daily Foot exam completed today Has upcoming eye exam Avoid sugar sweets order size and symptoms of hypoglycemia reviewed with patient today denies hypoglycemic symptoms. Checking A1c.

## 2021-07-04 NOTE — Assessment & Plan Note (Signed)
Chronic condition Taking magnesium supplements Checking vitamin D, B12, potasium labs

## 2021-07-04 NOTE — Assessment & Plan Note (Signed)
Wt Readings from Last 3 Encounters:  07/04/21 249 lb (112.9 kg)  02/27/21 249 lb (112.9 kg)  11/20/20 235 lb 0.6 oz (106.6 kg)  Chronic condition, does not exercise regularly but does a lot of walking at home Need to increase intake of whole food consisting daily vegetables and protein less carbohydrate drinking at least 64 ounces of water daily instead of juice, engaging in regular daily exercises at least 150 minutes weekly, importance of pressure control also discussed patient verbalized understanding currently on Jardiance 25 mg daily for diabetes.

## 2021-07-04 NOTE — Assessment & Plan Note (Signed)
Numbness in right knee and lateral aspect of right leg since trauma to the right knee 4 months ago. Will monitor for now and referred to neurology if symptoms persist at next visit.

## 2021-07-04 NOTE — Assessment & Plan Note (Signed)
Currently on atorvastatin 40 mg daily Checking lipid panel today Lab Results  Component Value Date   CHOL 132 03/20/2021   HDL 57 03/20/2021   LDLCALC 61 03/20/2021   TRIG 71 03/20/2021   CHOLHDL 2.3 03/20/2021

## 2021-07-04 NOTE — Assessment & Plan Note (Signed)
Chronic condition with prescription glasses Has upcoming eye exam

## 2021-07-04 NOTE — Patient Instructions (Addendum)
Please get your fasting labs done as discussed.  Please get your second dose of pneumonia vaccine , and get your shingles vaccine   It is important that you exercise regularly at least 30 minutes 5 times a week.  Think about what you will eat, plan ahead. Choose " clean, green, fresh or frozen" over canned, processed or packaged foods which are more sugary, salty and fatty. 70 to 75% of food eaten should be vegetables and fruit. Three meals at set times with snacks allowed between meals, but they must be fruit or vegetables. Aim to eat over a 12 hour period , example 7 am to 7 pm, and STOP after  your last meal of the day. Drink water,generally about 64 ounces per day, no other drink is as healthy. Fruit juice is best enjoyed in a healthy way, by EATING the fruit.  Thanks for choosing [facility], we consider it a privelige to serve you.

## 2021-07-04 NOTE — Assessment & Plan Note (Deleted)
Annual exam as documented.  ?Counseling done include healthy lifestyle involving committing to 150 minutes of exercise per week, heart healthy diet, and attaining healthy weight. The importance of adequate sleep also discussed.  ?Regular use of seat belt and home safety were also discussed . ?Changes in health habits are decided on by patient with goals and time frames set for achieving them. ?Immunization and cancer screening  needs are specifically addressed at this visit.   ?

## 2021-07-04 NOTE — Assessment & Plan Note (Signed)
BP Readings from Last 3 Encounters:  07/04/21 124/80  02/27/21 134/78  11/20/20 123/74  Chronic condition well-controlled on metoprolol 25 mg twice daily, amlodipine 10 mg daily olmesartan 40 mg daily Continue current medication DASH diet advised engage in regular daily exercises at least 150 minutes weekly

## 2021-07-04 NOTE — Progress Notes (Signed)
Complete physical exam  Patient: Samuel Hubbard   DOB: 1956/01/05   66 y.o. Male  MRN: [id number]  Subjective:    Chief Complaint  Patient presents with   Annual Exam    cpe    Rece Zechman is a 66 y.o. male with past medical history of essential hypertension, A-fib, type 2 diabetes, hyperlipidemia, obesity who presents today for follow-up of his chronic medical conditions. He reports consuming a general diet.but tries to make good food choices The patient has a physically strenuous job, but has no regular exercise apart from work.  He generally feels fairly well. He reports sleeping well. He does have additional problems to discuss today.   Pt c/o chronic intermittent cramps , states that he wakes up in the middle of the night with sharp cramps in both legs, walking alleviates the cramps , he recently started taking magnesium supplements, has numbness in his right knee since about 4 months ago when he ha trauma to his right knee.    Pt c/o right knee numbness on the lateral aspect,states that the numbness extends to his leg, had trauma to his knee about 4 months ago. Denies swelling , pain, tingling   Has upcoming appointment at [facility] for his diabetic eye exam   Most recent fall risk assessment:    07/04/2021    8:10 AM  [place] in the past year? 0  Number falls in past yr: 0  Injury with Fall? 0  Risk for fall due to : No Fall Risks  Follow up Falls evaluation completed     Most recent depression screenings:    07/04/2021    8:10 AM 02/27/2021    8:13 AM  PHQ 2/9 Scores  PHQ - 2 Score 0 0        Patient Care Team: Renee Rival, FNP as PCP - General (Nurse Practitioner) Neale Burly, MD (Internal Medicine)   Outpatient Medications Prior to Visit  Medication Sig Note   amLODipine (NORVASC) 10 MG tablet Take 1 tablet (10 mg total) by mouth daily.    apixaban (ELIQUIS) 5 MG TABS tablet Take by mouth.    atorvastatin (LIPITOR) 40 MG tablet Take 1  tablet (40 mg total) by mouth daily.    citalopram (CELEXA) 20 MG tablet Take 1 tablet (20 mg total) by mouth daily.    cyclobenzaprine (FLEXERIL) 10 MG tablet TAKE 1 TABLET BY MOUTH DAILY AS NEEDED FOR MUSCLE SPASMS.    empagliflozin (JARDIANCE) 25 MG TABS tablet Take 1 tablet (25 mg total) by mouth daily.    levothyroxine (LEVOXYL) 112 MCG tablet Take 2 prior to breakfast    metFORMIN (GLUCOPHAGE) 500 MG tablet Take 2 tablets (1,000 mg total) by mouth 2 (two) times daily with a meal.    metoprolol tartrate (LOPRESSOR) 25 MG tablet Take by mouth.    olmesartan (BENICAR) 40 MG tablet Take 1 tablet (40 mg total) by mouth daily.    sildenafil (VIAGRA) 50 MG tablet Take 1 tablet (50 mg total) by mouth daily as needed for erectile dysfunction.    naproxen (NAPROSYN) 500 MG tablet Take 1 tablet (500 mg total) by mouth 2 (two) times daily with a meal. (Patient not taking: Reported on 07/04/2021) 07/04/2021: As needed   pantoprazole (PROTONIX) 40 MG tablet Take 1 tablet (40 mg total) by mouth daily. (Patient not taking: Reported on 07/04/2021)    No facility-administered medications prior to visit.    Review of Systems  Constitutional: Negative.  Negative for chills, fever, malaise/fatigue and weight loss.  HENT: Negative.  Negative for congestion, ear discharge, ear pain, hearing loss, nosebleeds and tinnitus.   Eyes:  Positive for blurred vision. Negative for pain, discharge and redness.  Respiratory: Negative.    Cardiovascular: Negative.  Negative for chest pain, palpitations, orthopnea and claudication.  Gastrointestinal: Negative.  Negative for heartburn, nausea and vomiting.  Genitourinary: Negative.  Negative for dysuria, frequency and urgency.  Musculoskeletal: Negative.  Negative for back pain, myalgias and neck pain.  Skin: Negative.  Negative for itching and rash.  Neurological: Negative.  Negative for dizziness, tingling, tremors, seizures, weakness and headaches.  Endo/Heme/Allergies:  Negative.   Psychiatric/Behavioral: Negative.  Negative for depression, hallucinations, substance abuse and suicidal ideas. The patient is not nervous/anxious.          Objective:     BP 124/80 (BP Location: Right Arm, Patient Position: Sitting, Cuff Size: Large)   Pulse 83   Ht 6' (1.829 m)   Wt 249 lb (112.9 kg)   SpO2 95%   BMI 33.77 kg/m    Physical Exam Constitutional:      General: He is not in acute distress.    Appearance: He is obese. He is not ill-appearing, toxic-appearing or diaphoretic.  HENT:     Head: Normocephalic.     Right Ear: Tympanic membrane, ear canal and external ear normal. There is no impacted cerumen.     Left Ear: Ear canal and external ear normal. There is no impacted cerumen.     Nose: No congestion or rhinorrhea.     Mouth/Throat:     Mouth: Mucous membranes are moist.     Pharynx: Oropharynx is clear. No oropharyngeal exudate or posterior oropharyngeal erythema.  Eyes:     General: No scleral icterus.       Right eye: No discharge.        Left eye: No discharge.     Extraocular Movements: Extraocular movements intact.     Conjunctiva/sclera: Conjunctivae normal.     Pupils: Pupils are equal, round, and reactive to light.  Neck:     Vascular: No carotid bruit.  Cardiovascular:     Rate and Rhythm: Regular rhythm. Tachycardia present.     Pulses: Normal pulses.     Heart sounds: Normal heart sounds. No murmur heard.   No friction rub. No gallop.  Pulmonary:     Effort: No respiratory distress.     Breath sounds: Normal breath sounds. No stridor. No wheezing, rhonchi or rales.  Chest:     Chest wall: No tenderness.  Abdominal:     General: There is no distension.     Palpations: Abdomen is soft. There is no mass.     Tenderness: There is no abdominal tenderness. There is no right CVA tenderness, left CVA tenderness, guarding or rebound.     Hernia: No hernia is present.  Musculoskeletal:        General: No swelling, tenderness,  deformity or signs of injury.     Cervical back: Normal range of motion and neck supple. No rigidity or tenderness.     Right lower leg: No edema.     Left lower leg: No edema.  Lymphadenopathy:     Cervical: No cervical adenopathy.  Skin:    General: Skin is warm and dry.     Capillary Refill: Capillary refill takes 2 to 3 seconds.     Coloration: Skin is not jaundiced or pale.  Findings: No bruising, erythema, lesion or rash.  Neurological:     Mental Status: He is alert and oriented to person, place, and time.     Cranial Nerves: No cranial nerve deficit.     Sensory: Sensory deficit present.     Motor: No weakness.     Coordination: Coordination normal.     Gait: Gait normal.     Deep Tendon Reflexes: Reflexes normal.     Comments: Decreased sensation in bilateral heel, right knee hypoesthesia   Psychiatric:        Mood and Affect: Mood normal.        Behavior: Behavior normal.        Thought Content: Thought content normal.        Judgment: Judgment normal.     No results found for any visits on 07/04/21.     Assessment & Plan:    Routine Health Maintenance and Physical Exam  Immunization History  Administered Date(s) Administered   Influenza,inj,Quad PF,6+ Mos 10/13/2018, 11/20/2020   PFIZER(Purple Top)SARS-COV-2 Vaccination 05/04/2019, 05/25/2019, 11/28/2019   Pneumococcal Conjugate-13 10/13/2018   Tdap 07/02/2018    Health Maintenance  Topic Date Due   OPHTHALMOLOGY EXAM  Never done   Zoster Vaccines- Shingrix (1 of 2) Never done   Pneumonia Vaccine 79+ Years old (2 - PPSV23 if available, else PCV20) 06/10/2021   INFLUENZA VACCINE  09/17/2021   HEMOGLOBIN A1C  09/17/2021   FOOT EXAM  07/05/2022   COLONOSCOPY (Pts 45-58yr Insurance coverage will need to be confirmed)  10/14/2026   TETANUS/TDAP  07/01/2028   Hepatitis C Screening  Completed   HIV Screening  Completed   HPV VACCINES  Aged Out   COVID-19 Vaccine  Discontinued    Discussed health  benefits of physical activity, and encouraged him to engage in regular exercise appropriate for his age and condition.  Problem List Items Addressed This Visit       Cardiovascular and Mediastinum   Essential hypertension    BP Readings from Last 3 Encounters:  07/04/21 124/80  02/27/21 134/78  11/20/20 123/74  Chronic condition well-controlled on metoprolol 25 mg twice daily, amlodipine 10 mg daily olmesartan 40 mg daily Continue current medication DASH diet advised engage in regular daily exercises at least 150 minutes weekly      Relevant Orders   CBC with Differential   CMP14+EGFR     Endocrine   Type 2 diabetes mellitus without complications (HCC)    Lab Results  Component Value Date   HGBA1C 7.3 (H) 03/20/2021  Chronic condition currently on Jardiance 25 mg daily, metformin 1000 mg twice daily Foot exam completed today Has upcoming eye exam Avoid sugar sweets order size and symptoms of hypoglycemia reviewed with patient today denies hypoglycemic symptoms. Checking A1c.        Other   Hyperlipidemia    Currently on atorvastatin 40 mg daily Checking lipid panel today Lab Results  Component Value Date   CHOL 132 03/20/2021   HDL 57 03/20/2021   LDLCALC 61 03/20/2021   TRIG 71 03/20/2021   CHOLHDL 2.3 03/20/2021        Relevant Orders   CBC with Differential   CMP14+EGFR   Lipid Profile   Cramps, extremity    Chronic condition Taking magnesium supplements Checking vitamin D, B12, potasium labs       Relevant Orders   B12 and Folate Panel   Vitamin D (25 hydroxy)   Body mass index (BMI) 33.0-33.9, adult  Wt Readings from Last 3 Encounters:  07/04/21 249 lb (112.9 kg)  02/27/21 249 lb (112.9 kg)  11/20/20 235 lb 0.6 oz (106.6 kg)  Chronic condition, does not exercise regularly but does a lot of walking at home Need to increase intake of whole food consisting daily vegetables and protein less carbohydrate drinking at least 64 ounces of water daily  instead of juice, engaging in regular daily exercises at least 150 minutes weekly, importance of pressure control also discussed patient verbalized understanding currently on Jardiance 25 mg daily for diabetes.      Relevant Orders   Vitamin D (25 hydroxy)   Annual physical exam - Primary    Annual exam as documented.  Counseling done include healthy lifestyle involving committing to 150 minutes of exercise per week, heart healthy diet, and attaining healthy weight. The importance of adequate sleep also discussed.  Regular use of seat belt and home safety were also discussed . Changes in health habits are decided on by patient with goals and time frames set for achieving them. Immunization and cancer screening  needs are specifically addressed at this visit.         Relevant Orders   PSA   Numbness in right leg    Numbness in right knee and lateral aspect of right leg since trauma to the right knee 4 months ago. Will monitor for now and referred to neurology if symptoms persist at next visit.       Return in about 4 months (around 11/04/2021) for HTN/HLD/DM.     Renee Rival, FNP

## 2021-08-02 ENCOUNTER — Encounter: Payer: Self-pay | Admitting: Nurse Practitioner

## 2021-08-03 LAB — B12 AND FOLATE PANEL
Folate: 16.6 ng/mL (ref >3.0)
Vitamin B-12: 983 pg/mL (ref 232–1245)

## 2021-08-03 LAB — VITAMIN D 25 HYDROXY (VIT D DEFICIENCY, FRACTURES): Vit D, 25-Hydroxy: 29.8 ng/mL — ABNORMAL LOW (ref 30.0–100.0)

## 2021-08-04 ENCOUNTER — Other Ambulatory Visit: Payer: Self-pay | Admitting: Nurse Practitioner

## 2021-08-04 DIAGNOSIS — E119 Type 2 diabetes mellitus without complications: Secondary | ICD-10-CM

## 2021-08-04 DIAGNOSIS — E039 Hypothyroidism, unspecified: Secondary | ICD-10-CM

## 2021-08-04 DIAGNOSIS — I10 Essential (primary) hypertension: Secondary | ICD-10-CM

## 2021-08-04 DIAGNOSIS — E782 Mixed hyperlipidemia: Secondary | ICD-10-CM

## 2021-08-04 DIAGNOSIS — E038 Other specified hypothyroidism: Secondary | ICD-10-CM

## 2021-08-04 NOTE — Progress Notes (Signed)
Including TSH, thanks

## 2021-08-04 NOTE — Progress Notes (Signed)
I have added on labs today to be done, please notify labs , thanks  lipid, CMP, A1C, CBC. If patient needs to come back for fasting lab drwa please notify him, thanks

## 2021-08-05 ENCOUNTER — Telehealth: Payer: Self-pay

## 2021-08-05 NOTE — Telephone Encounter (Signed)
Spoke with pt

## 2021-08-05 NOTE — Telephone Encounter (Signed)
Called pt no answer left vm 

## 2021-08-05 NOTE — Telephone Encounter (Signed)
Patient returning nurse call said does not know why. [phone].

## 2021-08-16 ENCOUNTER — Other Ambulatory Visit: Payer: Self-pay | Admitting: Nurse Practitioner

## 2021-08-29 ENCOUNTER — Ambulatory Visit
Admission: EM | Admit: 2021-08-29 | Discharge: 2021-08-29 | Disposition: A | Discharge status: Home / Self Care | Payer: Medicare Other | Attending: Nurse Practitioner | Admitting: Nurse Practitioner

## 2021-08-29 DIAGNOSIS — B9789 Other viral agents as the cause of diseases classified elsewhere: Secondary | ICD-10-CM

## 2021-08-29 DIAGNOSIS — J019 Acute sinusitis, unspecified: Secondary | ICD-10-CM | POA: Diagnosis not present

## 2021-08-29 MED ORDER — CETIRIZINE HCL 10 MG PO TABS: 10.0000 mg | ORAL_TABLET | Freq: Every day | ORAL | 0 refills | Status: DC

## 2021-08-29 MED ORDER — FLUTICASONE PROPIONATE 50 MCG/ACT NA SUSP: 2.0000 | Freq: Every day | NASAL | 0 refills | Status: DC

## 2021-08-29 MED ORDER — PSEUDOEPH-BROMPHEN-DM 30-2-10 MG/5ML PO SYRP: 5.0000 mL | ORAL_SOLUTION | Freq: Four times a day (QID) | ORAL | 0 refills | Status: DC | PRN

## 2021-08-29 NOTE — ED Triage Notes (Signed)
Pt presents with c/o nasal congestion and left side facial pain and pressure
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
## 2021-08-29 NOTE — Discharge Instructions (Addendum)
Take medication as directed. Increase fluids and get plenty of rest. May take over-the-counter ibuprofen or Tylenol as needed for pain, fever, or general discomfort. Recommend normal saline nasal spray to help with nasal congestion throughout the day. For your cough, it may be helpful to use a humidifier at bedtime during sleep. If your symptoms fail to improve within the next 7 to 10 days, please follow-up in our clinic.

## 2021-09-05 ENCOUNTER — Encounter: Payer: Self-pay | Admitting: Family Medicine

## 2021-09-05 ENCOUNTER — Ambulatory Visit (INDEPENDENT_AMBULATORY_CARE_PROVIDER_SITE_OTHER): Payer: Medicare Other | Admitting: Family Medicine

## 2021-09-05 VITALS — BP 135/80 | HR 69 | Ht 72.0 in | Wt 252.1 lb

## 2021-09-05 DIAGNOSIS — I10 Essential (primary) hypertension: Secondary | ICD-10-CM

## 2021-09-05 DIAGNOSIS — J320 Chronic maxillary sinusitis: Secondary | ICD-10-CM | POA: Insufficient documentation

## 2021-09-05 MED ORDER — SULFAMETHOXAZOLE-TRIMETHOPRIM 800-160 MG PO TABS: 1.0000 | ORAL_TABLET | Freq: Two times a day (BID) | ORAL | 0 refills | Status: DC

## 2021-09-05 NOTE — Progress Notes (Signed)
   Samuel Hubbard     MRN: [id number]      DOB: 03/03/1955   HPI Samuel Hubbard is here with  week h/o progeressively worsening left maxillary pressure, fatigue, green foul tasting nasal draiage ROS Denies recent fever or chills.Denies sinus pressure, nasal congestion, ear pain or sore throat. Denies chest congestion, productive cough or wheezing. Denies chest pains, palpitations and leg swelling Denies abdominal pain, nausea, vomiting,diarrhea or constipation.   Denies dysuria, frequency, hesitancy or incontinence. Denies joint pain, swelling and limitation in mobility. Denies headaches, seizures, numbness, or tingling. Denies depression, anxiety or insomnia. Denies skin break down or rash.   PE  BP 135/80 (BP Location: Right Arm, Patient Position: Sitting, Cuff Size: Large)   Pulse 69   Ht 6' (1.829 m)   Wt 252 lb 1.9 oz (114.4 kg)   SpO2 95%   BMI 34.19 kg/m   Patient alert and oriented and in no cardiopulmonary distress.  HEENT: No facial asymmetry, EOMI,     Neck supple .left maxillary sinus tender and swelling over left cheek, left anterior cervical adenitis, left TM clear, oropharynx normal, no erythema or exudate  Chest: Clear to auscultation bilaterally.  CVS: S1, S2 no murmurs, no S3.Regular rate.  ABD: Soft non tender.   Ext: No edema  MS: Adequate ROM spine, shoulders, hips and knees.  Skin: Intact, no ulcerations or rash noted.  Psych: Good eye contact, normal affect. Memory intact not anxious or depressed appearing.  CNS: CN 2-12 intact, power,  normal throughout.no focal deficits noted.   Assessment & Plan  Left maxillary sinusitis 2 week antibiotic prescribed, saline nasal flushes encouraged , work excuse to return 724/2023  Essential hypertension Controlled, no change in medication DASH diet and commitment to daily physical activity for a minimum of 30 minutes discussed and encouraged, as a part of hypertension management. The importance of  attaining a healthy weight is also discussed.     09/05/2021   10:11 AM 08/29/2021    8:29 AM 07/04/2021    8:08 AM 02/27/2021    9:07 AM 02/27/2021    8:09 AM 11/20/2020    1:07 PM 10/05/2020    8:49 AM  BP/Weight  Systolic BP 007 622 633 354 562 563   Diastolic BP 80 89 80 78 87 74   Wt. (Lbs) 252.12  249  249 235.04 250  BMI 34.19 kg/m2  33.77 kg/m2  32.85 kg/m2 31.01 kg/m2 33.91 kg/m2

## 2021-09-05 NOTE — Patient Instructions (Signed)
F/U with [facility] as before, call if you need to be seen  sooner  You are treated for acute sinus infection , and a 2 week antibiotic course of Septra,  is prescribed which should completely cure it   Work excuse from today to return 09/09/2021  Thanks for choosing [facility], we consider it a privelige to serve you.

## 2021-09-08 NOTE — Assessment & Plan Note (Signed)
2 week antibiotic prescribed, saline nasal flushes encouraged , work excuse to return 724/2023

## 2021-09-08 NOTE — Assessment & Plan Note (Signed)
Controlled, no change in medication DASH diet and commitment to daily physical activity for a minimum of 30 minutes discussed and encouraged, as a part of hypertension management. The importance of attaining a healthy weight is also discussed.     09/05/2021   10:11 AM 08/29/2021    8:29 AM 07/04/2021    8:08 AM 02/27/2021    9:07 AM 02/27/2021    8:09 AM 11/20/2020    1:07 PM 10/05/2020    8:49 AM  BP/Weight  Systolic BP 537 482 707 867 544 920   Diastolic BP 80 89 80 78 87 74   Wt. (Lbs) 252.12  249  249 235.04 250  BMI 34.19 kg/m2  33.77 kg/m2  32.85 kg/m2 31.01 kg/m2 33.91 kg/m2

## 2021-09-17 ENCOUNTER — Other Ambulatory Visit: Payer: Self-pay | Admitting: Orthopaedic Surgery

## 2021-09-17 NOTE — Telephone Encounter (Signed)
Called patient and left a generic message asking for a call back. His PCP d/c'ed the naproxen. Looking back at the medicine it was discontinued because he had finished the course. Will verify with patient.

## 2021-09-26 ENCOUNTER — Telehealth: Payer: Self-pay | Admitting: Nurse Practitioner

## 2021-09-26 NOTE — Telephone Encounter (Signed)
Called pt he states that he has had his cpap machine for about 5 years now and it is humming he is needing a new machine. Please advise

## 2021-09-26 NOTE — Telephone Encounter (Signed)
Patient called in regard  cpap   Patient states that current cpap is failing . Patient wants a call back in regard.

## 2021-09-27 ENCOUNTER — Other Ambulatory Visit: Payer: Self-pay

## 2021-09-27 DIAGNOSIS — R0683 Snoring: Secondary | ICD-10-CM

## 2021-09-27 NOTE — Telephone Encounter (Signed)
Pt aware of process for in home study

## 2021-10-01 ENCOUNTER — Other Ambulatory Visit: Payer: Self-pay

## 2021-10-01 ENCOUNTER — Telehealth: Payer: Self-pay | Admitting: Nurse Practitioner

## 2021-10-01 MED ORDER — ATORVASTATIN CALCIUM 40 MG PO TABS: 40.0000 mg | ORAL_TABLET | Freq: Every day | ORAL | 3 refills | Status: DC

## 2021-10-01 NOTE — Telephone Encounter (Signed)
Pt called stating that pharmacy was supposed to have sent a refill request on Friday for atorvastatin (LIPITOR) 40 MG tablet. States phar has not received. Can you please refill?   Pt would like to be called by nurse when this medication is sent.     [facility]

## 2021-10-01 NOTE — Telephone Encounter (Signed)
Med sent to pharmacy. Pt aware

## 2021-10-17 ENCOUNTER — Telehealth: Payer: Self-pay | Admitting: Nurse Practitioner

## 2021-10-17 NOTE — Telephone Encounter (Signed)
Spoke with pt sent community message to [employer]

## 2021-10-17 NOTE — Telephone Encounter (Signed)
Patient called in regard to sleep study/ cpap  Patient states that he's sent in sleep study info . Wants to know when he will get cpap   Wants a call back in regard.

## 2021-10-22 NOTE — Telephone Encounter (Signed)
Spoke with Samuel Hubbard he will bring order Friday spoke with pt he is aware

## 2021-10-25 ENCOUNTER — Telehealth: Payer: Self-pay | Admitting: Nurse Practitioner

## 2021-10-25 ENCOUNTER — Other Ambulatory Visit: Payer: Self-pay

## 2021-10-25 DIAGNOSIS — F419 Anxiety disorder, unspecified: Secondary | ICD-10-CM

## 2021-10-25 MED ORDER — CITALOPRAM HYDROBROMIDE 20 MG PO TABS: 20.0000 mg | ORAL_TABLET | Freq: Every day | ORAL | 3 refills | Status: DC

## 2021-10-25 NOTE — Telephone Encounter (Signed)
Pt called stating he always has difficulty when he tried to refill his medication. Can you please refill? Pt asked for 90-day qty?   citalopram (CELEXA) 20 MG tablet  [facility]

## 2021-10-25 NOTE — Telephone Encounter (Signed)
Rx sent 

## 2021-10-29 NOTE — Telephone Encounter (Signed)
Sent message to [facility] [employer]) to give an update on his cpap

## 2021-10-29 NOTE — Telephone Encounter (Signed)
Patient called in still waiting on cpap machine. Wants a call back in regard, patient needs cpap.

## 2021-10-30 NOTE — Telephone Encounter (Signed)
LVM that [facility] has been unable to reach him on the phone and left their number for him to call to get the process going to get his cpap

## 2021-11-04 ENCOUNTER — Ambulatory Visit: Payer: Medicare Other | Admitting: Nurse Practitioner

## 2021-11-06 ENCOUNTER — Encounter: Payer: Self-pay | Admitting: Nurse Practitioner

## 2021-11-06 ENCOUNTER — Ambulatory Visit: Payer: Medicare Other | Admitting: Nurse Practitioner

## 2021-11-06 ENCOUNTER — Other Ambulatory Visit: Payer: Self-pay | Admitting: Nurse Practitioner

## 2021-11-06 ENCOUNTER — Ambulatory Visit (INDEPENDENT_AMBULATORY_CARE_PROVIDER_SITE_OTHER): Payer: Medicare Other | Admitting: Nurse Practitioner

## 2021-11-06 VITALS — BP 150/90 | HR 59 | Resp 16 | Ht 73.0 in | Wt 252.1 lb

## 2021-11-06 DIAGNOSIS — E1169 Type 2 diabetes mellitus with other specified complication: Secondary | ICD-10-CM

## 2021-11-06 DIAGNOSIS — E039 Hypothyroidism, unspecified: Secondary | ICD-10-CM

## 2021-11-06 DIAGNOSIS — I10 Essential (primary) hypertension: Secondary | ICD-10-CM | POA: Diagnosis not present

## 2021-11-06 DIAGNOSIS — E119 Type 2 diabetes mellitus without complications: Secondary | ICD-10-CM

## 2021-11-06 DIAGNOSIS — Z23 Encounter for immunization: Secondary | ICD-10-CM | POA: Diagnosis not present

## 2021-11-06 DIAGNOSIS — E782 Mixed hyperlipidemia: Secondary | ICD-10-CM | POA: Diagnosis not present

## 2021-11-06 MED ORDER — HYDROCHLOROTHIAZIDE 12.5 MG PO TABS: 12.5000 mg | ORAL_TABLET | Freq: Every day | ORAL | 3 refills | Status: DC

## 2021-11-06 MED ORDER — LANCETS 30G MISC: 5 refills | Status: DC

## 2021-11-06 MED ORDER — BLOOD GLUCOSE METER KIT: PACK | 0 refills | Status: DC

## 2021-11-06 MED ORDER — GLUCOSE BLOOD VI STRP: ORAL_STRIP | 12 refills | Status: DC

## 2021-11-06 NOTE — Assessment & Plan Note (Signed)
Patient educated on CDC recommendation for the influenza vaccine. Verbal consent was obtained from the patient, vaccine administered by nurse, no sign of adverse reactions noted at this time. Patient education on arm soreness and use of tylenol  for this patient  was discussed. Patient educated on the signs and symptoms of adverse effect and advise to contact the office if they occur. Vaccine information sheet given to patient.

## 2021-11-06 NOTE — Patient Instructions (Addendum)
Start taking hydrochlorothiazide 12.'5mg'$  daily for your hypertension, please continue your other medications   Flu vaccine today, nurse please order a glucometer    Please get your shingles vaccine and pneumonia vaccine at the pharmacy     Around 3 times per week, check your blood pressure 4 times per day. Twice in the morning and twice in the evening. The readings should be at least one minute apart. Write down these values and bring them to your next nurse visit/appointment.  When you check your BP, make sure you have been doing something calm/relaxing 5 minutes prior to checking. Both feet should be flat on the floor and you should be sitting. Use your left arm and make sure it is in a relaxed position (on a table), and that the cuff is at the approximate level/height of your heart.   It is important that you exercise regularly at least 30 minutes 5 times a week.  Think about what you will eat, plan ahead. Choose " clean, green, fresh or frozen" over canned, processed or packaged foods which are more sugary, salty and fatty. 70 to 75% of food eaten should be vegetables and fruit. Three meals at set times with snacks allowed between meals, but they must be fruit or vegetables. Aim to eat over a 12 hour period , example 7 am to 7 pm, and STOP after  your last meal of the day. Drink water,generally about 64 ounces per day, no other drink is as healthy. Fruit juice is best enjoyed in a healthy way, by EATING the fruit.  Thanks for choosing [facility], we consider it a privelige to serve you.
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
## 2021-11-06 NOTE — Assessment & Plan Note (Signed)
BP Readings from Last 3 Encounters:  11/06/21 (!) 150/90  09/05/21 135/80  08/29/21 (!) 141/89  Chronic medical condition uncontrolled Currently on amlodipine 10 mg daily, olmesartan 40 mg daily, metoprolol 25 mg twice daily.  Patient reports that he has been taking all medications as ordered he has not been monitoring his blood pressure at home. Start hydrochlorothiazide 12.5 mg daily Monitor blood pressure daily at home keep a log and bring to next visit in 4 weeks.  BP goal is less than 130/80 DASH diet advised patient encouraged to engage in regular moderate exercises at least 150 minutes weekly CMP today Recheck BMP in 2 weeks

## 2021-11-06 NOTE — Assessment & Plan Note (Signed)
Lab Results  Component Value Date   HGBA1C 7.3 (H) 03/20/2021  Chronic medical condition uncontrolled Currenttly on Jardiance 25 mg daily, metformin 1000 mg twice daily He has not been checking his blood sugars at home he denies signs of hypoglycemia.  Glucometer ordered today.  Patient encouraged to monitor his blood sugar at least twice daily. Stated he has had his eye exam done within the last 1 year,  Check A1c Avoid sugar sweets soda.

## 2021-11-06 NOTE — Assessment & Plan Note (Signed)
Currently on levothyroxine 112 mcg 1 tablet daily Check TSH T4 levels

## 2021-11-06 NOTE — Assessment & Plan Note (Signed)
Currently on atorvastatin 40 mg daily LDL goal is less than 70 Check lipid panel Avoid fatty fried foods

## 2021-11-07 ENCOUNTER — Other Ambulatory Visit: Payer: Self-pay | Admitting: Nurse Practitioner

## 2021-11-07 NOTE — Progress Notes (Signed)
A1c diabetes remains uncontrolled, continue metformin 1000 mg twice daily, Jardiance 25 mg daily.  I am recommending adding Ozempic once weekly injection.   We could also start Rybelsus if patient does not want an injectable medication.  Please discuss this with the patient.  Thyroid function is stable continue current dose of levothyroxine  LDL is slightly above goal of less than 70, continue current medication patient should avoid fried fatty foods  Other labs are stable

## 2021-11-08 LAB — CBC WITH DIFFERENTIAL/PLATELET
Basophils Absolute: 0 10*3/uL (ref 0.0–0.2)
Basos: 1 %
EOS (ABSOLUTE): 0.1 10*3/uL (ref 0.0–0.4)
Eos: 1 %
Hematocrit: 49.9 % (ref 37.5–51.0)
Hemoglobin: 16 g/dL (ref 13.0–17.7)
Immature Grans (Abs): 0 10*3/uL (ref 0.0–0.1)
Immature Granulocytes: 0 %
Lymphocytes Absolute: 1.6 10*3/uL (ref 0.7–3.1)
Lymphs: 25 %
MCH: 27.5 pg (ref 26.6–33.0)
MCHC: 32.1 g/dL (ref 31.5–35.7)
MCV: 86 fL (ref 79–97)
Monocytes Absolute: 0.6 10*3/uL (ref 0.1–0.9)
Monocytes: 9 %
Neutrophils Absolute: 4 10*3/uL (ref 1.4–7.0)
Neutrophils: 64 %
Platelets: 223 10*3/uL (ref 150–450)
RBC: 5.82 x10E6/uL — ABNORMAL HIGH (ref 4.14–5.80)
RDW: 12.1 % (ref 11.6–15.4)
WBC: 6.3 10*3/uL (ref 3.4–10.8)

## 2021-11-08 LAB — CMP14+EGFR
ALT: 31 IU/L (ref 0–44)
AST: 18 IU/L (ref 0–40)
Albumin/Globulin Ratio: 1.9 (ref 1.2–2.2)
Albumin: 4.6 g/dL (ref 3.9–4.9)
Alkaline Phosphatase: 91 IU/L (ref 44–121)
BUN/Creatinine Ratio: 14 (ref 10–24)
BUN: 12 mg/dL (ref 8–27)
Bilirubin Total: 0.4 mg/dL (ref 0.0–1.2)
CO2: 26 mmol/L (ref 20–29)
Calcium: 9.6 mg/dL (ref 8.6–10.2)
Chloride: 101 mmol/L (ref 96–106)
Creatinine, Ser: 0.87 mg/dL (ref 0.76–1.27)
Globulin, Total: 2.4 g/dL (ref 1.5–4.5)
Glucose: 112 mg/dL — ABNORMAL HIGH (ref 70–99)
Potassium: 4.6 mmol/L (ref 3.5–5.2)
Sodium: 142 mmol/L (ref 134–144)
Total Protein: 7 g/dL (ref 6.0–8.5)
eGFR: 96 mL/min/{1.73_m2} (ref >59)

## 2021-11-08 LAB — LIPID PANEL
Chol/HDL Ratio: 2.5 ratio (ref 0.0–5.0)
Cholesterol, Total: 152 mg/dL (ref 100–199)
HDL: 62 mg/dL (ref >39)
LDL Chol Calc (NIH): 72 mg/dL (ref 0–99)
Triglycerides: 101 mg/dL (ref 0–149)
VLDL Cholesterol Cal: 18 mg/dL (ref 5–40)

## 2021-11-08 LAB — TSH+FREE T4
Free T4: 1.52 ng/dL (ref 0.82–1.77)
TSH: 1.53 u[IU]/mL (ref 0.450–4.500)

## 2021-11-08 LAB — MICROALBUMIN / CREATININE URINE RATIO
Creatinine, Urine: 65 mg/dL
Microalb/Creat Ratio: 17 mg/g creat (ref 0–29)
Microalbumin, Urine: 11.1 ug/mL

## 2021-11-08 LAB — HEMOGLOBIN A1C
Est. average glucose Bld gHb Est-mCnc: 160 mg/dL
Hgb A1c MFr Bld: 7.2 % — ABNORMAL HIGH (ref 4.8–5.6)

## 2021-11-29 ENCOUNTER — Ambulatory Visit: Payer: Medicare Other | Admitting: Internal Medicine

## 2021-11-29 ENCOUNTER — Other Ambulatory Visit: Payer: Self-pay | Admitting: Nurse Practitioner

## 2021-11-29 DIAGNOSIS — E119 Type 2 diabetes mellitus without complications: Secondary | ICD-10-CM

## 2021-12-06 ENCOUNTER — Ambulatory Visit (INDEPENDENT_AMBULATORY_CARE_PROVIDER_SITE_OTHER): Payer: Medicare Other | Admitting: Internal Medicine

## 2021-12-06 ENCOUNTER — Encounter: Payer: Self-pay | Admitting: Internal Medicine

## 2021-12-06 DIAGNOSIS — E1169 Type 2 diabetes mellitus with other specified complication: Secondary | ICD-10-CM

## 2021-12-06 DIAGNOSIS — I10 Essential (primary) hypertension: Secondary | ICD-10-CM | POA: Diagnosis not present

## 2021-12-06 DIAGNOSIS — E119 Type 2 diabetes mellitus without complications: Secondary | ICD-10-CM | POA: Diagnosis not present

## 2021-12-06 MED ORDER — METFORMIN HCL 500 MG PO TABS: 1000.0000 mg | ORAL_TABLET | Freq: Two times a day (BID) | ORAL | 3 refills | Status: DC

## 2021-12-06 NOTE — Patient Instructions (Signed)
It was a pleasure to see you today.  Thank you for giving [place] the opportunity to be involved in your care.  Below is a brief recap of your visit and next steps.  We will plan to see you again in 4 weeks.  Summary No medication changes today We will follow up in 4 weeks for blood pressure check. Please keep a home BP log in the interim
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
## 2021-12-16 NOTE — Assessment & Plan Note (Signed)
Presenting today for HTN follow-up.  His current regimen consist of amlodipine 10 mg daily, metoprolol 25 mg twice daily and olmesartan 40 mg daily.  HCTZ 12.5 mg daily was recently added to his regimen, however he did not fill the prescription.  BP today is 130/79. -Through shared decision making, the patient is in agreement with keeping a blood pressure log at home over the next month and returning to care at that time.  If his blood pressure remains elevated, he is in agreement with starting HCTZ. -Follow-up in 1 month for BP check

## 2021-12-16 NOTE — Assessment & Plan Note (Signed)
A1c 7.2 last month.  Metformin refilled today.

## 2022-01-03 ENCOUNTER — Ambulatory Visit (INDEPENDENT_AMBULATORY_CARE_PROVIDER_SITE_OTHER): Payer: Medicare Other | Admitting: Internal Medicine

## 2022-01-03 ENCOUNTER — Encounter: Payer: Self-pay | Admitting: Internal Medicine

## 2022-01-03 VITALS — BP 138/79 | HR 70 | Ht 73.0 in | Wt 261.2 lb

## 2022-01-03 DIAGNOSIS — I10 Essential (primary) hypertension: Secondary | ICD-10-CM | POA: Diagnosis not present

## 2022-01-03 MED ORDER — OLMESARTAN-AMLODIPINE-HCTZ 40-10-25 MG PO TABS: 1.0000 | ORAL_TABLET | Freq: Every day | ORAL | 2 refills | Status: DC
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
## 2022-01-03 NOTE — Patient Instructions (Signed)
It was a pleasure to see you today.  Thank you for giving [place] the opportunity to be involved in your care.  Below is a brief recap of your visit and next steps.  We will plan to see you again in 2-4 weeks.  Summary  Blood Pressure Medications: Metoprolol tartrate (lopressor): 25 mg twice daily Combo Pill (olmesartan-amlodipine-HCTZ 40-10-25 mg) daily  STOP Amlodipine 10 mg daily Olmesartan 40 mg daily

## 2022-01-03 NOTE — Assessment & Plan Note (Addendum)
His blood pressure today was 150/83 initially and improved to 138/70 on repeat.  He has been checking his blood pressure at home and keeping a log.  Systolic readings are consistently 140-150s mmHg.  He is currently taking amlodipine 10 mg daily, Toprol tartrate 25 mg twice daily, and olmesartan 40 mg daily as prescribed. -Start combo pill olmesartan-amlodipine-HCTZ 40 - 10 - 25 mg daily -Discontinue amlodipine 10 mg daily and olmesartan 40 mg daily -Continue metoprolol tartrate at current dose -Follow-up in 2 weeks for BP check.  I have asked him to continue keeping a BP log in the interim.

## 2022-01-20 ENCOUNTER — Ambulatory Visit (INDEPENDENT_AMBULATORY_CARE_PROVIDER_SITE_OTHER): Payer: Medicare Other | Admitting: Internal Medicine

## 2022-01-20 ENCOUNTER — Encounter: Payer: Self-pay | Admitting: Internal Medicine

## 2022-01-20 VITALS — BP 146/78 | HR 87 | Ht 73.0 in | Wt 260.6 lb

## 2022-01-20 DIAGNOSIS — I10 Essential (primary) hypertension: Secondary | ICD-10-CM

## 2022-01-20 MED ORDER — CHLORTHALIDONE 25 MG PO TABS: 25.0000 mg | ORAL_TABLET | Freq: Every day | ORAL | 2 refills | Status: DC

## 2022-01-20 MED ORDER — OLMESARTAN MEDOXOMIL 40 MG PO TABS: 40.0000 mg | ORAL_TABLET | Freq: Every day | ORAL | 1 refills | Status: DC

## 2022-01-20 MED ORDER — AMLODIPINE BESYLATE 10 MG PO TABS: 10.0000 mg | ORAL_TABLET | Freq: Every day | ORAL | 1 refills | Status: DC
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
## 2022-01-20 NOTE — Patient Instructions (Signed)
It was a pleasure to see you today.  Thank you for giving [place] the opportunity to be involved in your care.  Below is a brief recap of your visit and next steps.  We will plan to see you again in 1 months.  Summary Start chlorthalidone 25 mg Continue amlodipine and olmesartan Follow up in 1 month

## 2022-01-21 NOTE — Assessment & Plan Note (Addendum)
Olmesartan-amlodipine-HCTZ 40-10-25 mg combination pill was prescribed at his last appointment, however he was unable to fill the prescription due to expense. He has continued to take amlodipine 10 mg daily and olmesartan 40 mg daily as previously prescribed. His blood pressure today is 146/78. -Add chlorthalidone 25 mg daily -Continue olmesartan and amlodipine at current doses -He has previously scheduled follow up in 2 weeks

## 2022-02-03 ENCOUNTER — Encounter: Payer: Medicare Other | Admitting: Internal Medicine

## 2022-02-25 ENCOUNTER — Encounter: Payer: Self-pay | Admitting: Internal Medicine

## 2022-02-25 ENCOUNTER — Ambulatory Visit (INDEPENDENT_AMBULATORY_CARE_PROVIDER_SITE_OTHER): Payer: Medicare Other | Admitting: Internal Medicine

## 2022-02-25 VITALS — BP 114/72 | HR 70 | Ht 73.0 in | Wt 255.6 lb

## 2022-02-25 DIAGNOSIS — Z0000 Encounter for general adult medical examination without abnormal findings: Secondary | ICD-10-CM
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
## 2022-02-25 NOTE — Patient Instructions (Signed)
Health Maintenance, Male Adopting a healthy lifestyle and getting preventive care are important in promoting health and wellness. Ask your health care provider about: The right schedule for you to have regular tests and exams. Things you can do on your own to prevent diseases and keep yourself healthy. What should I know about diet, weight, and exercise? Eat a healthy diet  Eat a diet that includes plenty of vegetables, fruits, low-fat dairy products, and lean protein. Do not eat a lot of foods that are high in solid fats, added sugars, or sodium. Maintain a healthy weight Body mass index (BMI) is a measurement that can be used to identify possible weight problems. It estimates body fat based on height and weight. Your health care provider can help determine your BMI and help you achieve or maintain a healthy weight. Get regular exercise Get regular exercise. This is one of the most important things you can do for your health. Most adults should: Exercise for at least 150 minutes each week. The exercise should increase your heart rate and make you sweat (moderate-intensity exercise). Do strengthening exercises at least twice a week. This is in addition to the moderate-intensity exercise. Spend less time sitting. Even light physical activity can be beneficial. Watch cholesterol and blood lipids Have your blood tested for lipids and cholesterol at 66 years of age, then have this test every 5 years. You may need to have your cholesterol levels checked more often if: Your lipid or cholesterol levels are high. You are older than 66 years of age. You are at high risk for heart disease. What should I know about cancer screening? Many types of cancers can be detected early and may often be prevented. Depending on your health history and family history, you may need to have cancer screening at various ages. This may include screening for: Colorectal cancer. Prostate cancer. Skin cancer. Lung  cancer. What should I know about heart disease, diabetes, and high blood pressure? Blood pressure and heart disease High blood pressure causes heart disease and increases the risk of stroke. This is more likely to develop in people who have high blood pressure readings or are overweight. Talk with your health care provider about your target blood pressure readings. Have your blood pressure checked: Every 3-5 years if you are 18-39 years of age. Every year if you are 40 years old or older. If you are between the ages of 65 and 75 and are a current or former smoker, ask your health care provider if you should have a one-time screening for abdominal aortic aneurysm (AAA). Diabetes Have regular diabetes screenings. This checks your fasting blood sugar level. Have the screening done: Once every three years after age 45 if you are at a normal weight and have a low risk for diabetes. More often and at a younger age if you are overweight or have a high risk for diabetes. What should I know about preventing infection? Hepatitis B If you have a higher risk for hepatitis B, you should be screened for this virus. Talk with your health care provider to find out if you are at risk for hepatitis B infection. Hepatitis C Blood testing is recommended for: Everyone born from 1945 through 1965. Anyone with known risk factors for hepatitis C. Sexually transmitted infections (STIs) You should be screened each year for STIs, including gonorrhea and chlamydia, if: You are sexually active and are younger than 66 years of age. You are older than 66 years of age and your   health care provider tells you that you are at risk for this type of infection. Your sexual activity has changed since you were last screened, and you are at increased risk for chlamydia or gonorrhea. Ask your health care provider if you are at risk. Ask your health care provider about whether you are at high risk for HIV. Your health care provider  may recommend a prescription medicine to help prevent HIV infection. If you choose to take medicine to prevent HIV, you should first get tested for HIV. You should then be tested every 3 months for as long as you are taking the medicine. Follow these instructions at home: Alcohol use Do not drink alcohol if your health care provider tells you not to drink. If you drink alcohol: Limit how much you have to 0-2 drinks a day. Know how much alcohol is in your drink. In the [place] one drink equals one 12 oz bottle of beer (355 mL), one 5 oz glass of wine (148 mL), or one 1 oz glass of hard liquor (44 mL). Lifestyle Do not use any products that contain nicotine or tobacco. These products include cigarettes, chewing tobacco, and vaping devices, such as e-cigarettes. If you need help quitting, ask your health care provider. Do not use street drugs. Do not share needles. Ask your health care provider for help if you need support or information about quitting drugs. General instructions Schedule regular health, dental, and eye exams. Stay current with your vaccines. Tell your health care provider if: You often feel depressed. You have ever been abused or do not feel safe at home. Summary Adopting a healthy lifestyle and getting preventive care are important in promoting health and wellness. Follow your health care provider's instructions about healthy diet, exercising, and getting tested or screened for diseases. Follow your health care provider's instructions on monitoring your cholesterol and blood pressure. This information is not intended to replace advice given to you by your health care provider. Make sure you discuss any questions you have with your health care provider. Document Revised: 06/25/2020 Document Reviewed: 06/25/2020 Elsevier Patient Education  2023 Elsevier Inc.  

## 2022-04-05 ENCOUNTER — Other Ambulatory Visit: Payer: Self-pay | Admitting: Nurse Practitioner

## 2022-04-05 DIAGNOSIS — E119 Type 2 diabetes mellitus without complications: Secondary | ICD-10-CM

## 2022-04-10 ENCOUNTER — Telehealth: Payer: Self-pay | Admitting: Internal Medicine

## 2022-04-10 DIAGNOSIS — E119 Type 2 diabetes mellitus without complications: Secondary | ICD-10-CM

## 2022-04-10 MED ORDER — EMPAGLIFLOZIN 25 MG PO TABS: 25.0000 mg | ORAL_TABLET | Freq: Every day | ORAL | 3 refills | Status: DC

## 2022-04-10 NOTE — Telephone Encounter (Signed)
Prescription Request  04/10/2022  Is this a "Controlled Substance" medicine? Yes  LOV: 02/25/2022  What is the name of the medication or equipment? empagliflozin (JARDIANCE) 25 MG TABS tablet   Have you contacted your pharmacy to request a refill? Yes   Which pharmacy would you like this sent to?  [id number] [place] [place] [facility] [address] [place]Phone: [phone]: [phone]   Patient notified that their request is being sent to the clinical staff for review and that they should receive a response within 2 business days.   Please advise at [phone]

## 2022-04-17 ENCOUNTER — Encounter: Payer: Self-pay | Admitting: Radiology

## 2022-05-09 ENCOUNTER — Other Ambulatory Visit: Payer: Self-pay

## 2022-05-09 ENCOUNTER — Telehealth: Payer: Self-pay | Admitting: Internal Medicine

## 2022-05-09 DIAGNOSIS — I10 Essential (primary) hypertension: Secondary | ICD-10-CM

## 2022-05-09 MED ORDER — HYDROCHLOROTHIAZIDE 12.5 MG PO TABS: 12.5000 mg | ORAL_TABLET | Freq: Every day | ORAL | 3 refills | Status: DC

## 2022-05-09 NOTE — Telephone Encounter (Signed)
Spoke with patient and send medication over.

## 2022-05-09 NOTE — Telephone Encounter (Signed)
Patient called had a mix up in medicines with his pharmacy and needs it corrected.  Blood pressure medication like a water pill  Patient asked for nurse to return his [phone].

## 2022-05-29 ENCOUNTER — Ambulatory Visit (INDEPENDENT_AMBULATORY_CARE_PROVIDER_SITE_OTHER): Payer: Medicare Other | Admitting: Internal Medicine

## 2022-05-29 ENCOUNTER — Encounter: Payer: Self-pay | Admitting: Internal Medicine

## 2022-05-29 VITALS — BP 137/87 | HR 70 | Resp 16 | Ht 72.0 in | Wt 257.0 lb

## 2022-05-29 DIAGNOSIS — E1169 Type 2 diabetes mellitus with other specified complication: Secondary | ICD-10-CM | POA: Diagnosis not present

## 2022-05-29 DIAGNOSIS — E039 Hypothyroidism, unspecified: Secondary | ICD-10-CM

## 2022-05-29 DIAGNOSIS — M7021 Olecranon bursitis, right elbow: Secondary | ICD-10-CM | POA: Insufficient documentation

## 2022-05-29 DIAGNOSIS — K219 Gastro-esophageal reflux disease without esophagitis: Secondary | ICD-10-CM | POA: Diagnosis not present

## 2022-05-29 DIAGNOSIS — I4819 Other persistent atrial fibrillation: Secondary | ICD-10-CM

## 2022-05-29 DIAGNOSIS — I10 Essential (primary) hypertension: Secondary | ICD-10-CM

## 2022-05-29 LAB — POCT GLYCOSYLATED HEMOGLOBIN (HGB A1C): HbA1c, POC (controlled diabetic range): 8.2 % — AB (ref 0.0–7.0)

## 2022-05-29 MED ORDER — METFORMIN HCL ER 500 MG PO TB24: 1000.0000 mg | ORAL_TABLET | Freq: Two times a day (BID) | ORAL | 2 refills | Status: DC

## 2022-05-29 MED ORDER — PANTOPRAZOLE SODIUM 40 MG PO TBEC: 40.0000 mg | DELAYED_RELEASE_TABLET | Freq: Every day | ORAL | 2 refills | Status: DC

## 2022-05-29 NOTE — Assessment & Plan Note (Signed)
He is currently prescribed levothyroxine 112 mcg daily.  TSH WNL when last updated. -No medication changes today

## 2022-05-29 NOTE — Assessment & Plan Note (Signed)
Regular rate and rhythm on examination today.  He is currently prescribed Eliquis and metoprolol. -No medication changes today

## 2022-05-29 NOTE — Patient Instructions (Signed)
It was a pleasure to see you today.  Thank you for giving [place] the opportunity to be involved in your care.  Below is a brief recap of your visit and next steps.  We will plan to see you again in 6 months.  Summary Switch to metformin XR - take 500 mg twice daily x 1 week, then increase 2 in the morning 1 in the evening x 1 week, then increase to 2 tablets twice daily  Try Protonix for heartburn relief I recommend following up with orthopedic surgery regarding your elbow Follow up in 6 months

## 2022-05-29 NOTE — Assessment & Plan Note (Signed)
BP 137/87 today.  He is currently prescribed amlodipine 10 mg daily, HCTZ 12.5 mg daily, metoprolol 25 mg twice daily, and olmesartan 40 mg daily. -No medication changes today

## 2022-05-29 NOTE — Assessment & Plan Note (Signed)
POC A1c today has increased to 8.2 from 7.2 previously.  He is currently prescribed metformin 1000 mg twice daily, however he reports that he has only been taking 500 mg twice daily due to GI side effects. -Switch to metformin XR today and gradually increase dose to 1000 mg twice daily -Discussed a GLP-1 therapy, however he would like to defer this for now.  Of note, he also has a history of thyroid cancer but cannot recall the exact type. Will further investigate and clarify this.

## 2022-05-29 NOTE — Assessment & Plan Note (Signed)
He reports swelling in his right elbow the olecranon bursa.  This is nonerythematous and nontender to palpation.  Fluid has previously been aspirated on 2 occasions by orthopedic surgery. -Recommend follow-up with orthopedic surgery for further management

## 2022-05-29 NOTE — Assessment & Plan Note (Signed)
He endorses recent symptoms of reflux developing after meals. -Trial Protonix 40 mg daily
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
## 2022-06-02 ENCOUNTER — Other Ambulatory Visit: Payer: Self-pay | Admitting: Internal Medicine

## 2022-06-02 DIAGNOSIS — E1169 Type 2 diabetes mellitus with other specified complication: Secondary | ICD-10-CM

## 2022-06-06 ENCOUNTER — Encounter: Payer: Self-pay | Admitting: Family Medicine

## 2022-06-06 ENCOUNTER — Ambulatory Visit (INDEPENDENT_AMBULATORY_CARE_PROVIDER_SITE_OTHER): Payer: Medicare Other | Admitting: Family Medicine

## 2022-06-06 VITALS — BP 128/83 | HR 84 | Ht 72.0 in | Wt 252.1 lb

## 2022-06-06 DIAGNOSIS — R079 Chest pain, unspecified: Secondary | ICD-10-CM | POA: Diagnosis not present

## 2022-06-06 MED ORDER — NITROGLYCERIN 0.4 MG SL SUBL: 0.4000 mg | SUBLINGUAL_TABLET | SUBLINGUAL | 3 refills | Status: AC | PRN

## 2022-06-06 NOTE — Patient Instructions (Signed)
It was pleasure meeting with you today. Please take medications as prescribed. Follow up with your primary health provider if any health concerns arises. If symptoms worsen please contact your primary care provider and/or visit the emergency department.  

## 2022-06-06 NOTE — Assessment & Plan Note (Addendum)
Vitals:   06/06/22 1529  BP: 128/83  Patient Symptoms subsided Heartburn vs Angina Start Nitroglycerin 0.4 mg SL PRN , Advise Asprin 81 mg. Patient reported did not start Protonix 40 mg and will start taking medication today. Discussed medication compliance Explained to patient lifestyle modifications,avoiding caffeine, alcohol and trigger foods, limit sugar and fatty food intake If symptoms persist or worsen visit ED
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
## 2022-06-20 ENCOUNTER — Other Ambulatory Visit: Payer: Self-pay | Admitting: Nurse Practitioner

## 2022-06-20 DIAGNOSIS — E038 Other specified hypothyroidism: Secondary | ICD-10-CM

## 2022-09-17 ENCOUNTER — Other Ambulatory Visit: Payer: Self-pay | Admitting: Internal Medicine

## 2022-09-17 DIAGNOSIS — I10 Essential (primary) hypertension: Secondary | ICD-10-CM

## 2022-09-26 ENCOUNTER — Other Ambulatory Visit: Payer: Self-pay | Admitting: Nurse Practitioner

## 2022-10-15 ENCOUNTER — Other Ambulatory Visit: Payer: Self-pay | Admitting: Internal Medicine

## 2022-10-15 DIAGNOSIS — I10 Essential (primary) hypertension: Secondary | ICD-10-CM

## 2022-10-16 ENCOUNTER — Other Ambulatory Visit: Payer: Self-pay | Admitting: Internal Medicine

## 2022-10-16 DIAGNOSIS — F419 Anxiety disorder, unspecified: Secondary | ICD-10-CM

## 2022-10-23 LAB — HM DIABETES EYE EXAM

## 2022-11-28 ENCOUNTER — Ambulatory Visit: Payer: Medicare Other | Admitting: Internal Medicine

## 2022-12-01 ENCOUNTER — Ambulatory Visit: Payer: Medicare Other | Admitting: Internal Medicine

## 2022-12-01 ENCOUNTER — Encounter: Payer: Self-pay | Admitting: Internal Medicine

## 2022-12-01 VITALS — BP 119/77 | HR 68 | Ht 72.0 in | Wt 241.0 lb

## 2022-12-01 DIAGNOSIS — E782 Mixed hyperlipidemia: Secondary | ICD-10-CM

## 2022-12-01 DIAGNOSIS — Z23 Encounter for immunization: Secondary | ICD-10-CM

## 2022-12-01 DIAGNOSIS — E1169 Type 2 diabetes mellitus with other specified complication: Secondary | ICD-10-CM

## 2022-12-01 DIAGNOSIS — I10 Essential (primary) hypertension: Secondary | ICD-10-CM | POA: Diagnosis not present

## 2022-12-01 DIAGNOSIS — E039 Hypothyroidism, unspecified: Secondary | ICD-10-CM

## 2022-12-01 DIAGNOSIS — K219 Gastro-esophageal reflux disease without esophagitis: Secondary | ICD-10-CM | POA: Diagnosis not present

## 2022-12-01 DIAGNOSIS — I4819 Other persistent atrial fibrillation: Secondary | ICD-10-CM

## 2022-12-01 DIAGNOSIS — R1310 Dysphagia, unspecified: Secondary | ICD-10-CM

## 2022-12-01 DIAGNOSIS — Z7984 Long term (current) use of oral hypoglycemic drugs: Secondary | ICD-10-CM

## 2022-12-01 DIAGNOSIS — Z6833 Body mass index (BMI) 33.0-33.9, adult: Secondary | ICD-10-CM

## 2022-12-01 NOTE — Patient Instructions (Signed)
It was a pleasure to see you today.  Thank you for giving [place] the opportunity to be involved in your care.  Below is a brief recap of your visit and next steps.  We will plan to see you again in 6 months.  Summary No medication changes today  Repeat labs ordered Flu shot today GI referral placed for difficulty swallowing Follow up in 6 months
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
## 2022-12-02 ENCOUNTER — Encounter (INDEPENDENT_AMBULATORY_CARE_PROVIDER_SITE_OTHER): Payer: Self-pay | Admitting: *Deleted

## 2022-12-03 LAB — CBC WITH DIFFERENTIAL/PLATELET
Basophils Absolute: 0 10*3/uL (ref 0.0–0.2)
Basos: 1 %
EOS (ABSOLUTE): 0.1 10*3/uL (ref 0.0–0.4)
Eos: 1 %
Hematocrit: 46 % (ref 37.5–51.0)
Hemoglobin: 14.9 g/dL (ref 13.0–17.7)
Immature Grans (Abs): 0 10*3/uL (ref 0.0–0.1)
Immature Granulocytes: 0 %
Lymphocytes Absolute: 1.6 10*3/uL (ref 0.7–3.1)
Lymphs: 20 %
MCH: 28.2 pg (ref 26.6–33.0)
MCHC: 32.4 g/dL (ref 31.5–35.7)
MCV: 87 fL (ref 79–97)
Monocytes Absolute: 0.6 10*3/uL (ref 0.1–0.9)
Monocytes: 8 %
Neutrophils Absolute: 5.8 10*3/uL (ref 1.4–7.0)
Neutrophils: 70 %
Platelets: 273 10*3/uL (ref 150–450)
RBC: 5.28 x10E6/uL (ref 4.14–5.80)
RDW: 11.7 % (ref 11.6–15.4)
WBC: 8.2 10*3/uL (ref 3.4–10.8)

## 2022-12-03 LAB — CMP14+EGFR
ALT: 26 [IU]/L (ref 0–44)
AST: 14 [IU]/L (ref 0–40)
Albumin: 4.4 g/dL (ref 3.9–4.9)
Alkaline Phosphatase: 94 [IU]/L (ref 44–121)
BUN/Creatinine Ratio: 20 (ref 10–24)
BUN: 17 mg/dL (ref 8–27)
Bilirubin Total: 0.3 mg/dL (ref 0.0–1.2)
CO2: 27 mmol/L (ref 20–29)
Calcium: 10.1 mg/dL (ref 8.6–10.2)
Chloride: 99 mmol/L (ref 96–106)
Creatinine, Ser: 0.87 mg/dL (ref 0.76–1.27)
Globulin, Total: 2.4 g/dL (ref 1.5–4.5)
Glucose: 134 mg/dL — ABNORMAL HIGH (ref 70–99)
Potassium: 3.9 mmol/L (ref 3.5–5.2)
Sodium: 140 mmol/L (ref 134–144)
Total Protein: 6.8 g/dL (ref 6.0–8.5)
eGFR: 95 mL/min/{1.73_m2} (ref >59)

## 2022-12-03 LAB — B12 AND FOLATE PANEL
Folate: 11.6 ng/mL (ref >3.0)
Vitamin B-12: 498 pg/mL (ref 232–1245)

## 2022-12-03 LAB — LIPID PANEL
Chol/HDL Ratio: 2.7 {ratio} (ref 0.0–5.0)
Cholesterol, Total: 139 mg/dL (ref 100–199)
HDL: 52 mg/dL (ref >39)
LDL Chol Calc (NIH): 64 mg/dL (ref 0–99)
Triglycerides: 132 mg/dL (ref 0–149)
VLDL Cholesterol Cal: 23 mg/dL (ref 5–40)

## 2022-12-03 LAB — MICROALBUMIN / CREATININE URINE RATIO: Creatinine, Urine: 42 mg/dL

## 2022-12-03 LAB — IRON,TIBC AND FERRITIN PANEL
Ferritin: 176 ng/mL (ref 30–400)
Iron Saturation: 24 % (ref 15–55)
Iron: 88 ug/dL (ref 38–169)
Total Iron Binding Capacity: 372 ug/dL (ref 250–450)
UIBC: 284 ug/dL (ref 111–343)

## 2022-12-03 LAB — TSH+FREE T4
Free T4: 1.74 ng/dL (ref 0.82–1.77)
TSH: 1.28 u[IU]/mL (ref 0.450–4.500)

## 2022-12-03 LAB — HEMOGLOBIN A1C
Est. average glucose Bld gHb Est-mCnc: 203 mg/dL
Hgb A1c MFr Bld: 8.7 % — ABNORMAL HIGH (ref 4.8–5.6)

## 2022-12-08 ENCOUNTER — Ambulatory Visit (INDEPENDENT_AMBULATORY_CARE_PROVIDER_SITE_OTHER): Payer: Medicare Other | Admitting: Gastroenterology

## 2022-12-08 ENCOUNTER — Encounter (INDEPENDENT_AMBULATORY_CARE_PROVIDER_SITE_OTHER): Payer: Self-pay | Admitting: Gastroenterology

## 2022-12-08 VITALS — BP 124/83 | HR 72 | Temp 97.8°F | Ht 72.0 in | Wt 241.0 lb

## 2022-12-08 DIAGNOSIS — Z1211 Encounter for screening for malignant neoplasm of colon: Secondary | ICD-10-CM | POA: Insufficient documentation

## 2022-12-08 DIAGNOSIS — R1319 Other dysphagia: Secondary | ICD-10-CM | POA: Insufficient documentation

## 2022-12-08 DIAGNOSIS — Z860100 Personal history of colon polyps, unspecified: Secondary | ICD-10-CM

## 2022-12-08 DIAGNOSIS — M542 Cervicalgia: Secondary | ICD-10-CM | POA: Insufficient documentation

## 2022-12-08 DIAGNOSIS — K760 Fatty (change of) liver, not elsewhere classified: Secondary | ICD-10-CM | POA: Diagnosis not present

## 2022-12-08 DIAGNOSIS — Z6832 Body mass index (BMI) 32.0-32.9, adult: Secondary | ICD-10-CM | POA: Insufficient documentation

## 2022-12-08 DIAGNOSIS — Z01810 Encounter for preprocedural cardiovascular examination: Secondary | ICD-10-CM | POA: Insufficient documentation

## 2022-12-08 DIAGNOSIS — I4819 Other persistent atrial fibrillation: Secondary | ICD-10-CM

## 2022-12-08 DIAGNOSIS — K219 Gastro-esophageal reflux disease without esophagitis: Secondary | ICD-10-CM

## 2022-12-08 DIAGNOSIS — R1310 Dysphagia, unspecified: Secondary | ICD-10-CM

## 2022-12-08 NOTE — Patient Instructions (Signed)
It was very nice to meet you today, as dicussed with will plan for the following :  1) Upper endoscopy and Colonoscopy 2) Ultrasound carotid artery
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
## 2022-12-09 ENCOUNTER — Telehealth (INDEPENDENT_AMBULATORY_CARE_PROVIDER_SITE_OTHER): Payer: Self-pay | Admitting: Gastroenterology

## 2022-12-09 NOTE — Telephone Encounter (Signed)
12/09/22  Samuel Hubbard 1955-06-20  What type of surgery is being performed? EGD/Colonoscopy  When is surgery scheduled? TBD  Clearance to hold Eliquis 2 days prior  Name of physician performing surgery?  Dr. Starleen Arms Gastroenterology at [facility] Phone: [phone] Fax: [phone]  Anethesia type (none, local, MAC, general)? MAC

## 2022-12-10 ENCOUNTER — Telehealth (INDEPENDENT_AMBULATORY_CARE_PROVIDER_SITE_OTHER): Payer: Self-pay | Admitting: Gastroenterology

## 2022-12-10 MED ORDER — PEG 3350-KCL-NA BICARB-NACL 420 G PO SOLR
4000.0000 mL | Freq: Once | ORAL | 0 refills | Status: AC
Start: 2022-12-10 — End: 2022-12-10

## 2022-12-10 NOTE — Telephone Encounter (Signed)
Left message for to return call. [place] scheduled for 12/17/22 at 3:30pm. Need to schedule EGD/TCS

## 2022-12-10 NOTE — Telephone Encounter (Signed)
Pt returned call and scheduled for 01/01/23. Prep sent to pharmacy. Instructions will be mailed once pre op is received.

## 2022-12-10 NOTE — Addendum Note (Signed)
Addended by: [name] on: 12/10/2022 01:56 PM   Modules accepted: Orders

## 2022-12-17 ENCOUNTER — Ambulatory Visit (HOSPITAL_COMMUNITY)
Admission: RE | Admit: 2022-12-17 | Discharge: 2022-12-17 | Disposition: A | Discharge status: Home / Self Care | Payer: Medicare Other | Source: Ambulatory Visit | Attending: Gastroenterology | Admitting: Gastroenterology

## 2022-12-17 DIAGNOSIS — M542 Cervicalgia: Secondary | ICD-10-CM | POA: Diagnosis present

## 2022-12-17 DIAGNOSIS — I4819 Other persistent atrial fibrillation: Secondary | ICD-10-CM | POA: Insufficient documentation

## 2022-12-22 ENCOUNTER — Encounter (INDEPENDENT_AMBULATORY_CARE_PROVIDER_SITE_OTHER): Payer: Self-pay | Admitting: *Deleted

## 2022-12-22 NOTE — Progress Notes (Signed)
Hi Wendy ,  Can you please call the patient and tell the patient the ultrasound of the carotid artery did not show any significant stenosis (narrowing).  This is a good news!  Thanks,  Vista Lawman, MD Gastroenterology and Hepatology [facility] Gastroenterology

## 2022-12-25 NOTE — Patient Instructions (Signed)
Samuel Hubbard  12/25/2022     @PREFPERIOPPHARMACY @   Your procedure is scheduled on  01/01/2023.   Report to [facility] at  1000  A.M.   Call this number if you have problems the morning of surgery:  [phone]  If you experience any cold or flu symptoms such as cough, fever, chills, shortness of breath, etc. between now and your scheduled surgery, please notify us at the above number.   Remember:     Your last dose of jardiance should be on 12/28/2022.    Your last dose of eliquis should be on 12/29/2022.  Take 1/2 of your usual metformin the night before your procedure.     DO NOT take any medications for diabetes the morning of your procedure.    Follow the diet and prep instructions given to you by the office.   You may drink clear liquids until 0800 am on 01/01/2023.     Clear liquids allowed are:                    Water, Juice (No red color; non-citric and without pulp; diabetics please choose diet or no sugar options), Carbonated beverages (diabetics please choose diet or no sugar options), Clear Tea (No creamer, milk, or cream, including half & half and powdered creamer), Black Coffee Only (No creamer, milk or cream, including half & half and powdered creamer), and Clear Sports drink (No red color; diabetics please choose diet or no sugar options)     Take these medicines the morning of surgery with A SIP OF WATER     amlodipine, citalopram, flexeril, levothyroxine, metoprolol.     Do not wear jewelry, make-up or nail polish, including gel polish,  artificial nails, or any other type of covering on natural nails (fingers and  toes).  Do not wear lotions, powders, or perfumes, or deodorant.  Do not shave 48 hours prior to surgery.  Men may shave face and neck.  Do not bring valuables to the hospital.  [facility] is not responsible for any belongings or valuables.  Contacts, dentures or bridgework may not be worn into surgery.  Leave your  suitcase in the car.  After surgery it may be brought to your room.  For patients admitted to the hospital, discharge time will be determined by your treatment team.  Patients discharged the day of surgery will not be allowed to drive home and must have someone with them for 24 hours.    Special instructions:   DO NOT smoke tobacco or vape for 24 hours before your procedure.  Please read over the following fact sheets that you were given. Anesthesia Post-op Instructions and Care and Recovery After Surgery      Upper Endoscopy, Adult, Care After After the procedure, it is common to have a sore throat. It is also common to have: Mild stomach pain or discomfort. Bloating. Nausea. Follow these instructions at home: The instructions below may help you care for yourself at home. Your health care provider may give you more instructions. If you have questions, ask your health care provider. If you were given a sedative during the procedure, it can affect you for several hours. Do not drive or operate machinery until your health care provider says that it is safe. If you will be going home right after the procedure, plan to have a responsible adult: Take you home from the hospital or clinic. You will not be  allowed to drive. Care for you for the time you are told. Follow instructions from your health care provider about what you may eat and drink. Return to your normal activities as told by your health care provider. Ask your health care provider what activities are safe for you. Take over-the-counter and prescription medicines only as told by your health care provider. Contact a health care provider if you: Have a sore throat that lasts longer than one day. Have trouble swallowing. Have a fever. Get help right away if you: Vomit blood or your vomit looks like coffee grounds. Have bloody, black, or tarry stools. Have a very bad sore throat or you cannot swallow. Have difficulty breathing  or very bad pain in your chest or abdomen. These symptoms may be an emergency. Get help right away. Call 911. Do not wait to see if the symptoms will go away. Do not drive yourself to the hospital. Summary After the procedure, it is common to have a sore throat, mild stomach discomfort, bloating, and nausea. If you were given a sedative during the procedure, it can affect you for several hours. Do not drive until your health care provider says that it is safe. Follow instructions from your health care provider about what you may eat and drink. Return to your normal activities as told by your health care provider. This information is not intended to replace advice given to you by your health care provider. Make sure you discuss any questions you have with your health care provider. Document Revised: 05/15/2021 Document Reviewed: 05/15/2021 Elsevier Patient Education  2024 Elsevier Inc. Colonoscopy, Adult, Care After The following information offers guidance on how to care for yourself after your procedure. Your health care provider may also give you more specific instructions. If you have problems or questions, contact your health care provider. What can I expect after the procedure? After the procedure, it is common to have: A small amount of blood in your stool for 24 hours after the procedure. Some gas. Mild cramping or bloating of your abdomen. Follow these instructions at home: Eating and drinking  Drink enough fluid to keep your urine pale yellow. Follow instructions from your health care provider about eating or drinking restrictions. Resume your normal diet as told by your health care provider. Avoid heavy or fried foods that are hard to digest. Activity Rest as told by your health care provider. Avoid sitting for a long time without moving. Get up to take short walks every 1-2 hours. This is important to improve blood flow and breathing. Ask for help if you feel weak or  unsteady. Return to your normal activities as told by your health care provider. Ask your health care provider what activities are safe for you. Managing cramping and bloating  Try walking around when you have cramps or feel bloated. If directed, apply heat to your abdomen as told by your health care provider. Use the heat source that your health care provider recommends, such as a moist heat pack or a heating pad. Place a towel between your skin and the heat source. Leave the heat on for 20-30 minutes. Remove the heat if your skin turns bright red. This is especially important if you are unable to feel pain, heat, or cold. You have a greater risk of getting burned. General instructions If you were given a sedative during the procedure, it can affect you for several hours. Do not drive or operate machinery until your health care provider says that it  is safe. For the first 24 hours after the procedure: Do not sign important documents. Do not drink alcohol. Do your regular daily activities at a slower pace than normal. Eat soft foods that are easy to digest. Take over-the-counter and prescription medicines only as told by your health care provider. Keep all follow-up visits. This is important. Contact a health care provider if: You have blood in your stool 2-3 days after the procedure. Get help right away if: You have more than a small spotting of blood in your stool. You have large blood clots in your stool. You have swelling of your abdomen. You have nausea or vomiting. You have a fever. You have increasing pain in your abdomen that is not relieved with medicine. These symptoms may be an emergency. Get help right away. Call 911. Do not wait to see if the symptoms will go away. Do not drive yourself to the hospital. Summary After the procedure, it is common to have a small amount of blood in your stool. You may also have mild cramping and bloating of your abdomen. If you were given a  sedative during the procedure, it can affect you for several hours. Do not drive or operate machinery until your health care provider says that it is safe. Get help right away if you have a lot of blood in your stool, nausea or vomiting, a fever, or increased pain in your abdomen. This information is not intended to replace advice given to you by your health care provider. Make sure you discuss any questions you have with your health care provider. Document Revised: 03/18/2022 Document Reviewed: 09/26/2020 Elsevier Patient Education  2024 Elsevier Inc. Monitored Anesthesia Care, Care After The following information offers guidance on how to care for yourself after your procedure. Your health care provider may also give you more specific instructions. If you have problems or questions, contact your health care provider. What can I expect after the procedure? After the procedure, it is common to have: Tiredness. Little or no memory about what happened during or after the procedure. Impaired judgment when it comes to making decisions. Nausea or vomiting. Some trouble with balance. Follow these instructions at home: For the time period you were told by your health care provider:  Rest. Do not participate in activities where you could fall or become injured. Do not drive or use machinery. Do not drink alcohol. Do not take sleeping pills or medicines that cause drowsiness. Do not make important decisions or sign legal documents. Do not take care of children on your own. Medicines Take over-the-counter and prescription medicines only as told by your health care provider. If you were prescribed antibiotics, take them as told by your health care provider. Do not stop using the antibiotic even if you start to feel better. Eating and drinking Follow instructions from your health care provider about what you may eat and drink. Drink enough fluid to keep your urine pale yellow. If you vomit: Drink  clear fluids slowly and in small amounts as you are able. Clear fluids include water, ice chips, low-calorie sports drinks, and fruit juice that has water added to it (diluted fruit juice). Eat light and bland foods in small amounts as you are able. These foods include bananas, applesauce, rice, lean meats, toast, and crackers. General instructions  Have a responsible adult stay with you for the time you are told. It is important to have someone help care for you until you are awake and alert. If you  have sleep apnea, surgery and some medicines can increase your risk for breathing problems. Follow instructions from your health care provider about wearing your sleep device: When you are sleeping. This includes during daytime naps. While taking prescription pain medicines, sleeping medicines, or medicines that make you drowsy. Do not use any products that contain nicotine or tobacco. These products include cigarettes, chewing tobacco, and vaping devices, such as e-cigarettes. If you need help quitting, ask your health care provider. Contact a health care provider if: You feel nauseous or vomit every time you eat or drink. You feel light-headed. You are still sleepy or having trouble with balance after 24 hours. You get a rash. You have a fever. You have redness or swelling around the IV site. Get help right away if: You have trouble breathing. You have new confusion after you get home. These symptoms may be an emergency. Get help right away. Call 911. Do not wait to see if the symptoms will go away. Do not drive yourself to the hospital. This information is not intended to replace advice given to you by your health care provider. Make sure you discuss any questions you have with your health care provider. Document Revised: 07/01/2021 Document Reviewed: 07/01/2021 Elsevier Patient Education  2024 [employer].

## 2022-12-26 ENCOUNTER — Encounter (HOSPITAL_COMMUNITY): Payer: Self-pay

## 2022-12-26 ENCOUNTER — Encounter (HOSPITAL_COMMUNITY)
Admission: RE | Admit: 2022-12-26 | Discharge: 2022-12-26 | Disposition: A | Discharge status: Home / Self Care | Payer: Medicare Other | Source: Ambulatory Visit | Attending: Gastroenterology | Admitting: Gastroenterology

## 2022-12-26 VITALS — BP 124/83 | HR 72 | Temp 97.8°F | Resp 18 | Ht 72.0 in | Wt 241.0 lb

## 2022-12-26 DIAGNOSIS — I10 Essential (primary) hypertension: Secondary | ICD-10-CM

## 2022-12-26 DIAGNOSIS — E1169 Type 2 diabetes mellitus with other specified complication: Secondary | ICD-10-CM

## 2022-12-26 HISTORY — DX: Unspecified atrial fibrillation: I48.91

## 2022-12-26 HISTORY — DX: Cardiac arrhythmia, unspecified: I49.9

## 2023-01-01 ENCOUNTER — Encounter (HOSPITAL_COMMUNITY)
Admission: RE | Disposition: A | Discharge status: Home / Self Care | Payer: Self-pay | Source: Home / Self Care | Attending: Gastroenterology

## 2023-01-01 ENCOUNTER — Ambulatory Visit (HOSPITAL_COMMUNITY)
Admission: RE | Admit: 2023-01-01 | Discharge: 2023-01-01 | Disposition: A | Discharge status: Home / Self Care | Payer: Medicare Other | Attending: Gastroenterology | Admitting: Gastroenterology

## 2023-01-01 ENCOUNTER — Ambulatory Visit (HOSPITAL_COMMUNITY): Payer: Medicare Other | Admitting: Anesthesiology

## 2023-01-01 ENCOUNTER — Other Ambulatory Visit: Payer: Self-pay

## 2023-01-01 ENCOUNTER — Encounter (HOSPITAL_COMMUNITY): Payer: Self-pay

## 2023-01-01 DIAGNOSIS — Z7984 Long term (current) use of oral hypoglycemic drugs: Secondary | ICD-10-CM | POA: Diagnosis not present

## 2023-01-01 DIAGNOSIS — K648 Other hemorrhoids: Secondary | ICD-10-CM

## 2023-01-01 DIAGNOSIS — K760 Fatty (change of) liver, not elsewhere classified: Secondary | ICD-10-CM | POA: Diagnosis not present

## 2023-01-01 DIAGNOSIS — Z86018 Personal history of other benign neoplasm: Secondary | ICD-10-CM | POA: Insufficient documentation

## 2023-01-01 DIAGNOSIS — C155 Malignant neoplasm of lower third of esophagus: Secondary | ICD-10-CM

## 2023-01-01 DIAGNOSIS — R1310 Dysphagia, unspecified: Secondary | ICD-10-CM

## 2023-01-01 DIAGNOSIS — G4733 Obstructive sleep apnea (adult) (pediatric): Secondary | ICD-10-CM | POA: Insufficient documentation

## 2023-01-01 DIAGNOSIS — K219 Gastro-esophageal reflux disease without esophagitis: Secondary | ICD-10-CM | POA: Diagnosis not present

## 2023-01-01 DIAGNOSIS — C159 Malignant neoplasm of esophagus, unspecified: Secondary | ICD-10-CM

## 2023-01-01 DIAGNOSIS — K317 Polyp of stomach and duodenum: Secondary | ICD-10-CM | POA: Diagnosis not present

## 2023-01-01 DIAGNOSIS — Z7901 Long term (current) use of anticoagulants: Secondary | ICD-10-CM | POA: Diagnosis not present

## 2023-01-01 DIAGNOSIS — E119 Type 2 diabetes mellitus without complications: Secondary | ICD-10-CM | POA: Insufficient documentation

## 2023-01-01 DIAGNOSIS — I4891 Unspecified atrial fibrillation: Secondary | ICD-10-CM | POA: Insufficient documentation

## 2023-01-01 DIAGNOSIS — Z1211 Encounter for screening for malignant neoplasm of colon: Secondary | ICD-10-CM

## 2023-01-01 DIAGNOSIS — E039 Hypothyroidism, unspecified: Secondary | ICD-10-CM | POA: Diagnosis not present

## 2023-01-01 DIAGNOSIS — K2970 Gastritis, unspecified, without bleeding: Secondary | ICD-10-CM | POA: Diagnosis not present

## 2023-01-01 DIAGNOSIS — K644 Residual hemorrhoidal skin tags: Secondary | ICD-10-CM

## 2023-01-01 DIAGNOSIS — I10 Essential (primary) hypertension: Secondary | ICD-10-CM | POA: Diagnosis not present

## 2023-01-01 DIAGNOSIS — Z79899 Other long term (current) drug therapy: Secondary | ICD-10-CM | POA: Diagnosis not present

## 2023-01-01 DIAGNOSIS — K5730 Diverticulosis of large intestine without perforation or abscess without bleeding: Secondary | ICD-10-CM | POA: Diagnosis not present

## 2023-01-01 HISTORY — PX: COLONOSCOPY WITH PROPOFOL: SHX5780

## 2023-01-01 HISTORY — PX: BIOPSY: SHX5522

## 2023-01-01 HISTORY — PX: ESOPHAGOGASTRODUODENOSCOPY (EGD) WITH PROPOFOL: SHX5813

## 2023-01-01 HISTORY — PX: POLYPECTOMY: SHX5525

## 2023-01-01 LAB — GLUCOSE, CAPILLARY: Glucose-Capillary: 178 mg/dL — ABNORMAL HIGH (ref 70–99)

## 2023-01-01 SURGERY — COLONOSCOPY WITH PROPOFOL
Anesthesia: General

## 2023-01-01 MED ORDER — LACTATED RINGERS IV SOLN: INTRAVENOUS | Status: DC | PRN

## 2023-01-01 MED ORDER — LIDOCAINE HCL 1 % IJ SOLN
INTRAMUSCULAR | Status: DC | PRN
  Administered 2023-01-01: 50 mg via INTRADERMAL

## 2023-01-01 MED ORDER — PROPOFOL 500 MG/50ML IV EMUL
INTRAVENOUS | Status: DC | PRN
  Administered 2023-01-01: 150 ug/kg/min via INTRAVENOUS

## 2023-01-01 MED ORDER — PROPOFOL 10 MG/ML IV BOLUS
INTRAVENOUS | Status: DC | PRN
  Administered 2023-01-01: 50 mg via INTRAVENOUS
  Administered 2023-01-01 (×2): 20 mg via INTRAVENOUS
  Administered 2023-01-01: 30 mg via INTRAVENOUS
  Administered 2023-01-01: 100 mg via INTRAVENOUS
  Administered 2023-01-01: 30 mg via INTRAVENOUS

## 2023-01-01 NOTE — Op Note (Signed)
[facility] Patient Name: Tevion Deras Procedure Date: 01/01/2023 11:01 AM MRN: [id number] Date of Birth: 1956-01-13 Attending MD: Sanjuan Dame , MD, [id number] CSN: [id number] Age: 67 Admit Type: Outpatient Procedure:                Upper GI endoscopy Indications:              Dysphagia Providers:                Sanjuan Dame, MD, Angelica Ran, Lennice Sites                            Technician, Technician Referring MD:              Medicines:                Monitored Anesthesia Care Complications:            No immediate complications. Estimated Blood Loss:     Estimated blood loss was minimal. Procedure:                Pre-Anesthesia Assessment:                           - Prior to the procedure, a History and Physical                            was performed, and patient medications and                            allergies were reviewed. The patient's tolerance of                            previous anesthesia was also reviewed. The risks                            and benefits of the procedure and the sedation                            options and risks were discussed with the patient.                            All questions were answered, and informed consent                            was obtained. Prior Anticoagulants: The patient has                            taken Eliquis (apixaban), last dose was 3 days                            prior to procedure. ASA Grade Assessment: III - A                            patient with severe systemic disease. After  reviewing the risks and benefits, the patient was                            deemed in satisfactory condition to undergo the                            procedure.                           After obtaining informed consent, the endoscope was                            passed under direct vision. Throughout the                            procedure, the patient's blood pressure, pulse, and                             oxygen saturations were monitored continuously. The                            [id number] ([id number]) scope was introduced through the                            mouth, and advanced to the second part of duodenum.                            The upper GI endoscopy was accomplished without                            difficulty. The patient tolerated the procedure                            well. Scope In: 11:16:55 AM Scope Out: 11:27:05 AM Total Procedure Duration: 0 hours 10 minutes 10 seconds  Findings:      A large, fungating and ulcerating mass with no bleeding and stigmata of       recent bleeding was found in the lower third of the esophagus, 30 to 40       cm from the incisors. The mass was partially obstructing and       circumferential. Biopsies were taken with a cold forceps for histology.      Mild inflammation characterized by erythema was found in the gastric       antrum. Biopsies were taken with a cold forceps for histology.      The duodenal bulb and second portion of the duodenum were normal. Impression:               - Partially obstructing, malignant esophageal tumor                            was found in the lower third of the esophagus,                            starting at 30cm to 40cm involving GE junction .  Biopsied.                           - Gastritis. Biopsied.                           - Normal duodenal bulb and second portion of the                            duodenum. Moderate Sedation:      Per Anesthesia Care Recommendation:           - Patient has a contact number available for                            emergencies. The signs and symptoms of potential                            delayed complications were discussed with the                            patient. Return to normal activities tomorrow.                            Written discharge instructions were provided to the                             patient.                           - Resume previous diet.                           - Continue present medications.                           - Await pathology results.                           -Staging CT Chest Abdomen and Pelvis                           Oncology referral Procedure Code(s):        --- Professional ---                           [phone], Esophagogastroduodenoscopy, flexible,                            transoral; with biopsy, single or multiple Diagnosis Code(s):        --- Professional ---                           C15.5, Malignant neoplasm of lower third of                            esophagus  K29.70, Gastritis, unspecified, without bleeding                           R13.10, Dysphagia, unspecified CPT copyright 2022 American Medical Association. All rights reserved. The codes documented in this report are preliminary and upon coder review may  be revised to meet current compliance requirements. Sanjuan Dame, MD Sanjuan Dame, MD 01/01/2023 12:16:55 PM This report has been signed electronically. Number of Addenda: 0

## 2023-01-01 NOTE — Interval H&P Note (Signed)
History and Physical Interval Note:  01/01/2023 10:30 AM  Samuel Hubbard  has presented today for surgery, with the diagnosis of SCREENING, DYSPHAGIA.  The various methods of treatment have been discussed with the patient and family. After consideration of risks, benefits and other options for treatment, the patient has consented to  Procedure(s) with comments: COLONOSCOPY WITH PROPOFOL (N/A) - 12:00PM;ASA 3 ESOPHAGOGASTRODUODENOSCOPY (EGD) WITH PROPOFOL (N/A) - 12:00PM;ASA 3 as a surgical intervention.  The patient's history has been reviewed, patient examined, no change in status, stable for surgery.  I have reviewed the patient's chart and labs.  Questions were answered to the patient's satisfaction.     [name] Tidus Upchurch

## 2023-01-01 NOTE — Transfer of Care (Signed)
Immediate Anesthesia Transfer of Care Note  Patient: Samuel Hubbard  Procedure(s) Performed: COLONOSCOPY WITH PROPOFOL ESOPHAGOGASTRODUODENOSCOPY (EGD) WITH PROPOFOL BIOPSY POLYPECTOMY  Patient Location: Short Stay  Anesthesia Type:General  Level of Consciousness: awake  Airway & Oxygen Therapy: Patient Spontanous Breathing  Post-op Assessment: Report given to RN  Post vital signs: Reviewed and stable  Last Vitals:  Vitals Value Taken Time  BP    Temp    Pulse    Resp    SpO2      Last Pain:  Vitals:   01/01/23 1112  TempSrc:   PainSc: 2          Complications: No notable events documented.

## 2023-01-01 NOTE — Anesthesia Postprocedure Evaluation (Signed)
Anesthesia Post Note  Patient: Samuel Hubbard  Procedure(s) Performed: COLONOSCOPY WITH PROPOFOL ESOPHAGOGASTRODUODENOSCOPY (EGD) WITH PROPOFOL BIOPSY POLYPECTOMY  Patient location during evaluation: Short Stay Anesthesia Type: General Level of consciousness: awake and alert Pain management: pain level controlled Vital Signs Assessment: post-procedure vital signs reviewed and stable Respiratory status: spontaneous breathing Cardiovascular status: stable Postop Assessment: no apparent nausea or vomiting Anesthetic complications: no   No notable events documented.   Last Vitals:  Vitals:   01/01/23 1047  BP: 135/84  Pulse: 65  Resp: 17  Temp: 36.7 C  SpO2: 100%    Last Pain:  Vitals:   01/01/23 1112  TempSrc:   PainSc: 2                  Aricela Bertagnolli

## 2023-01-01 NOTE — Anesthesia Preprocedure Evaluation (Signed)
Anesthesia Evaluation  Patient identified by MRN, date of birth, ID band Patient awake    Reviewed: Allergy & Precautions, NPO status , Patient's Chart, lab work & pertinent test results  Airway Mallampati: II  TM Distance: >3 FB Neck ROM: Full    Dental no notable dental hx. (+) Dental Advisory Given, Teeth Intact   Pulmonary sleep apnea and Continuous Positive Airway Pressure Ventilation    Pulmonary exam normal breath sounds clear to auscultation       Cardiovascular hypertension, Pt. on medications Normal cardiovascular exam+ dysrhythmias  Rhythm:Regular Rate:Normal  AF.   Neuro/Psych   Anxiety     Spinal stenosis    GI/Hepatic Neg liver ROS,GERD  ,,  Endo/Other  diabetes, Well Controlled, Type 2Hypothyroidism  Pheochromocytoma History  Renal/GU negative Renal ROS  negative genitourinary   Musculoskeletal  (+) Arthritis , Osteoarthritis,    Abdominal   Peds negative pediatric ROS (+)  Hematology   Anesthesia Other Findings Cancer  Reproductive/Obstetrics negative OB ROS                             Anesthesia Physical Anesthesia Plan  ASA: 3  Anesthesia Plan: General   Post-op Pain Management: Minimal or no pain anticipated   Induction: Intravenous  PONV Risk Score and Plan: Propofol infusion  Airway Management Planned: Nasal Cannula and Natural Airway  Additional Equipment: None  Intra-op Plan:   Post-operative Plan:   Informed Consent: I have reviewed the patients History and Physical, chart, labs and discussed the procedure including the risks, benefits and alternatives for the proposed anesthesia with the patient or authorized representative who has indicated his/her understanding and acceptance.     Dental advisory given  Plan Discussed with: CRNA  Anesthesia Plan Comments:         Anesthesia Quick Evaluation

## 2023-01-01 NOTE — Op Note (Signed)
[facility] Patient Name: Samuel Hubbard Procedure Date: 01/01/2023 11:00 AM MRN: [id number] Date of Birth: Aug 13, 1955 Attending MD: Sanjuan Dame , MD, [id number] CSN: [id number] Age: 67 Admit Type: Outpatient Procedure:                Colonoscopy Indications:              Screening for colorectal malignant neoplasm Providers:                Sanjuan Dame, MD, Angelica Ran, Lennice Sites                            Technician, Technician Referring MD:              Medicines:                Monitored Anesthesia Care Complications:            No immediate complications. Estimated Blood Loss:     Estimated blood loss: none. Procedure:                Pre-Anesthesia Assessment:                           - Prior to the procedure, a History and Physical                            was performed, and patient medications and                            allergies were reviewed. The patient's tolerance of                            previous anesthesia was also reviewed. The risks                            and benefits of the procedure and the sedation                            options and risks were discussed with the patient.                            All questions were answered, and informed consent                            was obtained. Prior Anticoagulants: The patient has                            taken Eliquis (apixaban), last dose was 3 days                            prior to procedure. ASA Grade Assessment: III - A                            patient with severe systemic disease. After  reviewing the risks and benefits, the patient was                            deemed in satisfactory condition to undergo the                            procedure.                           After obtaining informed consent, the colonoscope                            was passed under direct vision. Throughout the                            procedure, the patient's blood  pressure, pulse, and                            oxygen saturations were monitored continuously. The                            [id number]) scope was introduced through the                            anus and advanced to the the cecum, identified by                            the appendiceal orifice. The colonoscopy was                            technically difficult and complex due to a tortuous                            colon. Successful completion of the procedure was                            aided by changing the patient to a supine position                            and applying abdominal pressure. The patient                            tolerated the procedure well. The quality of the                            bowel preparation was fair. Scope In: 11:32:01 AM Scope Out: 11:59:19 AM Scope Withdrawal Time: 0 hours 24 minutes 20 seconds  Total Procedure Duration: 0 hours 27 minutes 18 seconds  Findings:      The perianal and digital rectal examinations were normal. Pertinent       negatives include normal sphincter tone.      A moderate amount of stool was found in the entire colon, precluding       visualization. Lavage of the area was performed using a moderate amount  of sterile water, resulting in clearance with fair visualization.      Scattered diverticula were found in the left colon.      Non-bleeding external and internal hemorrhoids were found during       retroflexion. Impression:               - Preparation of the colon was fair.                           - Stool in the entire examined colon.                           - Diverticulosis in the left colon.                           - Non-bleeding external and internal hemorrhoids.                           - No specimens collected. Moderate Sedation:      Per Anesthesia Care Recommendation:           - Patient has a contact number available for                            emergencies. The signs and symptoms  of potential                            delayed complications were discussed with the                            patient. Return to normal activities tomorrow.                            Written discharge instructions were provided to the                            patient.                           - Resume previous diet.                           - Continue present medications.                           - Repeat colonoscopy in 1 year for screening                            purposes given fair prep ; although would depend on                            patient clinical status given new diagnosis of                            esophageal tumor                           -  Return to primary care physician as previously                            scheduled. Procedure Code(s):        --- Professional ---                           W0981, Colorectal cancer screening; colonoscopy on                            individual not meeting criteria for high risk Diagnosis Code(s):        --- Professional ---                           Z12.11, Encounter for screening for malignant                            neoplasm of colon                           K64.8, Other hemorrhoids                           K57.30, Diverticulosis of large intestine without                            perforation or abscess without bleeding CPT copyright 2022 American Medical Association. All rights reserved. The codes documented in this report are preliminary and upon coder review may  be revised to meet current compliance requirements. Sanjuan Dame, MD Sanjuan Dame, MD 01/01/2023 12:21:12 PM This report has been signed electronically. Number of Addenda: 0

## 2023-01-01 NOTE — Discharge Instructions (Signed)

## 2023-01-02 ENCOUNTER — Telehealth (INDEPENDENT_AMBULATORY_CARE_PROVIDER_SITE_OTHER): Payer: Self-pay | Admitting: Gastroenterology

## 2023-01-02 NOTE — Telephone Encounter (Signed)
Samuel Hubbard, [name], MD  [name], LPN; Merrily Brittle: can you please schedule this CT Chest Abdomen and Pelvis  [name]: Can you please refer this patient to oncology  Diagnosis : Esophageal cancer  Pt has already been scheduled. Central Scheduling spoke with pt yesterday and schedule him.

## 2023-01-04 DIAGNOSIS — R1310 Dysphagia, unspecified: Secondary | ICD-10-CM | POA: Insufficient documentation

## 2023-01-04 DIAGNOSIS — Z23 Encounter for immunization: Secondary | ICD-10-CM | POA: Insufficient documentation

## 2023-01-04 NOTE — Assessment & Plan Note (Signed)
Samuel Hubbard acute concern today is intermittent dysphagia to both solids and liquids that has been present for the last 6 months.  He has lost 11 pounds since his last appointment, but mostly attributes this to dietary changes.  No other B symptoms identified. -Gastroenterology referral placed today for further evaluation -Check iron studies

## 2023-01-04 NOTE — Assessment & Plan Note (Signed)
Lipid panel updated in September 2023.  Total cholesterol 152 and LDL 72.  He is currently prescribed atorvastatin 40 mg daily. -Repeat lipid panel ordered today.

## 2023-01-04 NOTE — Assessment & Plan Note (Signed)
 Remains adequately controlled on current antihypertensive regimen.  No medication changes are indicated today.

## 2023-01-04 NOTE — Assessment & Plan Note (Signed)
A1c 8.2 on labs from April.  He is currently to metformin XR 1000 mg twice daily and Jardiance 25 mg daily.  We previously discussed GLP-1 therapy, however he preferred to focus on dietary changes aimed at weight loss and improving his A1c.  He has lost 11 pounds since his last appointment. -Repeat A1c ordered today -Diabetic foot exam completed

## 2023-01-04 NOTE — Assessment & Plan Note (Signed)
Asymptomatic currently.  He remains on levothyroxine 112 mcg daily. -Repeat TFTs ordered today.

## 2023-01-04 NOTE — Assessment & Plan Note (Signed)
Influenza vaccine administered today.

## 2023-01-05 ENCOUNTER — Telehealth (INDEPENDENT_AMBULATORY_CARE_PROVIDER_SITE_OTHER): Payer: Self-pay | Admitting: *Deleted

## 2023-01-05 ENCOUNTER — Ambulatory Visit (HOSPITAL_COMMUNITY)
Admission: RE | Admit: 2023-01-05 | Discharge: 2023-01-05 | Disposition: A | Discharge status: Home / Self Care | Payer: Medicare Other | Source: Ambulatory Visit | Attending: Gastroenterology | Admitting: Gastroenterology

## 2023-01-05 DIAGNOSIS — C155 Malignant neoplasm of lower third of esophagus: Secondary | ICD-10-CM | POA: Insufficient documentation

## 2023-01-05 LAB — SURGICAL PATHOLOGY

## 2023-01-05 MED ORDER — IOHEXOL 300 MG/ML  SOLN
100.0000 mL | Freq: Once | INTRAMUSCULAR | Status: AC | PRN
  Administered 2023-01-05: 100 mL via INTRAVENOUS

## 2023-01-05 NOTE — Telephone Encounter (Signed)
Dr. Renette Butters - pathologist at [facility] long  wanted to speak with Dr. Tasia Catchings. ( Dr. Tasia Catchings out of office til Thursday )   [phone]

## 2023-01-05 NOTE — Telephone Encounter (Signed)
I spoke with Dr. Renette Butters via telephone. The path is consistent with poorly differentiated carcinoma. He wanted to ensure that we knew so we could contact the patient before it was released in MyChart.  I contacted patient personally to inform. He is already aware likely dealing with malignancy. CT for staging purposes is scheduled this afternoon. Oncology appt upcoming Thursday. He had no questions.  Dr. Tasia Catchings: just FYI.

## 2023-01-07 ENCOUNTER — Encounter (HOSPITAL_COMMUNITY): Payer: Self-pay | Admitting: Gastroenterology

## 2023-01-08 ENCOUNTER — Inpatient Hospital Stay: Payer: Medicare Other | Attending: Oncology | Admitting: Oncology

## 2023-01-08 ENCOUNTER — Telehealth: Payer: Self-pay | Admitting: Gastroenterology

## 2023-01-08 ENCOUNTER — Inpatient Hospital Stay: Payer: Medicare Other

## 2023-01-08 VITALS — BP 137/84 | HR 68 | Temp 96.6°F | Resp 20 | Ht 71.85 in | Wt 241.6 lb

## 2023-01-08 DIAGNOSIS — Z85850 Personal history of malignant neoplasm of thyroid: Secondary | ICD-10-CM | POA: Diagnosis not present

## 2023-01-08 DIAGNOSIS — I4891 Unspecified atrial fibrillation: Secondary | ICD-10-CM | POA: Insufficient documentation

## 2023-01-08 DIAGNOSIS — I10 Essential (primary) hypertension: Secondary | ICD-10-CM | POA: Insufficient documentation

## 2023-01-08 DIAGNOSIS — Z7984 Long term (current) use of oral hypoglycemic drugs: Secondary | ICD-10-CM | POA: Insufficient documentation

## 2023-01-08 DIAGNOSIS — K219 Gastro-esophageal reflux disease without esophagitis: Secondary | ICD-10-CM | POA: Diagnosis not present

## 2023-01-08 DIAGNOSIS — Z85840 Personal history of malignant neoplasm of eye: Secondary | ICD-10-CM | POA: Insufficient documentation

## 2023-01-08 DIAGNOSIS — Z79899 Other long term (current) drug therapy: Secondary | ICD-10-CM | POA: Diagnosis not present

## 2023-01-08 DIAGNOSIS — E039 Hypothyroidism, unspecified: Secondary | ICD-10-CM | POA: Diagnosis not present

## 2023-01-08 DIAGNOSIS — E119 Type 2 diabetes mellitus without complications: Secondary | ICD-10-CM | POA: Diagnosis not present

## 2023-01-08 DIAGNOSIS — C155 Malignant neoplasm of lower third of esophagus: Secondary | ICD-10-CM | POA: Diagnosis not present

## 2023-01-08 DIAGNOSIS — Z85820 Personal history of malignant melanoma of skin: Secondary | ICD-10-CM | POA: Diagnosis not present

## 2023-01-08 NOTE — Telephone Encounter (Addendum)
PET/CT scheduled for December. EUS would have to be performed after PET/CT. If PET does show evidence of metastatic disease because then EUS not really needed. In any case however, my next available EUS slot is not going to be until most likely end of December versus beginning of January. Thus unfortunately this patient likely needs to be referred elsewhere if the date for EUS is too far out. Things can change over the course of the weeks but I am not sure that I am going to have availability.  I am putting my nurse on here so that she can place the patient on for next available EUS, but again if it is too far out, then referring gastroenterologist and oncologist will need to refer elsewhere. GM  Patty, When does it look like my next available slot would be after 12/5? GM

## 2023-01-08 NOTE — Patient Instructions (Addendum)
[facility]  Discharge Instructions  You were seen and examined today by Dr. Anders Simmonds. Dr. Anders Simmonds is a medical oncologist, meaning that she specializes in the treatment of cancer diagnoses. Dr. Anders Simmonds discussed your past medical history, family history of cancers, and the events that led to you being here today.  You were referred to Dr. Anders Simmonds due to a new diagnosis of esophageal cancer. There was no spread of the cancer beyond the esophagus, this is good news.  Esophageal cancer is typically treated with concurrent chemotherapy and radiation therapy followed by surgery. Radiation occurs daily while chemotherapy occurs once a week. We will refer you to meet with our local Radiation Oncologist, Dr. Langston Masker in [place] at [place] to discuss radiation treatments. We will refer you to a surgeon for evaluation prior to the start of treatment. The surgeon is in [place] at [facility].  In order to get accurate staging, we will discuss your case with Dr. Warner Mccreedy and see if there is a specialized endoscopy known as an EUS (endoscopic ultrasound).  Prior to the start of treatment, you will meet with our nutritionist.  You will also need a Port-A-Cath prior to the start of chemotherapy. We will arrange this later.  Follow-up as scheduled.  Thank you for choosing [facility] to provide your oncology and hematology care.   To afford each patient quality time with our provider, please arrive at least 15 minutes before your scheduled appointment time. You may need to reschedule your appointment if you arrive late (10 or more minutes). Arriving late affects you and other patients whose appointments are after yours.  Also, if you miss three or more appointments without notifying the office, you may be dismissed from the clinic at the provider's discretion.    Again, thank you for choosing [facility]  Our hope is  that these requests will decrease the amount of time that you wait before being seen by our physicians.   If you have a lab appointment with the Cancer Center - please note that after April 8th, all labs will be drawn in the cancer center.  You do not have to check in or register with the main entrance as you have in the past but will complete your check-in at the cancer center.            _____________________________________________________________  Should you have questions after your visit to [facility], please contact our office at [phone] and follow the prompts.  Our office hours are 8:00 a.m. to 4:30 p.m. Monday - Thursday and 8:00 a.m. to 2:30 p.m. Friday.  Please note that voicemails left after 4:00 p.m. may not be returned until the following business day.  We are closed weekends and all major holidays.  You do have access to a nurse 24-7, just call the main number to the clinic [phone] and do not press any options, hold on the line and a nurse will answer the phone.    For prescription refill requests, have your pharmacy contact our office and allow 72 hours.    Masks are no longer required in the cancer centers. If you would like for your care team to wear a mask while they are taking care of you, please let them know. You may have one support person who is at least 66 years old accompany you for your appointments.

## 2023-01-08 NOTE — Assessment & Plan Note (Signed)
Newly diagnosed, localized disease with tumor confined to the esophagus. No evidence of metastasis on CT scan. Tumor size is 8.5 cm. Patient has a history of multiple cancers (eye melanoma, skin melanoma, thyroid cancer, and benign pheochromocytoma). Discussed the potential treatment options including radiation, chemotherapy, and surgery. The sequence of these treatments will be determined after further staging and multidisciplinary discussion. -Refer to surg-onc/GI surgery and radiation oncologist for further evaluation. -Plan for endoscopic ultrasound to determine the depth of the cancer and stage the disease. Refer to GI in [place]. -Discuss the case in the multidisciplinary tumor board meeting. -Consider genetic testing given the patient's history of multiple cancers. -Prepare for potential placement of feeding tube due to anticipated difficulty swallowing during treatment and nutrition consult. -Order NGS on tissue. -Plan for PET scan on 01/22/2023 for further staging.  RTC in 2 weeks to discuss further steps in management

## 2023-01-08 NOTE — Telephone Encounter (Signed)
Dr. Meridee Score,  Urgent referral in [place] for possible EUS for Malignant neoplasm of lower third of esophagus (records in EPIC).  Please advise scheduling.    Thanks [employer]
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
## 2023-01-12 NOTE — Progress Notes (Signed)
Attempted to call patient many times since last week . HIPAA complaint VM was left Today I spoke to patient wife Brow,Joyce (Spouse) [phone] ([place])  No evidence of metastasis of this large esophageal adenocarcinoma . Patient is scheduled for PET/CT on 12/5 , and advised to continue following up with Oncology .

## 2023-01-13 ENCOUNTER — Other Ambulatory Visit: Payer: Self-pay

## 2023-01-13 DIAGNOSIS — C155 Malignant neoplasm of lower third of esophagus: Secondary | ICD-10-CM

## 2023-01-13 NOTE — Telephone Encounter (Signed)
EUS has been set up for 03/05/23 at 1015 am at [facility] with GM   Left message on machine to call back

## 2023-01-14 ENCOUNTER — Other Ambulatory Visit: Payer: Self-pay | Admitting: *Deleted

## 2023-01-14 NOTE — Progress Notes (Signed)
The proposed treatment discussed in conference is for discussion purpose only and is not a binding recommendation.  The patients have not been physically examined, or presented with their treatment options.  Therefore, final treatment plans cannot be decided.  

## 2023-01-14 NOTE — Telephone Encounter (Signed)
EUS scheduled, pt instructed and medications reviewed.  Patient instructions mailed to home.  Patient to call with any questions or concerns.

## 2023-01-22 ENCOUNTER — Inpatient Hospital Stay: Payer: Medicare Other | Admitting: Dietician

## 2023-01-22 ENCOUNTER — Encounter (HOSPITAL_COMMUNITY)
Admission: RE | Admit: 2023-01-22 | Discharge: 2023-01-22 | Disposition: A | Discharge status: Home / Self Care | Payer: Medicare Other | Source: Ambulatory Visit | Attending: Oncology | Admitting: Oncology

## 2023-01-22 ENCOUNTER — Inpatient Hospital Stay: Payer: Medicare Other | Attending: Oncology | Admitting: Licensed Clinical Social Worker

## 2023-01-22 ENCOUNTER — Other Ambulatory Visit: Payer: Self-pay | Admitting: Oncology

## 2023-01-22 DIAGNOSIS — C155 Malignant neoplasm of lower third of esophagus: Secondary | ICD-10-CM | POA: Diagnosis present

## 2023-01-22 DIAGNOSIS — Z801 Family history of malignant neoplasm of trachea, bronchus and lung: Secondary | ICD-10-CM | POA: Insufficient documentation

## 2023-01-22 DIAGNOSIS — Z79899 Other long term (current) drug therapy: Secondary | ICD-10-CM | POA: Insufficient documentation

## 2023-01-22 DIAGNOSIS — Z803 Family history of malignant neoplasm of breast: Secondary | ICD-10-CM | POA: Insufficient documentation

## 2023-01-22 DIAGNOSIS — Z5111 Encounter for antineoplastic chemotherapy: Secondary | ICD-10-CM | POA: Insufficient documentation

## 2023-01-22 MED ORDER — FLUDEOXYGLUCOSE F - 18 (FDG) INJECTION
12.1400 | Freq: Once | INTRAVENOUS | Status: AC | PRN
  Administered 2023-01-22: 12.14 via INTRAVENOUS

## 2023-01-22 MED ORDER — TRAMADOL HCL 50 MG PO TABS: 50.0000 mg | ORAL_TABLET | Freq: Four times a day (QID) | ORAL | 2 refills | Status: AC | PRN

## 2023-01-22 NOTE — Progress Notes (Signed)
Nutrition Assessment   Reason for Assessment: New Esophageal    ASSESSMENT: 67 year old male with newly diagnosed malignant neoplasm of lower third of esophagus. Treatment plan currently under workup. Patient is under the care of Dr. Anders Simmonds  Past medical history includes HTN, atrial fibrillation, OSA on CPAP, GERD, DM2, hypothyroidism, HLD, impaired vision, diverticulosis, thyroid cancer, benign pheochromcytoma, melanoma  Met with patient and wife in clinic. Patient reports intermittent dysphagia with solids. Liquids are tolerated well. He endorses good appetite. Since retiring in April, eating habits are not consistent. States he typically has one good meal and snacks. Yesterday pt had buttered toast and tea ~11AM and grilled chicken sandwich ~5PM. Patient reports a few cookies later in the evening. Patient does not like fruits or vegetables. He is allergic to nuts and legumes. Patient reports drinking a good amount of water. Patient does not like to sit still. Enjoys being active, working in the yard. 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 Patient denies nausea, vomiting. He reports loose stools secondary to metformin and occasional constipation. Patient is not sleeping well due to knee pain. He is taking advil without relief. Wife reports patient needs knee replacement, but this is now put on hold.   Nutrition Focused Physical Exam: deferred    Medications: amlodipine, eliquis, lipitor, celexa, flexeril, hydrodiuril, synthroid, metformin, lopressor, benicar, protonix   Labs: 10/14 HgbA1c 8.7 - pt does not monitor blood sugars at home   Anthropometrics:   Height: 5'11.85" Weight: 241 lb 9.6 oz (11/21) UBW: 260 lb 9.6 oz (December 2023) BMI: 32.9   Estimated Energy Needs  Kcals: 2700-3000 Protein: 130-153 Fluid: 3L   NUTRITION DIAGNOSIS: Food and nutrition related knowledge deficit related to newly diagnosed cancer as evidenced by no prior need for associated  nutrition information   INTERVENTION:  Educated on importance of adequate calorie and protein energy intake to maintain strength/weights, preserve LBM Discussed eating smaller more frequent meals to promote adequate energy intake and maintain good glucose control Educated on foods with protein, recommend including protein at every meal Discussed importance of hydration, recommend 3L/day Educated on strategies for dysphagia, encouraged soft moist foods and ways to alter textures for ease of intake Encouraged activity as able  Discussed knee pain with Dr. Anders Simmonds - MD to call in tramadol to pt preferred pharmacy Contact information provided    MONITORING, EVALUATION, GOAL: Pt will tolerate adequate calories and protein to minimize wt loss during treatment    Next Visit: To be determined pending treatment plan - pt to see Dr. Cliffton Asters 12/13

## 2023-01-22 NOTE — Progress Notes (Signed)
[facility] Clinical Social Work  Initial Assessment   Samuel Hubbard is a 67 y.o. year old male accompanied by patient and wife. Clinical Social Work was referred by medical provider for assessment of psychosocial needs.   SDOH (Social Determinants of Health) assessments performed: Yes SDOH Interventions    Flowsheet Row Office Visit from 02/25/2022 in [facility]  SDOH Interventions   Food Insecurity Interventions Intervention Not Indicated  Housing Interventions Intervention Not Indicated  Transportation Interventions Intervention Not Indicated  Utilities Interventions Intervention Not Indicated  Alcohol Usage Interventions Intervention Not Indicated (Score <7)  Financial Strain Interventions Intervention Not Indicated  Physical Activity Interventions Intervention Not Indicated  Stress Interventions Intervention Not Indicated  Social Connections Interventions Intervention Not Indicated       SDOH Screenings   Food Insecurity: Low Risk  (09/03/2022)   Received from [facility]  Housing: Low Risk  (02/25/2022)  Transportation Needs: No Transportation Needs (02/25/2022)  Utilities: Not At Risk (02/25/2022)  Alcohol Screen: Low Risk  (02/25/2022)  Depression (PHQ2-9): Low Risk  (12/01/2022)  Financial Resource Strain: Low Risk  (02/25/2022)  Physical Activity: Sufficiently Active (02/25/2022)  Social Connections: Moderately Isolated (02/25/2022)  Stress: No Stress Concern Present (02/25/2022)  Tobacco Use: Low Risk  (01/14/2023)   Received from [facility] Literacy: Low Risk  (01/17/2021)   Received from [facility]     Distress Screen completed: No     No data to display            Family/Social Information:  Housing Arrangement: patient lives with his wife Alona Bene.   Family members/support persons in your life? Pt's spouse will be retiring from her accounting position in 2 weeks and pt has also recently retired.  Pt's wife will be the one primarily  providing support while pt undergoes treatment.  CSW encouraged both pt and wife to consider thinking about additional support as pt's wife may need some respite through treatment.   Transportation concerns: no  Employment: Retired from the [employer].  Income source: Actor concerns: No Type of concern: None Food access concerns: no Religious or spiritual practice: Software engineer Currently in place:  none  Coping/ Adjustment to diagnosis: Patient understands treatment plan and what happens next? Pt has not yet completed diagnostics, treatment plan is not yet decided upon. Concerns about diagnosis and/or treatment: Overwhelmed by information Patient reported stressors: Anxiety/ nervousness and Adjusting to my illness Hopes and/or priorities: Pt's priority is to complete diagnostics and begin treatment w/ the hope of positive results.  Patient enjoys time with family/ friends Current coping skills/ strengths: Capable of independent living , Motivation for treatment/growth , Physical Health , and Supportive family/friends     SUMMARY: Current SDOH Barriers:  No barriers identified at this time.  Clinical Social Work Clinical Goal(s):  No clinical social work goals at this time  Interventions: Discussed common feeling and emotions when being diagnosed with cancer, and the importance of support during treatment Informed patient of the support team roles and support services at [facility] Provided CSW contact information and encouraged patient to call with any questions or concerns Referred patient to [facility].   Follow Up Plan: Patient will contact CSW with any support or resource needs Patient verbalizes understanding of plan: Yes    Rachel Moulds, LCSW Clinical Social Worker [facility]

## 2023-01-28 NOTE — Telephone Encounter (Signed)
Inbound call from patient returning phone call. States call was disconnected. Please advise, thank you

## 2023-01-28 NOTE — Telephone Encounter (Signed)
The pt has agreed to the change to 02/02/23 at 945 am at [facility] with GM.  He has been re-instructed and all questions answered to the best of my ability.

## 2023-01-28 NOTE — Telephone Encounter (Signed)
PT is returning call. Please advise.  

## 2023-01-28 NOTE — Telephone Encounter (Signed)
 Inbound call from patient, returning Patty's call.

## 2023-01-28 NOTE — Telephone Encounter (Signed)
Left message on machine to call back to see if the pt can do the 12/16 date.

## 2023-01-29 ENCOUNTER — Inpatient Hospital Stay: Payer: Medicare Other

## 2023-01-29 ENCOUNTER — Encounter: Payer: Self-pay | Admitting: Licensed Clinical Social Worker

## 2023-01-29 ENCOUNTER — Inpatient Hospital Stay: Payer: Medicare Other | Admitting: Licensed Clinical Social Worker

## 2023-01-29 DIAGNOSIS — Z801 Family history of malignant neoplasm of trachea, bronchus and lung: Secondary | ICD-10-CM

## 2023-01-29 DIAGNOSIS — Z85820 Personal history of malignant melanoma of skin: Secondary | ICD-10-CM

## 2023-01-29 DIAGNOSIS — C155 Malignant neoplasm of lower third of esophagus: Secondary | ICD-10-CM

## 2023-01-29 DIAGNOSIS — Z85850 Personal history of malignant neoplasm of thyroid: Secondary | ICD-10-CM

## 2023-01-29 DIAGNOSIS — Z86018 Personal history of other benign neoplasm: Secondary | ICD-10-CM

## 2023-01-29 DIAGNOSIS — Z803 Family history of malignant neoplasm of breast: Secondary | ICD-10-CM
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
## 2023-01-30 ENCOUNTER — Encounter: Payer: Medicare Other | Admitting: Thoracic Surgery (Cardiothoracic Vascular Surgery)

## 2023-02-02 ENCOUNTER — Other Ambulatory Visit: Payer: Self-pay

## 2023-02-02 ENCOUNTER — Ambulatory Visit (HOSPITAL_COMMUNITY)
Admission: RE | Admit: 2023-02-02 | Discharge: 2023-02-02 | Disposition: A | Discharge status: Other Acute Inpatient Hospital | Payer: Medicare Other | Attending: Gastroenterology | Admitting: Gastroenterology

## 2023-02-02 ENCOUNTER — Encounter (HOSPITAL_COMMUNITY)
Admission: RE | Disposition: A | Discharge status: Other Acute Inpatient Hospital | Payer: Self-pay | Source: Home / Self Care | Attending: Gastroenterology

## 2023-02-02 ENCOUNTER — Ambulatory Visit (HOSPITAL_BASED_OUTPATIENT_CLINIC_OR_DEPARTMENT_OTHER): Payer: Medicare Other | Admitting: Anesthesiology

## 2023-02-02 ENCOUNTER — Ambulatory Visit (HOSPITAL_COMMUNITY): Payer: Medicare Other | Admitting: Anesthesiology

## 2023-02-02 ENCOUNTER — Encounter (HOSPITAL_COMMUNITY): Payer: Self-pay | Admitting: Gastroenterology

## 2023-02-02 DIAGNOSIS — E119 Type 2 diabetes mellitus without complications: Secondary | ICD-10-CM | POA: Insufficient documentation

## 2023-02-02 DIAGNOSIS — K449 Diaphragmatic hernia without obstruction or gangrene: Secondary | ICD-10-CM | POA: Diagnosis not present

## 2023-02-02 DIAGNOSIS — R590 Localized enlarged lymph nodes: Secondary | ICD-10-CM | POA: Diagnosis not present

## 2023-02-02 DIAGNOSIS — K2289 Other specified disease of esophagus: Secondary | ICD-10-CM

## 2023-02-02 DIAGNOSIS — Z7984 Long term (current) use of oral hypoglycemic drugs: Secondary | ICD-10-CM | POA: Insufficient documentation

## 2023-02-02 DIAGNOSIS — I4891 Unspecified atrial fibrillation: Secondary | ICD-10-CM | POA: Insufficient documentation

## 2023-02-02 DIAGNOSIS — C155 Malignant neoplasm of lower third of esophagus: Secondary | ICD-10-CM | POA: Diagnosis not present

## 2023-02-02 DIAGNOSIS — C160 Malignant neoplasm of cardia: Secondary | ICD-10-CM | POA: Insufficient documentation

## 2023-02-02 DIAGNOSIS — G4733 Obstructive sleep apnea (adult) (pediatric): Secondary | ICD-10-CM | POA: Insufficient documentation

## 2023-02-02 DIAGNOSIS — I10 Essential (primary) hypertension: Secondary | ICD-10-CM | POA: Insufficient documentation

## 2023-02-02 DIAGNOSIS — K3189 Other diseases of stomach and duodenum: Secondary | ICD-10-CM

## 2023-02-02 HISTORY — PX: ESOPHAGOGASTRODUODENOSCOPY (EGD) WITH PROPOFOL: SHX5813

## 2023-02-02 HISTORY — PX: EUS: SHX5427

## 2023-02-02 LAB — GLUCOSE, CAPILLARY: Glucose-Capillary: 230 mg/dL — ABNORMAL HIGH (ref 70–99)

## 2023-02-02 SURGERY — UPPER ENDOSCOPIC ULTRASOUND (EUS) RADIAL
Anesthesia: Monitor Anesthesia Care

## 2023-02-02 MED ORDER — PROPOFOL 500 MG/50ML IV EMUL
INTRAVENOUS | Status: DC | PRN
  Administered 2023-02-02: 150 ug/kg/min via INTRAVENOUS

## 2023-02-02 MED ORDER — PROPOFOL 10 MG/ML IV BOLUS
INTRAVENOUS | Status: AC
  Filled 2023-02-02: qty 20

## 2023-02-02 MED ORDER — PROPOFOL 1000 MG/100ML IV EMUL
INTRAVENOUS | Status: AC
  Filled 2023-02-02: qty 100

## 2023-02-02 MED ORDER — PROPOFOL 10 MG/ML IV BOLUS
INTRAVENOUS | Status: DC | PRN
  Administered 2023-02-02 (×3): 50 mg via INTRAVENOUS
  Administered 2023-02-02: 100 mg via INTRAVENOUS

## 2023-02-02 MED ORDER — SODIUM CHLORIDE 0.9 % IV SOLN: INTRAVENOUS | Status: DC

## 2023-02-02 NOTE — Anesthesia Preprocedure Evaluation (Addendum)
Anesthesia Evaluation  Patient identified by MRN, date of birth, ID band Patient awake    Reviewed: Allergy & Precautions, NPO status , Patient's Chart, lab work & pertinent test results  Airway Mallampati: II  TM Distance: >3 FB Neck ROM: Full    Dental no notable dental hx.    Pulmonary sleep apnea and Continuous Positive Airway Pressure Ventilation    Pulmonary exam normal        Cardiovascular hypertension, Pt. on medications and Pt. on home beta blockers + dysrhythmias Atrial Fibrillation  Rhythm:Regular Rate:Normal     Neuro/Psych   Anxiety     negative neurological ROS     GI/Hepatic Neg liver ROS,GERD  ,,  Endo/Other  diabetes, Type 2, Oral Hypoglycemic AgentsHypothyroidism    Renal/GU   negative genitourinary   Musculoskeletal  (+) Arthritis , Osteoarthritis,    Abdominal Normal abdominal exam  (+)   Peds  Hematology Lab Results      Component                Value               Date                      WBC                      8.2                 12/01/2022                HGB                      14.9                12/01/2022                HCT                      46.0                12/01/2022                MCV                      87                  12/01/2022                PLT                      273                 12/01/2022              Anesthesia Other Findings   Reproductive/Obstetrics                             Anesthesia Physical Anesthesia Plan  ASA: 3  Anesthesia Plan: MAC   Post-op Pain Management:    Induction: Intravenous  PONV Risk Score and Plan: 1 and Propofol infusion and Treatment may vary due to age or medical condition  Airway Management Planned: Simple Face Mask and Nasal Cannula  Additional Equipment: None  Intra-op Plan:   Post-operative Plan:   Informed Consent: I have reviewed the patients History and Physical, chart,  labs and  discussed the procedure including the risks, benefits and alternatives for the proposed anesthesia with the patient or authorized representative who has indicated his/her understanding and acceptance.     Dental advisory given  Plan Discussed with: CRNA  Anesthesia Plan Comments:        Anesthesia Quick Evaluation
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
## 2023-02-02 NOTE — Discharge Instructions (Signed)

## 2023-02-02 NOTE — Anesthesia Procedure Notes (Addendum)
Procedure Name: MAC Date/Time: 02/02/2023 1:48 PM  Performed by: Maurene Capes, CRNAPre-anesthesia Checklist: Patient identified, Emergency Drugs available, Suction available, Patient being monitored and Timeout performed Patient Re-evaluated:Patient Re-evaluated prior to induction Oxygen Delivery Method: Nasal cannula Preoxygenation: Pre-oxygenation with 100% oxygen Induction Type: IV induction Placement Confirmation: positive ETCO2 Dental Injury: Teeth and Oropharynx as per pre-operative assessment

## 2023-02-02 NOTE — Op Note (Signed)
[facility] Patient Name: Samuel Hubbard Procedure Date: 02/02/2023 MRN: [id number] Attending MD: Corliss Parish , MD, [id number] Date of Birth: 04-07-1955 CSN: [id number] Age: 67 Admit Type: Ambulatory Procedure:                Upper EUS Indications:              Pre-treatment staging of esophageal adenocarcinoma Providers:                Corliss Parish, MD, Fransisca Connors, Harrington Challenger, Technician, Caprice Red, CRNA Referring MD:              Medicines:                Monitored Anesthesia Care Complications:            No immediate complications. Estimated Blood Loss:     Estimated blood loss was minimal. Procedure:                Pre-Anesthesia Assessment:                           - Prior to the procedure, a History and Physical                            was performed, and patient medications and                            allergies were reviewed. The patient's tolerance of                            previous anesthesia was also reviewed. The risks                            and benefits of the procedure and the sedation                            options and risks were discussed with the patient.                            All questions were answered, and informed consent                            was obtained. Prior Anticoagulants: The patient has                            taken Eliquis (apixaban), last dose was 3 days                            prior to procedure. ASA Grade Assessment: III - A                            patient with severe systemic disease. After  reviewing the risks and benefits, the patient was                            deemed in satisfactory condition to undergo the                            procedure.                           After obtaining informed consent, the endoscope was                            passed under direct vision. Throughout the                             procedure, the patient's blood pressure, pulse, and                            oxygen saturations were monitored continuously. The                            [id number]-[id number] ([id number]) Olympus endoscope was introduced                            through the mouth, and advanced to the second part                            of duodenum. The [id number]-[id number] ([id number]) Olympus                            radial ultrasound scope was introduced through the                            mouth, and advanced to the stomach for ultrasound                            examination from the esophagus and stomach. The                            upper EUS was accomplished without difficulty. The                            patient tolerated the procedure. Scope In: Scope Out: Findings:      ENDOSCOPIC FINDING: :      No gross lesions were noted in the proximal esophagus.      A large, fungating and ulcerating mass with bleeding and stigmata of       recent bleeding was found in the distal esophagus, at the       gastroesophageal junction and extending into the cardia, 32 to 42 cm       from the incisors. The mass was partially obstructing and       circumferential (until the region in the cardia which was only       semi-circumferential).      The Z-line was irregular and was found  40 cm from the incisors.      A 2 cm hiatal hernia was present.      Patchy mildly erythematous mucosa without bleeding was found in the       entire examined stomach (previously biopsied).      No gross lesions were noted in the duodenal bulb, in the first portion       of the duodenum and in the second portion of the duodenum.      ENDOSONOGRAPHIC FINDING: :      A hypoechoic mass was found in the thoracic esophagus and in the       gastroesophageal junction and extending into the cardia. The mass was       encountered at 32 cm from the incisors and extended to 42 cm. The lesion       was circumferential. The endosonographic borders were  well-defined.       There was sonographic evidence suggesting invasion into the muscularis       propria (Layer 4) and the adventitia (Layer 5).      One enlarged lymph node was visualized in the lower paraesophageal       mediastinum (level 8L) with the ultrasound probe located 37 cm from the       incisors. It measured 4 mm by 3 mm in maximal cross-sectional diameter.       The node was round, hypoechoic and had well defined margins. FNB not       performed as we would have had to go through the esophagus cancer to       reach the lymph node.      Endosonographic imaging in the visualized portion of the liver showed no       mass.      The celiac region was visualized. Impression:               EGD Impression:                           - No gross lesions in the proximal esophagus.                            Partially obstructing, malignant esophageal tumor                            was found at the distal esophagus/gastroesophageal                            junction and extending into the cardia.                           - Z-line irregular, 40 cm from the incisors.                           - 2 cm hiatal hernia.                           - Erythematous mucosa in the stomach (previously                            biopsied).                           -  No gross lesions in the duodenal bulb, in the                            first portion of the duodenum and in the second                            portion of the duodenum.                           EUS impression:                           - A mass was found in the thoracic esophagus and in                            the gastroesophageal junction and extending into                            the cardia (32 cm to 42 cm). A tissue diagnosis was                            obtained prior to this exam. This is of                            adenocarcinoma. This was staged T3 N0 Mx by                            endosonographic criteria. The  staging applies if                            malignancy is confirmed -cavity as described below                            in regards to the lymph node.                           - One enlarged lymph node was visualized in the                            lower paraesophageal mediastinum (level 8L). Tissue                            has not been obtained. However, the endosonographic                            appearance if larger or if able to be sampled would                            be concerning for the potential of a local regional                            metastatic lymph node (thankfully PET/CT was  negative). Moderate Sedation:      Not Applicable - Patient had care per Anesthesia. Recommendation:           - The patient will be observed post-procedure,                            until all discharge criteria are met.                           - Discharge patient to home.                           - Patient has a contact number available for                            emergencies. The signs and symptoms of potential                            delayed complications were discussed with the                            patient. Return to normal activities tomorrow.                            Written discharge instructions were provided to the                            patient.                           - Soft diet.                           - Observe patient's clinical course.                           - Follow-up with oncology and thoracic surgery for                            next steps in evaluation and treatment.                           - The findings and recommendations were discussed                            with the patient.                           - The findings and recommendations were discussed                            with the patient's family. Procedure Code(s):        --- Professional ---                           [phone],  Esophagogastroduodenoscopy, flexible,  transoral; with endoscopic ultrasound examination                            limited to the esophagus, stomach or duodenum, and                            adjacent structures Diagnosis Code(s):        --- Professional ---                           C16.0, Malignant neoplasm of cardia                           K22.89, Other specified disease of esophagus                           K44.9, Diaphragmatic hernia without obstruction or                            gangrene                           K31.89, Other diseases of stomach and duodenum                           C15.4, Malignant neoplasm of middle third of                            esophagus                           R59.0, Localized enlarged lymph nodes CPT copyright 2022 American Medical Association. All rights reserved. The codes documented in this report are preliminary and upon coder review may  be revised to meet current compliance requirements. Corliss Parish, MD 02/02/2023 2:20:09 PM Number of Addenda: 0

## 2023-02-02 NOTE — Transfer of Care (Signed)
Immediate Anesthesia Transfer of Care Note  Patient: Samuel Hubbard  Procedure(s) Performed: UPPER ENDOSCOPIC ULTRASOUND (EUS) RADIAL ESOPHAGOGASTRODUODENOSCOPY (EGD) WITH PROPOFOL  Patient Location: PACU and Endoscopy Unit  Anesthesia Type:MAC  Level of Consciousness: drowsy and patient cooperative  Airway & Oxygen Therapy: Patient Spontanous Breathing and Patient connected to nasal cannula oxygen  Post-op Assessment: Report given to RN and Post -op Vital signs reviewed and stable  Post vital signs: Reviewed and stable  Last Vitals:  Vitals Value Taken Time  BP    Temp    Pulse 83 02/02/23 1415  Resp 18 02/02/23 1415  SpO2 93 % 02/02/23 1415  Vitals shown include unfiled device data.  Last Pain:  Vitals:   02/02/23 1043  TempSrc: Temporal  PainSc: 10-Worst pain ever         Complications: No notable events documented.

## 2023-02-02 NOTE — Anesthesia Postprocedure Evaluation (Signed)
Anesthesia Post Note  Patient: Kordale Oba  Procedure(s) Performed: UPPER ENDOSCOPIC ULTRASOUND (EUS) RADIAL ESOPHAGOGASTRODUODENOSCOPY (EGD) WITH PROPOFOL     Patient location during evaluation: Endoscopy Anesthesia Type: MAC Level of consciousness: awake and alert Pain management: pain level controlled Vital Signs Assessment: post-procedure vital signs reviewed and stable Respiratory status: spontaneous breathing, nonlabored ventilation, respiratory function stable and patient connected to nasal cannula oxygen Cardiovascular status: blood pressure returned to baseline and stable Postop Assessment: no apparent nausea or vomiting Anesthetic complications: no   No notable events documented.  Last Vitals:  Vitals:   02/02/23 1440 02/02/23 1450  BP: (!) 156/80 (!) 167/83  Pulse: 72 69  Resp: 17 12  Temp:    SpO2: 97% 96%    Last Pain:  Vitals:   02/02/23 1450  TempSrc:   PainSc: 0-No pain                 Trevor Iha

## 2023-02-03 ENCOUNTER — Encounter: Payer: Self-pay | Admitting: Internal Medicine

## 2023-02-03 ENCOUNTER — Ambulatory Visit: Payer: Medicare Other | Attending: Internal Medicine | Admitting: Internal Medicine

## 2023-02-03 VITALS — BP 116/78 | HR 77 | Ht 72.0 in | Wt 235.8 lb

## 2023-02-03 DIAGNOSIS — I4891 Unspecified atrial fibrillation: Secondary | ICD-10-CM | POA: Diagnosis not present

## 2023-02-03 MED ORDER — AMLODIPINE BESYLATE 5 MG PO TABS: 5.0000 mg | ORAL_TABLET | Freq: Every day | ORAL | 3 refills | Status: DC
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
## 2023-02-03 NOTE — Patient Instructions (Signed)
Medication Instructions:  Your physician has recommended you make the following change in your medication:  Stop taking hydrochlorothiazide  Decrease Amlodipine from 10 mg to 5 mg once daily  Continue taking all other medications as prescribed  Labwork: None  Testing/Procedures: None  Follow-Up: Your physician recommends that you schedule a follow-up appointment in: 1 year. You will receive a reminder call in about months reminding you to schedule your appointment. If you don't receive this call, please contact our office.   Any Other Special Instructions Will Be Listed Below (If Applicable).  Thank you for choosing [facility]!      If you need a refill on your cardiac medications before your next appointment, please call your pharmacy.

## 2023-02-04 ENCOUNTER — Inpatient Hospital Stay (HOSPITAL_BASED_OUTPATIENT_CLINIC_OR_DEPARTMENT_OTHER): Payer: Medicare Other | Admitting: Hematology

## 2023-02-04 VITALS — BP 120/105 | HR 86 | Temp 97.3°F | Resp 18 | Ht 72.0 in | Wt 234.6 lb

## 2023-02-04 DIAGNOSIS — Z5111 Encounter for antineoplastic chemotherapy: Secondary | ICD-10-CM | POA: Diagnosis present

## 2023-02-04 DIAGNOSIS — Z803 Family history of malignant neoplasm of breast: Secondary | ICD-10-CM | POA: Diagnosis not present

## 2023-02-04 DIAGNOSIS — C155 Malignant neoplasm of lower third of esophagus: Secondary | ICD-10-CM

## 2023-02-04 DIAGNOSIS — Z801 Family history of malignant neoplasm of trachea, bronchus and lung: Secondary | ICD-10-CM | POA: Diagnosis not present

## 2023-02-04 DIAGNOSIS — Z79899 Other long term (current) drug therapy: Secondary | ICD-10-CM | POA: Diagnosis not present

## 2023-02-04 MED ORDER — HYDROCODONE-ACETAMINOPHEN 5-325 MG PO TABS: 1.0000 | ORAL_TABLET | Freq: Three times a day (TID) | ORAL | 0 refills | Status: DC | PRN
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
## 2023-02-04 NOTE — Progress Notes (Signed)
START ON PATHWAY REGIMEN - Gastroesophageal     A cycle is every 14 days:     Docetaxel      Oxaliplatin      Leucovorin      Fluorouracil   **Always confirm dose/schedule in your pharmacy ordering system**  Patient Characteristics: Esophageal & GE Junction, Adenocarcinoma, Preoperative or Nonsurgical Candidate, M0 (Clinical Staging), cT2 or Higher or cN+, Surgical Candidate (Up to cT4a), Esophageal Therapeutic Status: Preoperative or Nonsurgical Candidate, M0 (Clinical Staging) Histology: Adenocarcinoma Disease Classification: Esophageal AJCC Grade: G3 AJCC 8 Stage Grouping: III AJCC T Category: cT3 AJCC N Category: cN0 AJCC M Category: cM0 Intent of Therapy: Curative Intent, Discussed with Patient

## 2023-02-04 NOTE — Patient Instructions (Signed)
[facility] at [facility] Discharge Instructions   You were seen and examined today by Dr. Ellin Saba.  He reviewed the results of your PET scan that did not show any cancer besides the tumor in the esophagus.   This cancer is considered a Stage III cancer.   He discussed with you treatment with a chemotherapy regimen called FLOT. With this regimen, you receive 4 different drugs, 3 of which are chemotherapy drugs. The chemotherapy drugs are called Taxotere, oxaliplatin, and fluorouracil (5FU). There is another drug called leucovorin you will receive with treatments. This is a vitamin that is given along with chemo. It helps the 5FU work better. Treatment is given in the clinic every 2 weeks.   We will arrange for you to have a chemotherapy education class prior to starting treatment.   We will arrange for you to have a port a cath placed in order to administer chemotherapy. This will be done by interventional radiology in [place].   Return as scheduled.    Thank you for choosing [facility] at [facility] to provide your oncology and hematology care.  To afford each patient quality time with our provider, please arrive at least 15 minutes before your scheduled appointment time.   If you have a lab appointment with the Cancer Center please come in thru the Main Entrance and check in at the main information desk.  You need to re-schedule your appointment should you arrive 10 or more minutes late.  We strive to give you quality time with our providers, and arriving late affects you and other patients whose appointments are after yours.  Also, if you no show three or more times for appointments you may be dismissed from the clinic at the providers discretion.     Again, thank you for choosing [facility].  Our hope is that these requests will decrease the amount of time that you wait before being seen by our physicians.        _____________________________________________________________  Should you have questions after your visit to [facility], please contact our office at [phone] and follow the prompts.  Our office hours are 8:00 a.m. and 4:30 p.m. Monday - Friday.  Please note that voicemails left after 4:00 p.m. may not be returned until the following business day.  We are closed weekends and major holidays.  You do have access to a nurse 24-7, just call the main number to the clinic [phone] and do not press any options, hold on the line and a nurse will answer the phone.    For prescription refill requests, have your pharmacy contact our office and allow 72 hours.    Due to Covid, you will need to wear a mask upon entering the hospital. If you do not have a mask, a mask will be given to you at the Main Entrance upon arrival. For doctor visits, patients may have 1 support person age 32 or older with them. For treatment visits, patients can not have anyone with them due to social distancing guidelines and our immunocompromised population.

## 2023-02-05 ENCOUNTER — Other Ambulatory Visit: Payer: Self-pay

## 2023-02-05 ENCOUNTER — Encounter (HOSPITAL_COMMUNITY): Payer: Self-pay | Admitting: Gastroenterology

## 2023-02-07 ENCOUNTER — Other Ambulatory Visit: Payer: Self-pay | Admitting: Physician Assistant

## 2023-02-07 DIAGNOSIS — Z01818 Encounter for other preprocedural examination: Secondary | ICD-10-CM

## 2023-02-08 ENCOUNTER — Encounter (HOSPITAL_COMMUNITY): Payer: Self-pay
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
## 2023-02-09 ENCOUNTER — Other Ambulatory Visit: Payer: Self-pay

## 2023-02-09 ENCOUNTER — Ambulatory Visit (HOSPITAL_COMMUNITY)
Admission: RE | Admit: 2023-02-09 | Discharge: 2023-02-09 | Disposition: A | Discharge status: Home / Self Care | Payer: Medicare Other | Source: Ambulatory Visit | Attending: Hematology | Admitting: Hematology

## 2023-02-09 DIAGNOSIS — Z7901 Long term (current) use of anticoagulants: Secondary | ICD-10-CM | POA: Diagnosis not present

## 2023-02-09 DIAGNOSIS — Z01818 Encounter for other preprocedural examination: Secondary | ICD-10-CM

## 2023-02-09 DIAGNOSIS — C155 Malignant neoplasm of lower third of esophagus: Secondary | ICD-10-CM | POA: Diagnosis present

## 2023-02-09 DIAGNOSIS — E119 Type 2 diabetes mellitus without complications: Secondary | ICD-10-CM | POA: Diagnosis not present

## 2023-02-09 DIAGNOSIS — E039 Hypothyroidism, unspecified: Secondary | ICD-10-CM | POA: Diagnosis not present

## 2023-02-09 DIAGNOSIS — K219 Gastro-esophageal reflux disease without esophagitis: Secondary | ICD-10-CM | POA: Insufficient documentation

## 2023-02-09 DIAGNOSIS — M25562 Pain in left knee: Secondary | ICD-10-CM | POA: Insufficient documentation

## 2023-02-09 DIAGNOSIS — I4891 Unspecified atrial fibrillation: Secondary | ICD-10-CM | POA: Diagnosis not present

## 2023-02-09 DIAGNOSIS — I10 Essential (primary) hypertension: Secondary | ICD-10-CM | POA: Diagnosis not present

## 2023-02-09 HISTORY — PX: IR IMAGING GUIDED PORT INSERTION: IMG5740

## 2023-02-09 LAB — GLUCOSE, CAPILLARY
Glucose-Capillary: 210 mg/dL — ABNORMAL HIGH (ref 70–99)
Glucose-Capillary: 235 mg/dL — ABNORMAL HIGH (ref 70–99)

## 2023-02-09 MED ORDER — FENTANYL CITRATE (PF) 100 MCG/2ML IJ SOLN
INTRAMUSCULAR | Status: DC | PRN
  Administered 2023-02-09: 25 ug via INTRAVENOUS
  Administered 2023-02-09: 50 ug via INTRAVENOUS
  Administered 2023-02-09: 25 ug via INTRAVENOUS

## 2023-02-09 MED ORDER — FENTANYL CITRATE (PF) 100 MCG/2ML IJ SOLN
INTRAMUSCULAR | Status: AC
  Filled 2023-02-09: qty 4

## 2023-02-09 MED ORDER — MIDAZOLAM HCL 2 MG/2ML IJ SOLN
INTRAMUSCULAR | Status: AC
Start: 2023-02-09 — End: ?
  Filled 2023-02-09: qty 4

## 2023-02-09 MED ORDER — MIDAZOLAM HCL 2 MG/2ML IJ SOLN
INTRAMUSCULAR | Status: DC | PRN
  Administered 2023-02-09 (×2): 1 mg via INTRAVENOUS

## 2023-02-09 MED ORDER — LIDOCAINE-EPINEPHRINE 1 %-1:100000 IJ SOLN
INTRAMUSCULAR | Status: AC
  Filled 2023-02-09: qty 1

## 2023-02-09 MED ORDER — HEPARIN SOD (PORK) LOCK FLUSH 100 UNIT/ML IV SOLN
INTRAVENOUS | Status: AC
  Filled 2023-02-09: qty 5

## 2023-02-09 MED ORDER — LIDOCAINE-EPINEPHRINE 2 %-1:100000 IJ SOLN
20.0000 mL | Freq: Once | INTRAMUSCULAR | Status: DC
  Filled 2023-02-09: qty 20

## 2023-02-09 NOTE — Procedures (Signed)
  Procedure:  R internal jugular port catheter placement   Preprocedure diagnosis: Diagnoses of Malignant neoplasm of lower third of esophagus (HCC) and Pre-op exam were pertinent to this visit. Postprocedure diagnosis: same EBL:    minimal Complications:   none immediate  See full dictation in [employer].  Thora Lance MD Main # [phone] Pager  [phone] Mobile [phone]

## 2023-02-10 NOTE — Progress Notes (Signed)
Pharmacist Chemotherapy Monitoring - Initial Assessment    Anticipated start date: 02/16/23   The following has been reviewed per standard work regarding the patient's treatment regimen: The patient's diagnosis, treatment plan and drug doses, and organ/hematologic function Lab orders and baseline tests specific to treatment regimen  The treatment plan start date, drug sequencing, and pre-medications Prior authorization status  Patient's documented medication list, including drug-drug interaction screen and prescriptions for anti-emetics and supportive care specific to the treatment regimen The drug concentrations, fluid compatibility, administration routes, and timing of the medications to be used The patient's access for treatment and lifetime cumulative dose history, if applicable  The patient's medication allergies and previous infusion related reactions, if applicable   Changes made to treatment plan:  treatment plan date, pre-medications dexamethasone to push, and adjust appts to reflect only a 24 hour pump not 46 hour.  Moved Stimufend to day 2 with pump DC.  Follow up needed:  N/A   Stephens Shire, [facility], 02/10/2023  10:28 AM

## 2023-02-12 ENCOUNTER — Inpatient Hospital Stay: Payer: Medicare Other | Admitting: Licensed Clinical Social Worker

## 2023-02-12 ENCOUNTER — Inpatient Hospital Stay: Payer: Medicare Other

## 2023-02-12 DIAGNOSIS — C155 Malignant neoplasm of lower third of esophagus: Secondary | ICD-10-CM

## 2023-02-12 MED ORDER — PROCHLORPERAZINE MALEATE 10 MG PO TABS: 10.0000 mg | ORAL_TABLET | Freq: Four times a day (QID) | ORAL | 3 refills | Status: DC | PRN

## 2023-02-12 MED ORDER — LIDOCAINE-PRILOCAINE 2.5-2.5 % EX CREA: TOPICAL_CREAM | CUTANEOUS | 3 refills | Status: DC

## 2023-02-12 NOTE — Progress Notes (Signed)

## 2023-02-12 NOTE — Progress Notes (Signed)
[facility] CSW Progress Note  Clinical Child psychotherapist  assessed pt at a previous visit.  Appointment made in error.        Rachel Moulds, LCSW Clinical Social Worker [facility]

## 2023-02-12 NOTE — Patient Instructions (Signed)
[facility] Chemotherapy Teaching   You have been diagnosed with Stage III Esophageal Cancer.  You will come to the cancer center every 2 weeks to receive the chemotherapy called Docetaxel, Oxaliplatin, and Fluorouracil.  The intent of treatment is curative.   You will see the doctor regularly throughout treatment.  We will obtain blood work from you prior to every treatment and monitor your results to make sure it is safe to give your treatment. The doctor monitors your response to treatment by the way you are feeling, your blood work, and by obtaining scans periodically.  There will be wait times while you are here for treatment.  It will take about 30 minutes to 1 hour for your lab work to result.  Then there will be wait times while pharmacy mixes your medications.     Medications you will receive in the clinic prior to your chemotherapy medications:  Aloxi:  ALOXI is used in adults to help prevent nausea and vomiting that happens with certain chemotherapy drugs.  Aloxi is a long acting medication, and will remain in your system for about two days.   Dexamethasone:  This is a steroid given prior to chemotherapy to help prevent allergic reactions; it may also help prevent and control nausea and diarrhea.     Docetaxel (Taxotere)   About This Drug   Docetaxel is used to treat cancer. It is given in the vein (IV). It will take 1 hour to infuse.  The first infusion will take longer (about 1.5 hours) due to the fact that we start this drug at a very slow rate and gradually increase the rate of infusion until the maximum rate is reached.  This is done to monitor for infusion reactions, and to make sure you will tolerated this drug without adverse effects.  If you tolerate the first infusion, all subsequent infusions will be started at the normal rate and given over 1 hour.  Possible Side Effects    Bone marrow suppression. This is a decrease in the number of white blood cells, red  blood cells, and platelets. This may raise your risk of infection, make you tired and weak, and raise your risk of bleeding.    Neutropenic fever. A type of fever that can develop when you have a very low number of white blood cells which can be life-threatening.    Soreness of the mouth and throat. You may have red areas, white patches, or sores that hurt.    Nausea and vomiting (throwing up)    Constipation (not able to move bowels)    Diarrhea (loose bowel movements)    Infections    Fluid retention - swelling of the hands, feet, or any other part of the body. Fluid may build-up around your lungs and/or heart.    Changes in the way food and drinks taste    Effects on the nerves are called peripheral neuropathy. You may feel numbness, tingling, or pain in your hands and feet. It may be hard for you to button your clothes, open jars, or walk as usual. The effect on the nerves may get worse with more doses of the drug. These effects get better in some people after the drug is stopped but it does not get better in all people.    Decreased appetite (decreased hunger)    Weakness    Pain    Muscle pain/aching    Trouble breathing    Changes in your nail color, you may  have nail loss and/or brittle nail    Hair loss. Hair loss is often temporary, although there have been cases of permanent hair loss reported. Hair loss may happen suddenly or gradually. If you lose hair, you may lose it from your head, face, armpits, pubic area, chest, and/or legs. You may also notice your hair getting thin.    Allergic skin reaction. You may develop blisters on your skin that are filled with fluid or a severe red rash all over your body that may be painful.    Allergic reactions, including anaphylaxis are rare but may happen in some patients. Signs of allergic reaction to this drug may be swelling of the face, feeling like your tongue or throat are swelling, trouble breathing, rash, itching, fever,  chills, feeling dizzy, and/or feeling that your heart is beating in a fast or not normal way. If this happens, do not take another dose of this drug. You should get urgent medical treatment.   Note: Not all possible side effects are included above.   Warnings and Precautions    Severe bone marrow suppression, including febrile neutropenia which can be life-threatening    Severe allergic reactions, including anaphylaxis which can be life-threatening  Colitis, which is swelling (inflammation) in the colon - symptoms are diarrhea (loose bowel movements), stomach cramping, and sometimes blood in the bowel movements  Severe skin reactions, including redness, swelling, or peeling of skin  Severe swelling in the eye or other changes in eyesight    Severe fluid retention    If you have a history of abnormal liver function, receive high doses of docetaxel, or have a history of lung cancer and have received treatment with a platinum (type of chemotherapy medication), you have an increased risk of death.    Severe weakness    This drug may raise your risk of getting a second cancer such as leukemia, lymphoma, myelodysplastic syndrome, and kidney cancer.    Severe peripheral neuropathy    This drug contains alcohol and may affect your central nervous system. The central nervous system is made up of your brain and spinal cord. You may feel dizzy and very sleepy.    Tumor lysis syndrome: This drug may act on the cancer cells very quickly. This may affect how your kidneys work. Note: Some of the side effects above are very rare. If you have concerns and/or questions, please discuss them with your medical team. Important Information    This drug may be present in the saliva, tears, sweat, urine, stool, vomit, semen, and vaginal secretions. Talk to your doctor and/or your nurse about the necessary precautions to take during this time.    This drug may impair your ability to drive or use machinery. Use  caution and tell your nurse or doctor if you feel dizzy, very sleepy, and/or experience low blood pressure. Treating Side Effects    Manage tiredness by pacing your activities for the day.    Be sure to include periods of rest between energy-draining activities.    Get regular exercise. If you feel too tired to exercise vigorously, try taking a short walk.     To decrease the risk of infection, wash your hands regularly.    Avoid close contact with people who have a cold, the flu, or other infections.    Take your temperature as your doctor or nurse tells you, and whenever you feel like you may have a fever.    To help decrease the risk of bleeding,  use a soft toothbrush. Check with your nurse before using dental floss.    Be very careful when using knives or tools.    Use an electric shaver instead of a razor.    Mouth care is very important and will help food taste better and improve your appetite. Your mouth care should consist of routine, gentle cleaning of your teeth or dentures and rinsing your mouth with a mixture of 1/2 teaspoon of salt in 8 ounces of water or 1/2 teaspoon of baking soda in 8 ounces of water. This should be done at least after each meal and at bedtime.    If you have mouth sores, avoid mouthwash that has alcohol. Also avoid alcohol and smoking because they can bother your mouth and throat.    Taking good care of your mouth may help food taste better and improve your appetite    Drink plenty of fluids (a minimum of eight glasses per day is recommended).    If you throw up or have diarrhea, you should drink more fluids so that you do not become dehydrated (lack of water in the body from losing too much fluid).    If you have diarrhea, eat low-fiber foods that are high in protein and calories and avoid foods that can irritate your digestive tracts or lead to cramping.    If you are not able to move your bowels, check with your doctor or nurse before you use  enemas, laxatives, or suppositories.    Ask your doctor or nurse about medicines that are available to help stop or lessen constipation and/ or diarrhea.    To help with nausea and vomiting, eat small, frequent meals instead of three large meals a day. Choose foods and drinks that are at room temperature. Ask your nurse or doctor about other helpful tips and medicine that is available to help stop or lessen these symptoms.    To help with decreased appetite, eat foods high in calories and protein, such as meat, poultry, fish, dry beans, tofu, eggs, nuts, milk, yogurt, cheese, ice cream, pudding, and nutritional supplements.    Consider using sauces and spices to increase taste. Daily exercise, with your doctor's approval, may increase your appetite.    Keeping your pain under control is important to your well-being. Please tell your doctor or nurse if you are experiencing pain.    If you get a rash do not put anything on it unless your doctor or nurse says you may. Keep the area around the rash clean and dry. Ask your doctor for medicine if your rash bothers you.    Keeping your nails moisturized may help with brittleness.    To help with hair loss, wash with a mild shampoo and avoid washing your hair every day. Avoid coloring your hair.    Avoid rubbing your scalp, pat your hair or scalp dry.    Limit your use of hair spray, electric curlers, blow dryers, and curling irons.    If you are interested in getting a wig, talk to your nurse and they can help you get in touch with programs in your local area.    If you have numbness and tingling in your hands and feet, be careful when cooking, walking, and handling sharp objects and hot liquids.   Food and Drug Interactions    This drug may interact with grapefruit and grapefruit juice. Talk to your doctor as this could make side effects worse.    This drug  may interact with other medicines. Tell your doctor and pharmacist about all the  prescription and over-the-counter medicines and dietary supplements (vitamins, minerals, herbs, and others) that you are taking at this time. Also, check with your doctor or pharmacist before starting any new prescription or over-the-counter medicines, or dietary supplements to make sure that there are no interactions.    This drug may interact with St. John's Wort and may lower the levels of the drug in your body, which can make it less effective. When to Call the Doctor Call your doctor or nurse if you have any of these symptoms and/or any new or unusual symptoms:    Fever of 100.4 F (38 C) or higher    Chills    Blurred vision or other changes in eyesight, excessive tearing    Symptoms of being drunk, confusion, or being very sleepy    Feeling dizzy or lightheaded  Tiredness and/or weakness that interferes with your daily activities  Easy bruising or bleeding    Wheezing and/or trouble breathing    Chest pain    Pain in your mouth or throat that makes it hard to eat or drink    Nausea that stops you from eating or drinking and/or is not relieved by prescribed medicines    Throwing up more than 3 times a day    Lasting loss of appetite or rapid weight loss of five pounds in a week    Diarrhea, 4 times in one day or diarrhea with lack of strength or a feeling of being dizzy    No bowel movement in 3 days or when you feel uncomfortable    Pain in your abdomen that does not go away    Blood in your stool    Swelling of the hands, feet, or any other part of the body    Weight gain of 5 pounds in one week (fluid retention)    New rash and/or itching  Rash that is not relieved by prescribed medicines    Signs of inflammation/infection (redness, swelling, pain) of the tissue around your nails.    Signs of allergic reaction: swelling of the face, feeling like your tongue or throat are swelling, trouble breathing, rash, itching, fever, chills, feeling dizzy, and/or feeling that  your heart is beating in a fast or not normal way. If this happens, call 911 for emergency care.    Flu-like symptoms: fever, headache, muscle and joint aches, and fatigue (low energy, feeling weak)    Signs of possible liver problems: dark urine, pale bowel movements, pain in your abdomen, feeling very tired and weak, unusual itching, or yellowing of the eyes or skin    Numbness, tingling, or pain in your hands and feet    Signs of tumor lysis: confusion or agitation, decreased urine, nausea/vomiting, diarrhea, muscle cramping, numbness and/or tingling, seizures.    General pain that does not go away or is not relieved by prescribed medicine    If you think you may be pregnant or have impregnated your partner   Reproduction Warnings    Pregnancy warning: This drug can have harmful effects on the unborn baby. Women of childbearing potential should use effective methods of birth control during your cancer treatment and for 6 months after stopping treatment. Men with male partners of childbearing potential should use effective methods of birth control during your cancer treatment and for 3 months after stopping treatment. Let your doctor know right away if you think you may be pregnant  or may have impregnated your partner.    Breastfeeding warning: Women should not breastfeed during treatment and for 1 week after stopping treatment because this drug could enter the breast milk and cause harm to a breastfeeding baby.    Fertility warning: In men, this drug may affect your ability to have children in the future. Talk with your doctor or nurse if you plan to have children. Ask for information on sperm banking.    Oxaliplatin (Eloxatin)  About This Drug  Oxaliplatin is used to treat cancer. It is given in the vein (IV).  It takes two hours to infuse.  Possible Side Effects   Bone marrow suppression. This is a decrease in the number of white blood cells, red blood cells, and platelets.  This may raise your risk of infection, make you tired and weak (fatigue), and raise your risk of bleeding.   Tiredness   Soreness of the mouth and throat. You may have red areas, white patches, or sores that hurt.   Nausea and vomiting (throwing up)   Diarrhea (loose bowel movements)   Changes in your liver function   Effects on the nerves called peripheral neuropathy. You may feel numbness, tingling, or pain in your hands and feet, and may be worse in cold temperatures. It may be hard for you to button your clothes, open jars, or walk as usual. The effect on the nerves may get worse with more doses of the drug. These effects get better in some people after the drug is stopped but it does not get better in all people  Note: Each of the side effects above was reported in 40% or greater of patients treated with oxaliplatin. Not all possible side effects are included above.   Warnings and Precautions   Allergic reactions, including anaphylaxis, which may be life-threatening are rare but may happen in some patients. Signs of allergic reaction to this drug may be swelling of the face, feeling like your tongue or throat are swelling, trouble breathing, rash, itching, fever, chills, feeling dizzy, and/or feeling that your heart is beating in a fast or not normal way. If this happens, do not take another dose of this drug. You should get urgent medical treatment.   Inflammation (swelling) of the lungs, which may be life-threatening. You may have a dry cough or trouble breathing.   Effects on the nerves (neuropathy) may resolve within 14 days, or it may persist beyond 14 days.   Severe decrease in white blood cells when combined with the chemotherapy agents 5-fluorouracil and leucovorin. This may be life-threatening.   Severe changes in your liver function   Abnormal heart beat and/or EKG, which can be life-threatening   Rhabdomyolysis- damage to your muscles which may release proteins in your  blood and affect how your kidneys work, which can be life-threatening. You may have severe muscle weakness and/or pain, or dark urine.  Important Information   This drug may impair your ability to drive or use machinery. Talk to your doctor and/or nurse about precautions you may need to take.   This drug may be present in the saliva, tears, sweat, urine, stool, vomit, semen, and vaginal secretions. Talk to your doctor and/or your nurse about the necessary precautions to take during this time.  * The effects on the nerves can be aggravated by exposure to cold. Avoid cold beverages, use of ice and make sure you cover your skin and dress warmly prior to being exposed to cold temperatures while you  are receiving treatment with oxaliplatin*   Treating Side Effects   Manage tiredness by pacing your activities for the day.   Be sure to include periods of rest between energy-draining activities.   To decrease the risk of infection, wash your hands regularly.   Avoid close contact with people who have a cold, the flu, or other infections.  Take your temperature as your doctor or nurse tells you, and whenever you feel like you may have a fever.   To help decrease the risk of bleeding, use a soft toothbrush. Check with your nurse before using dental floss.   Be very careful when using knives or tools.   Use an electric shaver instead of a razor.   Drink plenty of fluids (a minimum of eight glasses per day is recommended).   Mouth care is very important. Your mouth care should consist of routine, gentle cleaning of your teeth or dentures and rinsing your mouth with a mixture of 1/2 teaspoon of salt in 8 ounces of water or 1/2 teaspoon of baking soda in 8 ounces of water. This should be done at least after each meal and at bedtime.   If you have mouth sores, avoid mouthwash that has alcohol. Also avoid alcohol and smoking because they can bother your mouth and throat.   To help with nausea and  vomiting, eat small, frequent meals instead of three large meals a day. Choose foods and drinks that are at room temperature. Ask your nurse or doctor about other helpful tips and medicine that is available to help stop or lessen these symptoms.   If you throw up or have loose bowel movements, you should drink more fluids so that you do not become dehydrated (lack of water in the body from losing too much fluid).   If you have diarrhea, eat low-fiber foods that are high in protein and calories and avoid foods that can irritate your digestive tracts or lead to cramping.   Ask your nurse or doctor about medicine that can lessen or stop your diarrhea.   If you have numbness and tingling in your hands and feet, be careful when cooking, walking, and handling sharp objects and hot liquids.   Do not drink cold drinks or use ice in beverages. Drink fluids at room temperature or warmer, and drink through a straw.   Wear gloves to touch cold objects, and wear warm clothing and cover you skin during cold weather.   Food and Drug Interactions   There are no known interactions of oxaliplatin with food and other medications.   This drug may interact with other medicines. Tell your doctor and pharmacist about all the prescription and over-the-counter medicines and dietary supplements (vitamins, minerals, herbs and others) that you are taking at this time. Also, check with your doctor or pharmacist before starting any new prescription or over-the-counter medicines, or dietary supplements to make sure that there are no interactions   When to Call the Doctor  Call your doctor or nurse if you have any of these symptoms and/or any new or unusual symptoms:   Fever of 100.4 F (38 C) or higher   Chills   Tiredness that interferes with your daily activities   Feeling dizzy or lightheaded   Easy bleeding or bruising   Feeling that your heart is beating in a fast or not normal way (palpitations)   Pain  in your chest   Dry cough   Trouble breathing   Pain in your mouth  or throat that makes it hard to eat or drink   Nausea that stops you from eating or drinking and/or is not relieved by prescribed medicines   Throwing up   Diarrhea, 4 times in one day or diarrhea with lack of strength or a feeling of being dizzy   Numbness, tingling, or pain in your hands and feet   Signs of possible liver problems: dark urine, pale bowel movements, bad stomach pain, feeling very tired and weak, unusual itching, or yellowing of the eyes or skin   Signs of rhabdomyolysis: decreased urine, very dark urine, muscle pain in the shoulders, thighs, or lower back; muscle weakness or trouble moving arms and legs   Signs of allergic reaction: swelling of the face, feeling like your tongue or throat are swelling, trouble breathing, rash, itching, fever, chills, feeling dizzy, and/or feeling that your heart is beating in a fast or not normal way. If this happens, call 911 for emergency care.   If you think you may be pregnant  Reproduction Warnings   Pregnancy warning: This drug may have harmful effects on the unborn baby. Women of childbearing potential should use effective methods of birth control during your cancer treatment. Let your doctor know right away if you think you may be pregnant or may have impregnated your partner.   Breastfeeding warning: It is not known if this drug passes into breast milk. For this reason, women should talk to their doctor about the risks and benefits of breastfeeding during treatment with this drug because this drug may enter the breast milk and cause harm to a breastfeeding baby.   Fertility warning: Human fertility studies have not been done with this drug. Talk with your doctor or nurse if you plan to have children. Ask for information on sperm or egg banking.   Leucovorin Calcium  About This Drug  Leucovorin is a vitamin. It is used in combination with other cancer  fighting drugs such as 5-fluorouracil and methotrexate. Leucovorin is given in the vein (IV).  This drug runs at the same time as the oxaliplatin and takes 2 hours to infuse.   Possible Side Effects  Rash and itching  Note: Leucovorin by itself has very few side effects. Other side effects you may have can be caused by the other drugs you are taking, such as 5-fluorouracil.   Warnings and Precautions   Allergic reactions, including anaphylaxis are rare but may happen in some patients. Signs of allergic reaction to this drug may be swelling of the face, feeling like your tongue or throat are swelling, trouble breathing, rash, itching, fever, chills, feeling dizzy, and/or feeling that your heart is beating in a fast or not normal way. If this happens, do not take another dose of this drug. You should get urgent medical treatment.  Food and Drug Interactions   There are no known interactions of leucovorin with food.   This drug may interact with other medicines. Tell your doctor and pharmacist about all the prescription and over-the-counter medicines and dietary supplements (vitamins, minerals, herbs and others) that you are taking at this time.   Also, check with your doctor or pharmacist before starting any new prescription or over-the-counter medicines, or dietary supplements to make sure that there are no interactions.   When to Call the Doctor  Call your doctor or nurse if you have any of these symptoms and/or any new or unusual symptoms:   A new rash or a rash that is not relieved by  prescribed medicines   Signs of allergic reaction: swelling of the face, feeling like your tongue or throat are swelling, trouble breathing, rash, itching, fever, chills, feeling dizzy, and/or feeling that your heart is beating in a fast or not normal way. If this happens, call 911 for emergency care.   If you think you may be pregnant   Reproduction Warnings   Pregnancy warning: It is not known if  this drug may harm an unborn child. For this reason, be sure to talk with your doctor if you are pregnant or planning to become pregnant while receiving this drug. Let your doctor know right away if you think you may be pregnant   Breastfeeding warning: It is not known if this drug passes into breast milk. For this reason, women should talk to their doctor about the risks and benefits of breastfeeding during treatment with this drug because this drug may enter the breast milk and cause harm to a breastfeeding baby.   Fertility warning: Human fertility studies have not been done with this drug. Talk with your doctor or nurse if you plan to have children. Ask for information on sperm or egg banking.   5-Fluorouracil (Adrucil; 5FU)  About This Drug  Fluorouracil is used to treat cancer. It is given in the vein (IV). It is given as an IV push from a syringe and also as a continuous infusion given via an ambulatory pump (a pump you take home and wear for a specified amount of time).  Possible Side Effects   Bone marrow suppression. This is a decrease in the number of white blood cells, red blood cells, and platelets. This may raise your risk of infection, make you tired and weak (fatigue), and raise your risk of bleeding   Changes in the tissue of the heart and/or heart attack. Some changes may happen that can cause your heart to have less ability to pump blood.   Blurred vision or other changes in eyesight   Nausea and throwing up (vomiting)   Diarrhea (loose bowel movements)   Ulcers - sores that may cause pain or bleeding in your digestive tract, which includes your mouth, esophagus, stomach, small/large intestines and rectum   Soreness of the mouth and throat. You may have red areas, white patches, or sores that hurt.   Allergic reactions, including anaphylaxis are rare but may happen in some patients. Signs of allergic reaction to this drug may be swelling of the face, feeling like your  tongue or throat are swelling, trouble breathing, rash, itching, fever, chills, feeling dizzy, and/or feeling that your heart is beating in a fast or not normal way. If this happens, do not take another dose of this drug. You should get urgent medical treatment.   Sensitivity to light (photosensitivity). Photosensitivity means that you may become more sensitive to the sun and/or light. You may get a skin rash/reaction if you are in the sun or are exposed to sun lamps and tanning beds. Your eyes may water more, mostly in bright light.   Changes in your nail color, nail loss and/or brittle nail   Darkening of the skin, or changes to the color of your skin and/or veins used for infusion   Rash, dry skin, or itching  Note: Not all possible side effects are included above.  Warnings and Precautions   Hand-and-foot syndrome. The palms of your hands or soles of your feet may tingle, become numb, painful, swollen, or red.   Changes in your central  nervous system can happen. The central nervous system is made up of your brain and spinal cord. You could feel extreme tiredness, agitation, confusion, hallucinations (see or hear things that are not there), trouble understanding or speaking, loss of control of your bowels or bladder, eyesight changes, numbness or lack of strength to your arms, legs, face, or body, or coma. If you start to have any of these symptoms let your doctor know right away.   Side effects of this drug may be unexpectedly severe in some patients  Note: Some of the side effects above are very rare. If you have concerns and/or questions, please discuss them with your medical team.   Important Information   This drug may be present in the saliva, tears, sweat, urine, stool, vomit, semen, and vaginal secretions. Talk to your doctor and/or your nurse about the necessary precautions to take during this time.   Treating Side Effects   Manage tiredness by pacing your activities for the  day.   Be sure to include periods of rest between energy-draining activities.   To help decrease the risk of infections, wash your hands regularly.   Avoid close contact with people who have a cold, the flu, or other infections.   Take your temperature as your doctor or nurse tells you, and whenever you feel like you may have a fever.   Use a soft toothbrush. Check with your nurse before using dental floss.   Be very careful when using knives or tools.   Use an electric shaver instead of a razor.   If you have a nose bleed, sit with your head tipped slightly forward. Apply pressure by lightly pinching the bridge of your nose between your thumb and forefinger. Call your doctor if you feel dizzy or faint or if the bleeding doesn't stop after 10 to 15 minutes.   Drink plenty of fluids (a minimum of eight glasses per day is recommended).   If you throw up or have loose bowel movements, you should drink more fluids so that you do not  become dehydrated (lack of water in the body from losing too much fluid).   To help with nausea and vomiting, eat small, frequent meals instead of three large meals a day. Choose foods and drinks that are at room temperature. Ask your nurse or doctor about other helpful tips and medicine that is available to help, stop, or lessen these symptoms.   If you have diarrhea, eat low-fiber foods that are high in protein and calories and avoid foods that can irritate your digestive tracts or lead to cramping.   Ask your nurse or doctor about medicine that can lessen or stop your diarrhea.   Mouth care is very important. Your mouth care should consist of routine, gentle cleaning of your teeth or dentures and rinsing your mouth with a mixture of 1/2 teaspoon of salt in 8 ounces of water or 1/2 teaspoon of baking soda in 8 ounces of water. This should be done at least after each meal and at bedtime.   If you have mouth sores, avoid mouthwash that has alcohol. Also avoid  alcohol and smoking because they can bother your mouth and throat.   Keeping your nails moisturized may help with brittleness.   To help with itching, moisturize your skin several times day.   Use sunscreen with SPF 30 or higher when you are outdoors even for a short time. Cover up when you are out in the sun. Wear wide-brimmed hats,  long-sleeved shirts, and pants. Keep your neck, chest, and back covered. Wear dark sun glasses when in the sun or bright lights.   If you get a rash do not put anything on it unless your doctor or nurse says you may. Keep the area around the rash clean and dry. Ask your doctor for medicine if your rash bothers you.   Keeping your pain under control is important to your well-being. Please tell your doctor or nurse if you are experiencing pain.   Food and Drug Interactions   There are no known interactions of fluorouracil with food.   Check with your doctor or pharmacist about all other prescription medicines and over-the-counter medicines and dietary supplements (vitamins, minerals, herbs and others) you are taking before starting this medicine as there are known drug interactions with 5-fluoroucacil. Also, check with your doctor or pharmacist before starting any new prescription or over-the-counter medicines, or dietary supplements to make sure that there are no interactions.  When to Call the Doctor  Call your doctor or nurse if you have any of these symptoms and/or any new or unusual symptoms:   Fever of 100.4 F (38 C) or higher   Chills   Easy bleeding or bruising   Nose bleed that doesn't stop bleeding after 10-15 minutes   Trouble breathing   Feeling dizzy or lightheaded   Feeling that your heart is beating in a fast or not normal way (palpitations)   Chest pain or symptoms of a heart attack. Most heart attacks involve pain in the center of the chest that lasts more than a few minutes. The pain may go away and come back or it can be constant.  It can feel like pressure, squeezing, fullness, or pain. Sometimes pain is felt in one or both arms, the back, neck, jaw, or stomach. If any of these symptoms last 2 minutes, call 911.   Confusion and/or agitation   Hallucinations   Trouble understanding or speaking   Loss of control of bowels or bladder   Blurry vision or changes in your eyesight   Headache that does not go away   Numbness or lack of strength to your arms, legs, face, or body   Nausea that stops you from eating or drinking and/or is not relieved by prescribed medicines   Throwing up more than 3 times a day   Diarrhea, 4 times in one day or diarrhea with lack of strength or a feeling of being dizzy   Pain in your mouth or throat that makes it hard to eat or drink   Pain along the digestive tract - especially if worse after eating   Blood in your vomit (bright red or coffee-ground) and/or stools (bright red, or black/tarry)   Coughing up blood   Tiredness that interferes with your daily activities   Painful, red, or swollen areas on your hands or feet or around your nails   A new rash or a rash that is not relieved by prescribed medicines   Develop sensitivity to sunlight/light   Numbness and/or tingling of your hands and/or feet   Signs of allergic reaction: swelling of the face, feeling like your tongue or throat are swelling, trouble breathing, rash, itching, fever, chills, feeling dizzy, and/or feeling that your heart is beating in a fast or not normal way. If this happens, call 911 for emergency care.   If you think you are pregnant or may have impregnated your partner  Reproduction Warnings   Pregnancy warning:  This drug may have harmful effects on the unborn baby. Women of child bearing potential should use effective methods of birth control during your cancer treatment and 3 months after treatment. Men with male partners of childbearing potential should use effective methods of birth control  during your cancer treatment and for 3 months after your cancer treatment. Let your doctor know right away if you think you may be pregnant or may have impregnated your partner.   Breastfeeding warning: It is not known if this drug passes into breast milk. For this reason, Women should not breastfeed during treatment because this drug could enter the breast milk and cause harm to a breastfeeding baby.   Fertility warning: In men and women both, this drug may affect your ability to have children in the future. Talk with your doctor or nurse if you plan to have children. Ask for information on sperm or egg banking.    SELF CARE ACTIVITIES WHILE ON CHEMOTHERAPY/IMMUNOTHERAPY:  Hydration Increase your fluid intake 48 hours prior to treatment and drink at least 8 to 12 cups (64 ounces) of water/decaffeinated beverages per day after treatment. You can still have your cup of coffee or soda but these beverages do not count as part of your 8 to 12 cups that you need to drink daily. No alcohol intake.  Medications Continue taking your normal prescription medication as prescribed.  If you start any new herbal or new supplements please let us know first to make sure it is safe.  Mouth Care Have teeth cleaned professionally before starting treatment. Keep dentures and partial plates clean. Use soft toothbrush and do not use mouthwashes that contain alcohol. Biotene is a good mouthwash that is available at most pharmacies or may be ordered by calling [phone]. Use warm salt water gargles (1 teaspoon salt per 1 quart warm water) before and after meals and at bedtime. Or you may rinse with 2 tablespoons of three-percent hydrogen peroxide mixed in eight ounces of water. If you are still having problems with your mouth or sores in your mouth please call the clinic. If you need dental work, please let the doctor know before you go for your appointment so that we can coordinate the best possible time for you in  regards to your chemo regimen. You need to also let your dentist know that you are actively taking chemo. We may need to do labs prior to your dental appointment.  Skin Care Always use sunscreen that has not expired and with SPF (Sun Protection Factor) of 50 or higher. Wear hats to protect your head from the sun. Remember to use sunscreen on your hands, ears, face, & feet.  Use good moisturizing lotions such as udder cream, eucerin, or even Vaseline. Some chemotherapies can cause dry skin, color changes in your skin and nails.    Avoid long, hot showers or baths. Use gentle, fragrance-free soaps and laundry detergent. Use moisturizers, preferably creams or ointments rather than lotions because the thicker consistency is better at preventing skin dehydration. Apply the cream or ointment within 15 minutes of showering. Reapply moisturizer at night, and moisturize your hands every time after you wash them.   Infection Prevention Please wash your hands for at least 30 seconds using warm soapy water. Handwashing is the #1 way to prevent the spread of germs. Stay away from sick people or people who are getting over a cold. If you develop respiratory systems such as green/yellow mucus production or productive cough or persistent cough  let us know and we will see if you need an antibiotic. It is a good idea to keep a pair of gloves on when going into grocery stores/[employer] to decrease your risk of coming into contact with germs on the carts, etc. Carry alcohol hand gel with you at all times and use it frequently if out in public. If your temperature reaches 100.5 or higher please call the clinic and let us know.  If it is after hours or on the weekend please go to the ER if your temperature is over 100.4.  Please have your own personal thermometer at home to use.    Sex and bodily fluids If you are going to have sex, a condom must be used to protect the person that isn't taking immunotherapy. For a few days  after treatment, immunotherapy can be excreted through your bodily fluids.  When using the toilet please close the lid and flush the toilet twice.  Do this for a few day after you have had immunotherapy.   Contraception It is not known for sure whether or not immunotherapy drugs can be passed on through semen or secretions from the vagina. Because of this some doctors advise people to use a barrier method if you have sex during treatment. This applies to vaginal, anal or oral sex.  Generally, doctors advise a barrier method only for the time you are actually having the treatment and for about a week after your treatment.  Advice like this can be worrying, but this does not mean that you have to avoid being intimate with your partner. You can still have close contact with your partner and continue to enjoy sex.  Animals If you have cats or birds we just ask that you not change the litter or change the cage.  Please have someone else do this for you while you are on immunotherapy.   Food Safety During and After Cancer Treatment Food safety is important for people both during and after cancer treatment. Cancer and cancer treatments, such as chemotherapy, radiation therapy, and stem cell/bone marrow transplantation, often weaken the immune system. This makes it harder for your body to protect itself from foodborne illness, also called food poisoning. Foodborne illness is caused by eating food that contains harmful bacteria, parasites, or viruses.  Foods to avoid Some foods have a higher risk of becoming tainted with bacteria. These include: Unwashed fresh fruit and vegetables, especially leafy vegetables that can hide dirt and other contaminants Raw sprouts, such as alfalfa sprouts Raw or undercooked beef, especially ground beef, or other raw or undercooked meat and poultry Fatty, fried, or spicy foods immediately before or after treatment.  These can sit heavy on your stomach and make you feel  nauseous. Raw or undercooked shellfish, such as oysters. Sushi and sashimi, which often contain raw fish.  Unpasteurized beverages, such as unpasteurized fruit juices, raw milk, raw yogurt, or cider Undercooked eggs, such as soft boiled, over easy, and poached; raw, unpasteurized eggs; or foods made with raw egg, such as homemade raw cookie dough and homemade mayonnaise  Simple steps for food safety  Shop smart. Do not buy food stored or displayed in an unclean area. Do not buy bruised or damaged fruits or vegetables. Do not buy cans that have cracks, dents, or bulges. Pick up foods that can spoil at the end of your shopping trip and store them in a cooler on the way home.  Prepare and clean up foods carefully. Rinse all fresh fruits  and vegetables under running water, and dry them with a clean towel or paper towel. Clean the top of cans before opening them. After preparing food, wash your hands for 20 seconds with hot water and soap. Pay special attention to areas between fingers and under nails. Clean your utensils and dishes with hot water and soap. Disinfect your kitchen and cutting boards using 1 teaspoon of liquid, unscented bleach mixed into 1 quart of water.    Dispose of old food. Eat canned and packaged food before its expiration date (the "use by" or "best before" date). Consume refrigerated leftovers within 3 to 4 days. After that time, throw out the food. Even if the food does not smell or look spoiled, it still may be unsafe. Some bacteria, such as Listeria, can grow even on foods stored in the refrigerator if they are kept for too long.  Take precautions when eating out. At restaurants, avoid buffets and salad bars where food sits out for a long time and comes in contact with many people. Food can become contaminated when someone with a virus, often a norovirus, or another "bug" handles it. Put any leftover food in a "to-go" container yourself, rather than having the server  do it. And, refrigerate leftovers as soon as you get home. Choose restaurants that are clean and that are willing to prepare your food as you order it cooked.    SYMPTOMS TO REPORT AS SOON AS POSSIBLE AFTER TREATMENT:  FEVER GREATER THAN 100.4 F CHILLS WITH OR WITHOUT FEVER NAUSEA AND VOMITING THAT IS NOT CONTROLLED WITH YOUR NAUSEA MEDICATION UNUSUAL SHORTNESS OF BREATH UNUSUAL BRUISING OR BLEEDING TENDERNESS IN MOUTH AND THROAT WITH OR WITHOUT PRESENCE OF ULCERS URINARY PROBLEMS BOWEL PROBLEMS UNUSUAL RASH     Wear comfortable clothing and clothing appropriate for easy access to any Portacath or PICC line. Let us know if there is anything that we can do to make your therapy better!   What to do if you need assistance after hours or on the weekends: CALL [phone].  HOLD on the line, do not hang up.  You will hear multiple messages but at the end you will be connected with a nurse triage line.  They will contact the doctor if necessary.  Most of the time they will be able to assist you.  Do not call the hospital operator.    I have been informed and understand all of the instructions given to me and have received a copy. I have been instructed to call the clinic [phone] or my family physician as soon as possible for continued medical care, if indicated. I do not have any more questions at this time but understand that I may call the Cancer Center or the Patient Navigator at [phone] during office hours should I have questions or need assistance in obtaining follow-up care.

## 2023-02-13 ENCOUNTER — Encounter: Payer: Self-pay | Admitting: Licensed Clinical Social Worker

## 2023-02-13 ENCOUNTER — Telehealth: Payer: Self-pay | Admitting: Licensed Clinical Social Worker

## 2023-02-13 ENCOUNTER — Encounter: Payer: Self-pay | Admitting: Hematology

## 2023-02-13 ENCOUNTER — Ambulatory Visit: Payer: Self-pay | Admitting: Licensed Clinical Social Worker

## 2023-02-13 DIAGNOSIS — Z1379 Encounter for other screening for genetic and chromosomal anomalies: Secondary | ICD-10-CM | POA: Insufficient documentation

## 2023-02-13 NOTE — Telephone Encounter (Signed)
I contacted Mr. Turpen to via MyChart message to discuss his genetic testing results. No pathogenic variants were identified in the 70 genes analyzed. Detailed clinic note to follow.   The test report has been scanned into EPIC and is located under the Molecular Pathology section of the Results Review tab.  A portion of the result report is included below for reference.      Lacy Duverney, MS, [facility] Genetic Counselor [email or web address] Phone: [phone]

## 2023-02-13 NOTE — Progress Notes (Signed)
HPI:   Samuel Hubbard was previously seen in the [facility] due to a personal and family history of cancer and concerns regarding a hereditary predisposition to cancer. Please refer to our prior cancer genetics clinic note for more information regarding our discussion, assessment and recommendations, at the time. Samuel Hubbard recent genetic test results were disclosed to him, as were recommendations warranted by these results. These results and recommendations are discussed in more detail below.  CANCER HISTORY:  Oncology History  Malignant neoplasm of lower third of esophagus (HCC)  01/01/2023 Initial Diagnosis   Malignant neoplasm of lower third of esophagus (HCC)   01/01/2023 Pathology Results   FINAL MICROSCOPIC DIAGNOSIS:   A. STOMACH, BIOPSY:  -  Antral and oxyntic mucosa with no significant pathology.  -  No Helicobacter pylori organisms identified on HE stained slide.   B. STOMACH, POLYPECTOMY:  -  Fundic gland polyp.   C. ESOPHAGEAL MASS, BIOPSY:  -  Poorly differentiated adenocarcinoma with mucinous and signet ring  cell features.    01/05/2023 Imaging   CT CAP: IMPRESSION: 1. Substantial irregular wall thickening of the distal half of the thoracic esophagus extending over a 8.5 cm length, compatible with tumor. This corresponds with the mass found at endoscopy. 2. No findings of metastatic disease in the chest, abdomen, or pelvis. 3. Coronary, aortic, and branch vessel atherosclerotic vascular disease. 4. Nonobstructive 10 mm left kidney lower pole calculus. 5. Sigmoid colon diverticulosis. 6. Mild prostatomegaly. 7. Lower lumbar impingement at L3-4, L4-5, and L5-S1.   02/04/2023 Cancer Staging   Staging form: Esophagus - Adenocarcinoma, AJCC 8th Edition - Clinical stage from 02/04/2023: Stage III (cT3, cN0, cM0, G3) - Signed by [name], MD on 02/04/2023 Histopathologic type: Adenocarcinoma, NOS Stage prefix: Initial diagnosis Total  positive nodes: 0 Histologic grading system: 3 grade system HER2 status: Negative   02/16/2023 -  Chemotherapy   Patient is on Treatment Plan : GASTROESOPHAGEAL FLOT q14d X 4 cycles      Genetic Testing   No pathogenic variants identified on the [facility] Multi-Cancer+RNA panel. VUS in APC called [id number] and VUS In RET called c.202C>A identified. The report date is 02/09/2023.  The Multi-Cancer + RNA Panel offered by [facility] includes sequencing and/or deletion/duplication analysis of the following 70 genes:  AIP*, ALK, APC*, ATM*, AXIN2*, BAP1*, BARD1*, BLM*, BMPR1A*, BRCA1*, BRCA2*, BRIP1*, CDC73*, CDH1*, CDK4, CDKN1B*, CDKN2A, CHEK2*, CTNNA1*, DICER1*, EPCAM, EGFR, FH*, FLCN*, GREM1, HOXB13, KIT, LZTR1, MAX*, MBD4, MEN1*, MET, MITF, MLH1*, MSH2*, MSH3*, MSH6*, MUTYH*, NF1*, NF2*, NTHL1*, PALB2*, PDGFRA, PMS2*, POLD1*, POLE*, POT1*, PRKAR1A*, PTCH1*, PTEN*, RAD51C*, RAD51D*, RB1*, RET, SDHA*, SDHAF2*, SDHB*, SDHC*, SDHD*, SMAD4*, SMARCA4*, SMARCB1*, SMARCE1*, STK11*, SUFU*, TMEM127*, TP53*, TSC1*, TSC2*, VHL*. RNA analysis is performed for * genes.     FAMILY HISTORY:  We obtained a detailed, 4-generation family history.  Significant diagnoses are listed below: Family History  Problem Relation Age of Onset   Breast cancer Mother 56   Heart disease Father    Lung cancer Father    Colon cancer Neg Hx    Prostate cancer Neg Hx     Samuel Hubbard has 1 sister, 26, no cancers.   Samuel Hubbard mother had breast cancer at 71 and is living at 20. Limited information about this side of the family, no other known cancers.   Samuel Hubbard father had lung cancer and passed at 53. Limited information about this side of the family, no other known cancers.   Samuel Hubbard is unaware  of previous family history of genetic testing for hereditary cancer risks. There is no reported Ashkenazi Jewish ancestry. There is no known consanguinity.     GENETIC TEST RESULTS:  The [facility]  Multi-Cancer+RNA Panel found no pathogenic mutations.   The Multi-Cancer + RNA Panel offered by [facility] includes sequencing and/or deletion/duplication analysis of the following 70 genes:  AIP*, ALK, APC*, ATM*, AXIN2*, BAP1*, BARD1*, BLM*, BMPR1A*, BRCA1*, BRCA2*, BRIP1*, CDC73*, CDH1*, CDK4, CDKN1B*, CDKN2A, CHEK2*, CTNNA1*, DICER1*, EPCAM, EGFR, FH*, FLCN*, GREM1, HOXB13, KIT, LZTR1, MAX*, MBD4, MEN1*, MET, MITF, MLH1*, MSH2*, MSH3*, MSH6*, MUTYH*, NF1*, NF2*, NTHL1*, PALB2*, PDGFRA, PMS2*, POLD1*, POLE*, POT1*, PRKAR1A*, PTCH1*, PTEN*, RAD51C*, RAD51D*, RB1*, RET, SDHA*, SDHAF2*, SDHB*, SDHC*, SDHD*, SMAD4*, SMARCA4*, SMARCB1*, SMARCE1*, STK11*, SUFU*, TMEM127*, TP53*, TSC1*, TSC2*, VHL*. RNA analysis is performed for * genes.   The test report has been scanned into EPIC and is located under the Molecular Pathology section of the Results Review tab.  A portion of the result report is included below for reference. Genetic testing reported out on 02/09/2023.      Genetic testing identified a variant of uncertain significance (VUS) in the APC and RET genes.  At this time, it is unknown if this variant is associated with an increased risk for cancer or if it is benign, but most uncertain variants are reclassified to benign. It should not be used to make medical management decisions. With time, we suspect the laboratory will determine the significance of this variant, if any. If the laboratory reclassifies this variant, we will attempt to contact Samuel Hubbard to discuss it further.   Even though a pathogenic variant was not identified, possible explanations for the cancer in the family may include: There may be no hereditary risk for cancer in the family. The cancers in Samuel Hubbard and/or his family may be sporadic/familial or due to other genetic and environmental factors. There may be a gene mutation in one of these genes that current testing methods cannot detect but that chance is small. There could  be another gene that has not yet been discovered, or that we have not yet tested, that is responsible for the cancer diagnoses in the family.  It is also possible there is a hereditary cause for the cancer in the family that Samuel Hubbard did not inherit.  Therefore, it is important to remain in touch with cancer genetics in the future so that we can continue to offer Samuel Hubbard the most up to date genetic testing.   ADDITIONAL GENETIC TESTING:  We discussed with Samuel Hubbard that his genetic testing was fairly extensive.  If there are additional relevant genes identified to increase cancer risk that can be analyzed in the future, we would be happy to discuss and coordinate this testing at that time.    CANCER SCREENING RECOMMENDATIONS:  Samuel Hubbard test result is considered negative (normal).  This means that we have not identified a hereditary cause for his personal and family history of cancer at this time.   An individual's cancer risk and medical management are not determined by genetic test results alone. Overall cancer risk assessment incorporates additional factors, including personal medical history, family history, and any available genetic information that may result in a personalized plan for cancer prevention and surveillance. Therefore, it is recommended he continue to follow the cancer management and screening guidelines provided by his oncology and primary healthcare provider.  RECOMMENDATIONS FOR FAMILY MEMBERS:   Since he did not inherit a identifiable mutation in a cancer  predisposition gene included on this panel, his children could not have inherited a known mutation from him in one of these genes. Individuals in this family might be at some increased risk of developing cancer, over the general population risk, due to the family history of cancer.  Individuals in the family should notify their providers of the family history of cancer. We recommend women in this family have a  yearly mammogram beginning at age 1, or 42 years younger than the earliest onset of cancer, an annual clinical breast exam, and perform monthly breast self-exams.  Family members should have colonoscopies by at age 51, or earlier, as recommended by their providers. Other members of the family may still carry a pathogenic variant in one of these genes that Samuel Hubbard did not inherit. Based on the family history, we recommend his mother, who was diagnosed with breast cancer at age 68, have genetic counseling and testing. Samuel Hubbard will let us know if we can be of any assistance in coordinating genetic counseling and/or testing for this family member.   We do not recommend familial testing for the RET/APC variant of uncertain significance (VUS).  FOLLOW-UP:  Lastly, we discussed with Samuel Hubbard that cancer genetics is a rapidly advancing field and it is possible that new genetic tests will be appropriate for him and/or his family members in the future. We encouraged him to remain in contact with cancer genetics on an annual basis so we can update his personal and family histories and let him know of advances in cancer genetics that may benefit this family.   Our contact number was provided. Samuel Hubbard questions were answered to his satisfaction, and he knows he is welcome to call us at anytime with additional questions or concerns.    Lacy Duverney, MS, [facility] Genetic Counselor [email or web address] Phone: [phone]

## 2023-02-15 ENCOUNTER — Encounter: Payer: Self-pay | Admitting: Licensed Clinical Social Worker

## 2023-02-16 ENCOUNTER — Inpatient Hospital Stay (HOSPITAL_BASED_OUTPATIENT_CLINIC_OR_DEPARTMENT_OTHER): Payer: Medicare Other | Admitting: Hematology

## 2023-02-16 ENCOUNTER — Inpatient Hospital Stay: Payer: Medicare Other

## 2023-02-16 ENCOUNTER — Inpatient Hospital Stay: Payer: Medicare Other | Admitting: Dietician

## 2023-02-16 VITALS — BP 127/67 | HR 65 | Temp 97.2°F | Resp 16

## 2023-02-16 DIAGNOSIS — C155 Malignant neoplasm of lower third of esophagus: Secondary | ICD-10-CM

## 2023-02-16 DIAGNOSIS — R739 Hyperglycemia, unspecified: Secondary | ICD-10-CM

## 2023-02-16 DIAGNOSIS — Z5111 Encounter for antineoplastic chemotherapy: Secondary | ICD-10-CM | POA: Diagnosis not present

## 2023-02-16 LAB — CBC WITH DIFFERENTIAL/PLATELET
Abs Immature Granulocytes: 0.03 10*3/uL (ref 0.00–0.07)
Basophils Absolute: 0 10*3/uL (ref 0.0–0.1)
Basophils Relative: 1 %
Eosinophils Absolute: 0.1 10*3/uL (ref 0.0–0.5)
Eosinophils Relative: 1 %
HCT: 40 % (ref 39.0–52.0)
Hemoglobin: 12.9 g/dL — ABNORMAL LOW (ref 13.0–17.0)
Immature Granulocytes: 0 %
Lymphocytes Relative: 27 %
Lymphs Abs: 1.9 10*3/uL (ref 0.7–4.0)
MCH: 28.2 pg (ref 26.0–34.0)
MCHC: 32.3 g/dL (ref 30.0–36.0)
MCV: 87.3 fL (ref 80.0–100.0)
Monocytes Absolute: 0.7 10*3/uL (ref 0.1–1.0)
Monocytes Relative: 9 %
Neutro Abs: 4.3 10*3/uL (ref 1.7–7.7)
Neutrophils Relative %: 62 %
Platelets: 226 10*3/uL (ref 150–400)
RBC: 4.58 MIL/uL (ref 4.22–5.81)
RDW: 12.1 % (ref 11.5–15.5)
WBC: 7 10*3/uL (ref 4.0–10.5)
nRBC: 0 % (ref 0.0–0.2)

## 2023-02-16 LAB — COMPREHENSIVE METABOLIC PANEL
ALT: 23 U/L (ref 0–44)
AST: 11 U/L — ABNORMAL LOW (ref 15–41)
Albumin: 3.2 g/dL — ABNORMAL LOW (ref 3.5–5.0)
Alkaline Phosphatase: 75 U/L (ref 38–126)
Anion gap: 7 (ref 5–15)
BUN: 22 mg/dL (ref 8–23)
CO2: 27 mmol/L (ref 22–32)
Calcium: 8.8 mg/dL — ABNORMAL LOW (ref 8.9–10.3)
Chloride: 101 mmol/L (ref 98–111)
Creatinine, Ser: 0.75 mg/dL (ref 0.61–1.24)
GFR, Estimated: >60 mL/min (ref >60)
Glucose, Bld: 254 mg/dL — ABNORMAL HIGH (ref 70–99)
Potassium: 3.9 mmol/L (ref 3.5–5.1)
Sodium: 135 mmol/L (ref 135–145)
Total Bilirubin: 0.4 mg/dL (ref <1.2)
Total Protein: 6.4 g/dL — ABNORMAL LOW (ref 6.5–8.1)

## 2023-02-16 LAB — MAGNESIUM: Magnesium: 1.7 mg/dL (ref 1.7–2.4)

## 2023-02-16 MED ORDER — PALONOSETRON HCL INJECTION 0.25 MG/5ML
0.2500 mg | Freq: Once | INTRAVENOUS | Status: AC
  Administered 2023-02-16: 0.25 mg via INTRAVENOUS
  Filled 2023-02-16: qty 5

## 2023-02-16 MED ORDER — OXALIPLATIN CHEMO INJECTION 100 MG/20ML
85.0000 mg/m2 | Freq: Once | INTRAVENOUS | Status: AC
  Administered 2023-02-16: 200 mg via INTRAVENOUS
  Filled 2023-02-16: qty 40

## 2023-02-16 MED ORDER — SODIUM CHLORIDE 0.9 % IV SOLN
2600.0000 mg/m2 | INTRAVENOUS | Status: DC
  Administered 2023-02-16: 6050 mg via INTRAVENOUS
  Filled 2023-02-16: qty 121

## 2023-02-16 MED ORDER — INSULIN ASPART 100 UNIT/ML IJ SOLN
10.0000 [IU] | Freq: Once | INTRAMUSCULAR | Status: AC
Start: 2023-02-16 — End: 2023-02-16
  Administered 2023-02-16: 10 [IU] via INTRAVENOUS
  Filled 2023-02-16: qty 0.1

## 2023-02-16 MED ORDER — DEXAMETHASONE SODIUM PHOSPHATE 10 MG/ML IJ SOLN
10.0000 mg | Freq: Once | INTRAMUSCULAR | Status: AC
  Administered 2023-02-16: 10 mg via INTRAVENOUS
  Filled 2023-02-16: qty 1

## 2023-02-16 MED ORDER — DEXTROSE 5 % IV SOLN
200.0000 mg/m2 | Freq: Once | INTRAVENOUS | Status: AC
  Administered 2023-02-16: 464 mg via INTRAVENOUS
  Filled 2023-02-16: qty 23.2

## 2023-02-16 MED ORDER — DEXTROSE 5 % IV SOLN
INTRAVENOUS | Status: DC
Start: 2023-02-16 — End: 2023-02-16

## 2023-02-16 MED ORDER — SODIUM CHLORIDE 0.9 % IV SOLN
50.0000 mg/m2 | Freq: Once | INTRAVENOUS | Status: AC
  Administered 2023-02-16: 116 mg via INTRAVENOUS
  Filled 2023-02-16: qty 11.6

## 2023-02-16 NOTE — Progress Notes (Signed)
Nutrition Follow-up:  Pt with newly diagnosed malignant neoplasm of lower third of esophagus. He is receiving FLOT q14d (first 12/30). Surgery consult planned 1/3. Patient is under the care of Dr. Ellin Saba.   Met with patient and wife in infusion. Pt reports appetite has been good. He is figuring out which foods work best, but overall tolerating regular diet. Patient reports milk is easier to drink than water and room temperature works better than cold for fluids. Last night pt had chicken meatloaf, potatoes, beans. He ate a bowl of ice cream before bed. Patient reports he needs to work on his protein intake. Wife has purchased premier protein, but patient has not tried these yet.   Medications: reviewed   Labs: glucose 254, albumin 3.2  Anthropometrics: Last wt 240 lb on 12/23  11/21 - 241 lb 9.6 oz  Estimated Energy Needs  Kcals: 2700-3000 Protein: 130-153 Fluid: 3 L  NUTRITION DIAGNOSIS: Food and nutrition related knowledge deficit improving    INTERVENTION:  Reviewed protein foods, pt will include protein source at every meal Recommend 1-2 protein shakes (samples of Ensure Max provided)    MONITORING, EVALUATION, GOAL: wt trends, intake, adjuvant surgery plans   NEXT VISIT: Monday January 13 during infusion
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
## 2023-02-16 NOTE — Progress Notes (Signed)
Labs reviewed with MD today at office visit. Ok to treat today per MD.  Ambulatory pump video and information reviewed with patient and his wife.   Treatment given per orders. Patient tolerated it well without problems. Vitals stable and discharged home from clinic ambulatory. Follow up as scheduled.

## 2023-02-16 NOTE — Patient Instructions (Signed)

## 2023-02-16 NOTE — Patient Instructions (Signed)
[facility] [place] - A DEPT OF [facility]  Discharge Instructions: Thank you for choosing [facility] to provide your oncology and hematology care.  If you have a lab appointment with the Cancer Center - please note that after April 8th, 2024, all labs will be drawn in the cancer center.  You do not have to check in or register with the main entrance as you have in the past but will complete your check-in in the cancer center.  Wear comfortable clothing and clothing appropriate for easy access to any Portacath or PICC line.   We strive to give you quality time with your provider. You may need to reschedule your appointment if you arrive late (15 or more minutes).  Arriving late affects you and other patients whose appointments are after yours.  Also, if you miss three or more appointments without notifying the office, you may be dismissed from the clinic at the provider's discretion.      For prescription refill requests, have your pharmacy contact our office and allow 72 hours for refills to be completed.    Today you received the following chemotherapy and/or immunotherapy agents Docetaxol, oxaliplatin, leucovorin, adrucil   To help prevent nausea and vomiting after your treatment, we encourage you to take your nausea medication as directed.  BELOW ARE SYMPTOMS THAT SHOULD BE REPORTED IMMEDIATELY: *FEVER GREATER THAN 100.4 F (38 C) OR HIGHER *CHILLS OR SWEATING *NAUSEA AND VOMITING THAT IS NOT CONTROLLED WITH YOUR NAUSEA MEDICATION *UNUSUAL SHORTNESS OF BREATH *UNUSUAL BRUISING OR BLEEDING *URINARY PROBLEMS (pain or burning when urinating, or frequent urination) *BOWEL PROBLEMS (unusual diarrhea, constipation, pain near the anus) TENDERNESS IN MOUTH AND THROAT WITH OR WITHOUT PRESENCE OF ULCERS (sore throat, sores in mouth, or a toothache) UNUSUAL RASH, SWELLING OR PAIN  UNUSUAL VAGINAL DISCHARGE OR ITCHING   Items with * indicate a potential  emergency and should be followed up as soon as possible or go to the Emergency Department if any problems should occur.  Please show the CHEMOTHERAPY ALERT CARD or IMMUNOTHERAPY ALERT CARD at check-in to the Emergency Department and triage nurse.  Should you have questions after your visit or need to cancel or reschedule your appointment, please contact [facility] [place] - A DEPT OF [facility] [phone]  and follow the prompts.  Office hours are 8:00 a.m. to 4:30 p.m. Monday - Friday. Please note that voicemails left after 4:00 p.m. may not be returned until the following business day.  We are closed weekends and major holidays. You have access to a nurse at all times for urgent questions. Please call the main number to the clinic [phone] and follow the prompts.  For any non-urgent questions, you may also contact your provider using MyChart. We now offer e-Visits for anyone 23 and older to request care online for non-urgent symptoms. For details visit [email or web address].   Also download the MyChart app! Go to the app store, search "MyChart", open the app, select [facility], and log in with your MyChart username and password.
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
## 2023-02-16 NOTE — Progress Notes (Signed)
Patient has been examined by Dr. Katragadda. Vital signs and labs have been reviewed by MD - ANC, Creatinine, LFTs, hemoglobin, and platelets are within treatment parameters per M.D. - pt may proceed with treatment.  Primary RN and pharmacy notified.  

## 2023-02-17 ENCOUNTER — Inpatient Hospital Stay: Payer: Medicare Other

## 2023-02-17 VITALS — BP 137/73 | HR 79 | Temp 96.7°F | Resp 20

## 2023-02-17 DIAGNOSIS — C155 Malignant neoplasm of lower third of esophagus: Secondary | ICD-10-CM

## 2023-02-17 DIAGNOSIS — Z5111 Encounter for antineoplastic chemotherapy: Secondary | ICD-10-CM | POA: Diagnosis not present

## 2023-02-17 MED ORDER — SODIUM CHLORIDE 0.9% FLUSH
10.0000 mL | INTRAVENOUS | Status: DC | PRN
  Administered 2023-02-17: 10 mL

## 2023-02-17 MED ORDER — PEGFILGRASTIM-FPGK 6 MG/0.6ML ~~LOC~~ SOSY
6.0000 mg | PREFILLED_SYRINGE | Freq: Once | SUBCUTANEOUS | Status: AC
  Administered 2023-02-17: 6 mg via SUBCUTANEOUS
  Filled 2023-02-17: qty 0.6

## 2023-02-17 MED ORDER — HEPARIN SOD (PORK) LOCK FLUSH 100 UNIT/ML IV SOLN
500.0000 [IU] | Freq: Once | INTRAVENOUS | Status: AC | PRN
  Administered 2023-02-17: 500 [IU]

## 2023-02-17 NOTE — Patient Instructions (Signed)
 [facility] [place] - A DEPT OF [facility]  Discharge Instructions: Thank you for choosing [facility] to provide your oncology and hematology care.  If you have a lab appointment with the Cancer Center - please note that after April 8th, 2024, all labs will be drawn in the cancer center.  You do not have to check in or register with the main entrance as you have in the past but will complete your check-in in the cancer center.  Wear comfortable clothing and clothing appropriate for easy access to any Portacath or PICC line.   We strive to give you quality time with your provider. You may need to reschedule your appointment if you arrive late (15 or more minutes).  Arriving late affects you and other patients whose appointments are after yours.  Also, if you miss three or more appointments without notifying the office, you may be dismissed from the clinic at the provider's discretion.      For prescription refill requests, have your pharmacy contact our office and allow 72 hours for refills to be completed.    Today you received pump disconnection and Stimufend  injection   BELOW ARE SYMPTOMS THAT SHOULD BE REPORTED IMMEDIATELY: *FEVER GREATER THAN 100.4 F (38 C) OR HIGHER *CHILLS OR SWEATING *NAUSEA AND VOMITING THAT IS NOT CONTROLLED WITH YOUR NAUSEA MEDICATION *UNUSUAL SHORTNESS OF BREATH *UNUSUAL BRUISING OR BLEEDING *URINARY PROBLEMS (pain or burning when urinating, or frequent urination) *BOWEL PROBLEMS (unusual diarrhea, constipation, pain near the anus) TENDERNESS IN MOUTH AND THROAT WITH OR WITHOUT PRESENCE OF ULCERS (sore throat, sores in mouth, or a toothache) UNUSUAL RASH, SWELLING OR PAIN  UNUSUAL VAGINAL DISCHARGE OR ITCHING   Items with * indicate a potential emergency and should be followed up as soon as possible or go to the Emergency Department if any problems should occur.  Please show the CHEMOTHERAPY ALERT CARD or IMMUNOTHERAPY  ALERT CARD at check-in to the Emergency Department and triage nurse.  Should you have questions after your visit or need to cancel or reschedule your appointment, please contact [facility] [place] - A DEPT OF [facility] [phone]  and follow the prompts.  Office hours are 8:00 a.m. to 4:30 p.m. Monday - Friday. Please note that voicemails left after 4:00 p.m. may not be returned until the following business day.  We are closed weekends and major holidays. You have access to a nurse at all times for urgent questions. Please call the main number to the clinic [phone] and follow the prompts.  For any non-urgent questions, you may also contact your provider using MyChart. We now offer e-Visits for anyone 70 and older to request care online for non-urgent symptoms. For details visit [email or web address].   Also download the MyChart app! Go to the app store, search MyChart, open the app, select [place], and log in with your MyChart username and password.

## 2023-02-17 NOTE — Progress Notes (Signed)
 Patient's presents today for 5FU chemotherapy pump disconnection and Stimufend  injection per provider's order. Vital signs stable and port flushed easily with 10 mL normal saline and 5 mL of heparin . Good blood return noted and needle removed intact. No swelling or bruising noted at the site.  Discharged from clinic ambulatory in stable condition. Alert and oriented x 3. F/U with [facility] as scheduled.

## 2023-02-18 ENCOUNTER — Inpatient Hospital Stay: Payer: Medicare Other

## 2023-02-19 ENCOUNTER — Inpatient Hospital Stay: Payer: Medicare Other | Attending: Hematology

## 2023-02-19 ENCOUNTER — Inpatient Hospital Stay: Payer: Medicare Other

## 2023-02-19 ENCOUNTER — Other Ambulatory Visit (HOSPITAL_COMMUNITY): Payer: Self-pay | Admitting: Hematology

## 2023-02-19 ENCOUNTER — Telehealth: Payer: Self-pay | Admitting: *Deleted

## 2023-02-19 DIAGNOSIS — E119 Type 2 diabetes mellitus without complications: Secondary | ICD-10-CM | POA: Insufficient documentation

## 2023-02-19 DIAGNOSIS — I10 Essential (primary) hypertension: Secondary | ICD-10-CM | POA: Insufficient documentation

## 2023-02-19 DIAGNOSIS — Z5189 Encounter for other specified aftercare: Secondary | ICD-10-CM | POA: Diagnosis not present

## 2023-02-19 DIAGNOSIS — Z79899 Other long term (current) drug therapy: Secondary | ICD-10-CM | POA: Diagnosis not present

## 2023-02-19 DIAGNOSIS — M25562 Pain in left knee: Secondary | ICD-10-CM | POA: Diagnosis not present

## 2023-02-19 DIAGNOSIS — C155 Malignant neoplasm of lower third of esophagus: Secondary | ICD-10-CM

## 2023-02-19 DIAGNOSIS — Z5111 Encounter for antineoplastic chemotherapy: Secondary | ICD-10-CM | POA: Diagnosis present

## 2023-02-19 DIAGNOSIS — Z7984 Long term (current) use of oral hypoglycemic drugs: Secondary | ICD-10-CM | POA: Diagnosis not present

## 2023-02-19 LAB — CBC WITH DIFFERENTIAL/PLATELET
Band Neutrophils: 5 %
Basophils Absolute: 0.2 10*3/uL — ABNORMAL HIGH (ref 0.0–0.1)
Basophils Relative: 1 %
Blasts: 0 %
Eosinophils Absolute: 0 10*3/uL (ref 0.0–0.5)
Eosinophils Relative: 0 %
HCT: 44.6 % (ref 39.0–52.0)
Hemoglobin: 14.4 g/dL (ref 13.0–17.0)
Immature Granulocytes: 0 %
Lymphocytes Relative: 2 %
Lymphs Abs: 0.5 10*3/uL — ABNORMAL LOW (ref 0.7–4.0)
MCH: 27.6 pg (ref 26.0–34.0)
MCHC: 32.3 g/dL (ref 30.0–36.0)
MCV: 85.6 fL (ref 80.0–100.0)
Metamyelocytes Relative: 2 %
Monocytes Absolute: 0.7 10*3/uL (ref 0.1–1.0)
Monocytes Relative: 3 %
Myelocytes: 0 %
Neutro Abs: 21.5 10*3/uL — ABNORMAL HIGH (ref 1.7–7.7)
Neutrophils Relative %: 87 %
Platelets: 181 10*3/uL (ref 150–400)
Promyelocytes Relative: 0 %
RBC: 5.21 MIL/uL (ref 4.22–5.81)
RDW: 11.9 % (ref 11.5–15.5)
WBC: 23.4 10*3/uL — ABNORMAL HIGH (ref 4.0–10.5)
nRBC: 0 % (ref 0.0–0.2)

## 2023-02-19 LAB — COMPREHENSIVE METABOLIC PANEL
ALT: 17 U/L (ref 0–44)
AST: 15 U/L (ref 15–41)
Albumin: 3.2 g/dL — ABNORMAL LOW (ref 3.5–5.0)
Alkaline Phosphatase: 98 U/L (ref 38–126)
Anion gap: 14 (ref 5–15)
BUN: 21 mg/dL (ref 8–23)
CO2: 21 mmol/L — ABNORMAL LOW (ref 22–32)
Calcium: 8.9 mg/dL (ref 8.9–10.3)
Chloride: 97 mmol/L — ABNORMAL LOW (ref 98–111)
Creatinine, Ser: 0.85 mg/dL (ref 0.61–1.24)
GFR, Estimated: >60 mL/min (ref >60)
Glucose, Bld: 294 mg/dL — ABNORMAL HIGH (ref 70–99)
Potassium: 3.3 mmol/L — ABNORMAL LOW (ref 3.5–5.1)
Sodium: 132 mmol/L — ABNORMAL LOW (ref 135–145)
Total Bilirubin: 1.2 mg/dL (ref 0.0–1.2)
Total Protein: 6.4 g/dL — ABNORMAL LOW (ref 6.5–8.1)

## 2023-02-19 LAB — MAGNESIUM: Magnesium: 1.7 mg/dL (ref 1.7–2.4)

## 2023-02-19 MED ORDER — SODIUM CHLORIDE 0.9% FLUSH
10.0000 mL | Freq: Once | INTRAVENOUS | Status: AC | PRN
Start: 2023-02-19 — End: 2023-02-19
  Administered 2023-02-19: 10 mL

## 2023-02-19 MED ORDER — HEPARIN SOD (PORK) LOCK FLUSH 100 UNIT/ML IV SOLN
500.0000 [IU] | Freq: Once | INTRAVENOUS | Status: AC | PRN
  Administered 2023-02-19: 500 [IU]

## 2023-02-19 MED ORDER — POTASSIUM CHLORIDE IN NACL 20-0.9 MEQ/L-% IV SOLN
Freq: Once | INTRAVENOUS | Status: AC
  Filled 2023-02-19: qty 1000

## 2023-02-19 MED ORDER — MAGNESIUM SULFATE 2 GM/50ML IV SOLN
2.0000 g | Freq: Once | INTRAVENOUS | Status: AC
  Administered 2023-02-19: 2 g via INTRAVENOUS
  Filled 2023-02-19: qty 50

## 2023-02-19 MED ORDER — ONDANSETRON HCL 4 MG/2ML IJ SOLN
8.0000 mg | Freq: Once | INTRAMUSCULAR | Status: AC
  Administered 2023-02-19: 8 mg via INTRAVENOUS
  Filled 2023-02-19: qty 4

## 2023-02-19 NOTE — Telephone Encounter (Signed)
 Patient called and has been vomiting x 2 days. Unable to keep anything down and is weak and dizzy.  Will bring in for PF/Lab and fluids today.  DtEllin Saba aware.

## 2023-02-19 NOTE — Patient Instructions (Signed)
 You were here today for nausea/dehydration.  We gave you fluids with potassium and magnesium .  We also gave you some Zofran  for nausea.    Take your nausea medication (prochlorpirazine) around the clock for the next few days until we get your nausea under control.   You will return tomorrow for more fluids and medications.  Because of your dizziness, low blood pressure and dehydration - Dr. Rogers wants you to stop taking your olmasartan (Benicar ) and your Amlodipine  (Norvasc ).  Stop those until he tells you to start them back.  ** Please continue taking your Metoprolol  as prescribed.   We want you having a bowel movement regularly.  This can be contributing to your abdominal pain.  Start taking a stool softener once per day.  If this doesn't work, you can increase it to twice daily.    Increase your food intake as tolerated.

## 2023-02-19 NOTE — Progress Notes (Addendum)
 Patient is here today for fluids and nausea medication.  He has arrived and reports that he has been vomiting and had stomach pains for the last 2 days.  He had his chemo on Monday.  He reports that his home nausea medications aren't working to control the vomiting.  Orders received from provider for labs and medications.  Wife is at chairside with him.  Patient reports dizziness when lying flat. Describes it as the entire room is spinning and it makes him nauseated.  BP is soft.  Provider aware.  Orders for patient to stop taking his Benicar  and his Norvasc  until further instruction.  Patient does need to continue taking his metoprolol .   Patient reports that his bowels have not been moving much and he did have a BM today while here in clinic which gave him some pain relief.  Dr. Rogers wants patient taking stool softeners to lessen his chance of constipation.   Patient has been educated on stool softener regimen and provided a copy of instructions.  He was advised of how to take his nausea medication at home.  Patient and wife are in agreeance and all questions have been answered.  Patient will return tomorrow for house fluids and antiemetics.  Wife and patient aware of appointment time.  Patient discharged via wheelchair to wife in stable condition.

## 2023-02-20 ENCOUNTER — Encounter: Payer: Medicare Other | Admitting: Thoracic Surgery (Cardiothoracic Vascular Surgery)

## 2023-02-20 ENCOUNTER — Inpatient Hospital Stay: Payer: Medicare Other

## 2023-02-20 VITALS — BP 131/82 | HR 77 | Temp 96.8°F | Resp 18

## 2023-02-20 DIAGNOSIS — Z5111 Encounter for antineoplastic chemotherapy: Secondary | ICD-10-CM | POA: Diagnosis not present

## 2023-02-20 DIAGNOSIS — C155 Malignant neoplasm of lower third of esophagus: Secondary | ICD-10-CM

## 2023-02-20 MED ORDER — ONDANSETRON HCL 4 MG/2ML IJ SOLN
8.0000 mg | Freq: Once | INTRAMUSCULAR | Status: AC
Start: 2023-02-20 — End: 2023-02-20
  Administered 2023-02-20: 8 mg via INTRAVENOUS
  Filled 2023-02-20: qty 4

## 2023-02-20 MED ORDER — HEPARIN SOD (PORK) LOCK FLUSH 100 UNIT/ML IV SOLN
500.0000 [IU] | Freq: Once | INTRAVENOUS | Status: AC | PRN
  Administered 2023-02-20: 500 [IU]

## 2023-02-20 MED ORDER — MAGNESIUM SULFATE 2 GM/50ML IV SOLN
2.0000 g | Freq: Once | INTRAVENOUS | Status: AC
  Administered 2023-02-20: 2 g via INTRAVENOUS
  Filled 2023-02-20: qty 50

## 2023-02-20 MED ORDER — POTASSIUM CHLORIDE IN NACL 20-0.9 MEQ/L-% IV SOLN
Freq: Once | INTRAVENOUS | Status: AC
Start: 2023-02-20 — End: 2023-02-20
  Filled 2023-02-20: qty 1000

## 2023-02-20 NOTE — Progress Notes (Signed)
 Patient tolerated potassium and magnesium  fluids  with no complaints voiced.  Zofran  IV given per orders. Port site clean and dry with no bruising or swelling noted at site.  Good blood return noted before and after administration of infusion.  Band aid applied.  Patient left in satisfactory condition with VSS and no s/s of distress noted. Pt in ambulatory condition with no complaints or pain upon discharge.   Samuel Hubbard

## 2023-02-24 ENCOUNTER — Ambulatory Visit: Payer: Medicare Other | Admitting: Hematology

## 2023-02-24 ENCOUNTER — Ambulatory Visit: Payer: Medicare Other

## 2023-02-24 ENCOUNTER — Other Ambulatory Visit: Payer: Medicare Other

## 2023-02-27 ENCOUNTER — Other Ambulatory Visit: Payer: Self-pay

## 2023-02-27 MED ORDER — HYDROCODONE-ACETAMINOPHEN 5-325 MG PO TABS: 1.0000 | ORAL_TABLET | Freq: Three times a day (TID) | ORAL | 0 refills | Status: DC | PRN

## 2023-03-02 ENCOUNTER — Ambulatory Visit: Payer: Medicare Other

## 2023-03-02 ENCOUNTER — Inpatient Hospital Stay (HOSPITAL_BASED_OUTPATIENT_CLINIC_OR_DEPARTMENT_OTHER): Payer: Medicare Other | Admitting: Hematology

## 2023-03-02 ENCOUNTER — Inpatient Hospital Stay: Payer: Medicare Other

## 2023-03-02 ENCOUNTER — Inpatient Hospital Stay: Payer: Medicare Other | Admitting: Dietician

## 2023-03-02 ENCOUNTER — Inpatient Hospital Stay: Payer: Medicare Other | Admitting: Hematology

## 2023-03-02 VITALS — BP 147/92 | HR 78 | Temp 98.1°F | Resp 18 | Wt 232.6 lb

## 2023-03-02 VITALS — BP 145/81 | HR 61 | Temp 97.5°F | Resp 18

## 2023-03-02 DIAGNOSIS — C155 Malignant neoplasm of lower third of esophagus: Secondary | ICD-10-CM

## 2023-03-02 DIAGNOSIS — Z5111 Encounter for antineoplastic chemotherapy: Secondary | ICD-10-CM | POA: Diagnosis not present

## 2023-03-02 LAB — COMPREHENSIVE METABOLIC PANEL
ALT: 29 U/L (ref 0–44)
AST: 15 U/L (ref 15–41)
Albumin: 3.1 g/dL — ABNORMAL LOW (ref 3.5–5.0)
Alkaline Phosphatase: 98 U/L (ref 38–126)
Anion gap: 5 (ref 5–15)
BUN: 17 mg/dL (ref 8–23)
CO2: 27 mmol/L (ref 22–32)
Calcium: 8.4 mg/dL — ABNORMAL LOW (ref 8.9–10.3)
Chloride: 100 mmol/L (ref 98–111)
Creatinine, Ser: 0.69 mg/dL (ref 0.61–1.24)
GFR, Estimated: >60 mL/min (ref >60)
Glucose, Bld: 240 mg/dL — ABNORMAL HIGH (ref 70–99)
Potassium: 3.4 mmol/L — ABNORMAL LOW (ref 3.5–5.1)
Sodium: 132 mmol/L — ABNORMAL LOW (ref 135–145)
Total Bilirubin: 0.2 mg/dL (ref 0.0–1.2)
Total Protein: 6.1 g/dL — ABNORMAL LOW (ref 6.5–8.1)

## 2023-03-02 LAB — CBC WITH DIFFERENTIAL/PLATELET
Abs Immature Granulocytes: 0.34 10*3/uL — ABNORMAL HIGH (ref 0.00–0.07)
Basophils Absolute: 0.1 10*3/uL (ref 0.0–0.1)
Basophils Relative: 1 %
Eosinophils Absolute: 0.1 10*3/uL (ref 0.0–0.5)
Eosinophils Relative: 1 %
HCT: 37.3 % — ABNORMAL LOW (ref 39.0–52.0)
Hemoglobin: 11.9 g/dL — ABNORMAL LOW (ref 13.0–17.0)
Immature Granulocytes: 3 %
Lymphocytes Relative: 17 %
Lymphs Abs: 2 10*3/uL (ref 0.7–4.0)
MCH: 28.3 pg (ref 26.0–34.0)
MCHC: 31.9 g/dL (ref 30.0–36.0)
MCV: 88.6 fL (ref 80.0–100.0)
Monocytes Absolute: 0.5 10*3/uL (ref 0.1–1.0)
Monocytes Relative: 5 %
Neutro Abs: 8.7 10*3/uL — ABNORMAL HIGH (ref 1.7–7.7)
Neutrophils Relative %: 73 %
Platelets: 154 10*3/uL (ref 150–400)
RBC: 4.21 MIL/uL — ABNORMAL LOW (ref 4.22–5.81)
RDW: 12.9 % (ref 11.5–15.5)
WBC: 11.7 10*3/uL — ABNORMAL HIGH (ref 4.0–10.5)
nRBC: 0 % (ref 0.0–0.2)

## 2023-03-02 LAB — MAGNESIUM: Magnesium: 1.7 mg/dL (ref 1.7–2.4)

## 2023-03-02 MED ORDER — PALONOSETRON HCL INJECTION 0.25 MG/5ML
0.2500 mg | Freq: Once | INTRAVENOUS | Status: AC
  Administered 2023-03-02: 0.25 mg via INTRAVENOUS
  Filled 2023-03-02: qty 5

## 2023-03-02 MED ORDER — INSULIN ASPART 100 UNIT/ML IJ SOLN
10.0000 [IU] | Freq: Once | INTRAMUSCULAR | Status: AC
  Administered 2023-03-02: 10 [IU] via SUBCUTANEOUS
  Filled 2023-03-02: qty 0.1

## 2023-03-02 MED ORDER — LEUCOVORIN CALCIUM INJECTION 350 MG
200.0000 mg/m2 | Freq: Once | INTRAVENOUS | Status: AC
  Administered 2023-03-02: 464 mg via INTRAVENOUS
  Filled 2023-03-02: qty 23.2

## 2023-03-02 MED ORDER — POTASSIUM CHLORIDE CRYS ER 20 MEQ PO TBCR
40.0000 meq | EXTENDED_RELEASE_TABLET | Freq: Once | ORAL | Status: AC
  Administered 2023-03-02: 40 meq via ORAL
  Filled 2023-03-02: qty 2

## 2023-03-02 MED ORDER — DEXAMETHASONE SODIUM PHOSPHATE 10 MG/ML IJ SOLN
10.0000 mg | Freq: Once | INTRAMUSCULAR | Status: AC
  Administered 2023-03-02: 10 mg via INTRAVENOUS
  Filled 2023-03-02: qty 1

## 2023-03-02 MED ORDER — FOSAPREPITANT DIMEGLUMINE INJECTION 150 MG
150.0000 mg | Freq: Once | INTRAVENOUS | Status: AC
  Administered 2023-03-02: 150 mg via INTRAVENOUS
  Filled 2023-03-02: qty 150

## 2023-03-02 MED ORDER — SODIUM CHLORIDE 0.9 % IV SOLN
50.0000 mg/m2 | Freq: Once | INTRAVENOUS | Status: AC
  Administered 2023-03-02: 116 mg via INTRAVENOUS
  Filled 2023-03-02: qty 11.6

## 2023-03-02 MED ORDER — DEXTROSE 5 % IV SOLN: INTRAVENOUS | Status: DC

## 2023-03-02 MED ORDER — SODIUM CHLORIDE 0.9 % IV SOLN: INTRAVENOUS | Status: DC

## 2023-03-02 MED ORDER — OXALIPLATIN CHEMO INJECTION 100 MG/20ML
85.0000 mg/m2 | Freq: Once | INTRAVENOUS | Status: AC
  Administered 2023-03-02: 200 mg via INTRAVENOUS
  Filled 2023-03-02: qty 40

## 2023-03-02 MED ORDER — SODIUM CHLORIDE 0.9 % IV SOLN
2600.0000 mg/m2 | INTRAVENOUS | Status: DC
  Administered 2023-03-02: 6050 mg via INTRAVENOUS
  Filled 2023-03-02: qty 121

## 2023-03-02 NOTE — Patient Instructions (Signed)

## 2023-03-02 NOTE — Progress Notes (Signed)
 Patient presents today for Docetaxel /Folfox infusion. Patient is in satisfactory condition with no new complaints voiced.  Vital signs are stable. Labs reviewed by Dr. Rogers during the office visit and all labs are within treatment parameters. Patient will receive 10 unit of Novolog  insulin  and 40 mEq potassium chloride  p.o x 1 dose per Dr.K's standing orders. We will proceed with treatment per MD orders.   Treatment given today per MD orders. Tolerated infusion without adverse affects. Vital signs stable. No complaints at this time. Discharged from clinic ambulatory in stable condition. Alert and oriented x 3. F/U with [facility] as scheduled. 5FU ambulatory pump infusing with no alarms beeping.

## 2023-03-02 NOTE — Patient Instructions (Signed)
 [facility] [place] - A DEPT OF [facility]  Discharge Instructions: Thank you for choosing [facility] to provide your oncology and hematology care.  If you have a lab appointment with the Cancer Center - please note that after April 8th, 2024, all labs will be drawn in the cancer center.  You do not have to check in or register with the main entrance as you have in the past but will complete your check-in in the cancer center.  Wear comfortable clothing and clothing appropriate for easy access to any Portacath or PICC line.   We strive to give you quality time with your provider. You may need to reschedule your appointment if you arrive late (15 or more minutes).  Arriving late affects you and other patients whose appointments are after yours.  Also, if you miss three or more appointments without notifying the office, you may be dismissed from the clinic at the provider's discretion.      For prescription refill requests, have your pharmacy contact our office and allow 72 hours for refills to be completed.    Today you received the following chemotherapy and/or immunotherapy agents Docetaxel  and Folfox   To help prevent nausea and vomiting after your treatment, we encourage you to take your nausea medication as directed.  Docetaxel  Injection What is this medication? DOCETAXEL  (doe se TAX el) treats some types of cancer. It works by slowing down the growth of cancer cells. This medicine may be used for other purposes; ask your health care provider or pharmacist if you have questions. COMMON BRAND NAME(S): Docefrez , Docivyx , Taxotere  What should I tell my care team before I take this medication? They need to know if you have any of these conditions: Kidney disease Liver disease Low white blood cell levels Tingling of the fingers or toes or other nerve disorder An unusual or allergic reaction to docetaxel , polysorbate 80, other medications, foods, dyes,  or preservatives Pregnant or trying to get pregnant Breast-feeding How should I use this medication? This medication is injected into a vein. It is given by your care team in a hospital or clinic setting. Talk to your care team about the use of this medication in children. Special care may be needed. Overdosage: If you think you have taken too much of this medicine contact a poison control center or emergency room at once. NOTE: This medicine is only for you. Do not share this medicine with others. What if I miss a dose? Keep appointments for follow-up doses. It is important not to miss your dose. Call your care team if you are unable to keep an appointment. What may interact with this medication? Do not take this medication with any of the following: Live virus vaccines This medication may also interact with the following: Certain antibiotics, such as clarithromycin, telithromycin Certain antivirals for HIV or hepatitis Certain medications for fungal infections, such as itraconazole, ketoconazole, voriconazole Grapefruit juice Nefazodone Supplements, such as St. John's wort This list may not describe all possible interactions. Give your health care provider a list of all the medicines, herbs, non-prescription drugs, or dietary supplements you use. Also tell them if you smoke, drink alcohol, or use illegal drugs. Some items may interact with your medicine. What should I watch for while using this medication? This medication may make you feel generally unwell. This is not uncommon as chemotherapy can affect healthy cells as well as cancer cells. Report any side effects. Continue your course  of treatment even though you feel ill unless your care team tells you to stop. You may need blood work done while you are taking this medication. This medication can cause serious side effects and infusion reactions. To reduce the risk, your care team may give you other medications to take before receiving  this one. Be sure to follow the directions from your care team. This medication may increase your risk of getting an infection. Call your care team for advice if you get a fever, chills, sore throat, or other symptoms of a cold or flu. Do not treat yourself. Try to avoid being around people who are sick. Avoid taking medications that contain aspirin, acetaminophen , ibuprofen , naproxen , or ketoprofen unless instructed by your care team. These medications may hide a fever. Be careful brushing or flossing your teeth or using a toothpick because you may get an infection or bleed more easily. If you have any dental work done, tell your dentist you are receiving this medication. Some products may contain alcohol. Ask your care team if this medication contains alcohol. Be sure to tell all care teams you are taking this medicine. Certain medications, like metronidazole and disulfiram, can cause an unpleasant reaction when taken with alcohol. The reaction includes flushing, headache, nausea, vomiting, sweating, and increased thirst. The reaction can last from 30 minutes to several hours. This medication may affect your coordination, reaction time, or judgement. Do not drive or operate machinery until you know how this medication affects you. Sit up or stand slowly to reduce the risk of dizzy or fainting spells. Drinking alcohol with this medication can increase the risk of these side effects. Talk to your care team about your risk of cancer. You may be more at risk for certain types of cancer if you take this medication. Talk to your care team if you wish to become pregnant or think you might be pregnant. This medication can cause serious birth defects if taken during pregnancy or if you get pregnant within 2 months after stopping therapy. A negative pregnancy test is required before starting this medication. A reliable form of contraception is recommended while taking this medication and for 2 months after stopping  it. Talk to your care team about reliable forms of contraception. Do not breast-feed while taking this medication and for 1 week after stopping therapy. Use a condom during sex and for 4 months after stopping therapy. Tell your care team right away if you think your partner might be pregnant. This medication can cause serious birth defects. This medication may cause infertility. Talk to your care team if you are concerned about your fertility. What side effects may I notice from receiving this medication? Side effects that you should report to your care team as soon as possible: Allergic reactions--skin rash, itching, hives, swelling of the face, lips, tongue, or throat Change in vision such as blurry vision, seeing halos around lights, vision loss Infection--fever, chills, cough, or sore throat Infusion reactions--chest pain, shortness of breath or trouble breathing, feeling faint or lightheaded Low red blood cell level--unusual weakness or fatigue, dizziness, headache, trouble breathing Pain, tingling, or numbness in the hands or feet Painful swelling, warmth, or redness of the skin, blisters or sores at the infusion site Redness, blistering, peeling, or loosening of the skin, including inside the mouth Sudden or severe stomach pain, bloody diarrhea, fever, nausea, vomiting Swelling of the ankles, hands, or feet Tumor lysis syndrome (TLS)--nausea, vomiting, diarrhea, decrease in the amount of urine, dark urine, unusual  weakness or fatigue, confusion, muscle pain or cramps, fast or irregular heartbeat, joint pain Unusual bruising or bleeding Side effects that usually do not require medical attention (report to your care team if they continue or are bothersome): Change in nail shape, thickness, or color Change in taste Hair loss Increased tears This list may not describe all possible side effects. Call your doctor for medical advice about side effects. You may report side effects to FDA at  1-800-FDA-1088. Where should I keep my medication? This medication is given in a hospital or clinic. It will not be stored at home. NOTE: This sheet is a summary. It may not cover all possible information. If you have questions about this medicine, talk to your doctor, pharmacist, or health care provider.  2024 Elsevier/Gold Standard (2021-04-11 00:00:00)   BELOW ARE SYMPTOMS THAT SHOULD BE REPORTED IMMEDIATELY: *FEVER GREATER THAN 100.4 F (38 C) OR HIGHER *CHILLS OR SWEATING *NAUSEA AND VOMITING THAT IS NOT CONTROLLED WITH YOUR NAUSEA MEDICATION *UNUSUAL SHORTNESS OF BREATH *UNUSUAL BRUISING OR BLEEDING *URINARY PROBLEMS (pain or burning when urinating, or frequent urination) *BOWEL PROBLEMS (unusual diarrhea, constipation, pain near the anus) TENDERNESS IN MOUTH AND THROAT WITH OR WITHOUT PRESENCE OF ULCERS (sore throat, sores in mouth, or a toothache) UNUSUAL RASH, SWELLING OR PAIN  UNUSUAL VAGINAL DISCHARGE OR ITCHING   Items with * indicate a potential emergency and should be followed up as soon as possible or go to the Emergency Department if any problems should occur.  Please show the CHEMOTHERAPY ALERT CARD or IMMUNOTHERAPY ALERT CARD at check-in to the Emergency Department and triage nurse.  Should you have questions after your visit or need to cancel or reschedule your appointment, please contact [facility] [place] - A DEPT OF [facility] [phone]  and follow the prompts.  Office hours are 8:00 a.m. to 4:30 p.m. Monday - Friday. Please note that voicemails left after 4:00 p.m. may not be returned until the following business day.  We are closed weekends and major holidays. You have access to a nurse at all times for urgent questions. Please call the main number to the clinic [phone] and follow the prompts.  For any non-urgent questions, you may also contact your provider using MyChart. We now offer e-Visits for anyone 65 and older to request  care online for non-urgent symptoms. For details visit [email or web address].   Also download the MyChart app! Go to the app store, search MyChart, open the app, select [place], and log in with your MyChart username and password.
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
## 2023-03-02 NOTE — Progress Notes (Signed)
 Nutrition Follow-up:  Pt with malignant neoplasm of lower third of esophagus. He is receiving FLOT q14d (first 12/30). Surgery consult planned 1/20. Patient is under the care of Dr. Rogers.   1/2 - received IV hydration secondary to intractable nausea with vomiting  Met with patient and wife in infusion. Patient reports N/V resolved and has been feeling well the last 2 weeks. Appetite is pretty good. Patient reports difficulties finding foods he wants to eat. Patient has noticed improving dysphagia with start of treatment. He is drinking one protein shake.    Medications: reviewed   Labs: glucose 240, Na 132, K 3.4, albumin 3.1  Anthropometrics: Wt 232 lb 9.4 oz today decreased 3% in 3 weeks - significant   12/23 - 240 lb  12/18 - 234 lb 9.6 oz   NUTRITION DIAGNOSIS: Food and nutrition related knowledge deficit improving    INTERVENTION:  Increase Ensure Max/equivalent - 2/day (samples + coupons) Reviewed strategies for nausea - take antiemetics as prescribed per MD Continue supportive therapy with IVF as needed per MD    MONITORING, EVALUATION, GOAL: wt trends, intake   NEXT VISIT: Monday January 27 during infusion

## 2023-03-02 NOTE — Progress Notes (Signed)
 Patient has been examined by Dr. Ellin Saba. Vital signs and labs have been reviewed by MD - ANC, Creatinine, LFTs, hemoglobin, and platelets are within treatment parameters per M.D. - pt may proceed with treatment.  Primary RN and pharmacy notified.

## 2023-03-03 ENCOUNTER — Inpatient Hospital Stay: Payer: Medicare Other

## 2023-03-03 VITALS — BP 134/78 | HR 67 | Temp 97.7°F | Resp 18

## 2023-03-03 DIAGNOSIS — C155 Malignant neoplasm of lower third of esophagus: Secondary | ICD-10-CM

## 2023-03-03 DIAGNOSIS — Z5111 Encounter for antineoplastic chemotherapy: Secondary | ICD-10-CM | POA: Diagnosis not present

## 2023-03-03 MED ORDER — HEPARIN SOD (PORK) LOCK FLUSH 100 UNIT/ML IV SOLN
500.0000 [IU] | Freq: Once | INTRAVENOUS | Status: AC | PRN
  Administered 2023-03-03: 500 [IU]

## 2023-03-03 MED ORDER — PEGFILGRASTIM-FPGK 6 MG/0.6ML ~~LOC~~ SOSY
6.0000 mg | PREFILLED_SYRINGE | Freq: Once | SUBCUTANEOUS | Status: AC
Start: 2023-03-03 — End: 2023-03-03
  Administered 2023-03-03: 6 mg via SUBCUTANEOUS
  Filled 2023-03-03: qty 0.6

## 2023-03-03 MED ORDER — SODIUM CHLORIDE 0.9% FLUSH
10.0000 mL | INTRAVENOUS | Status: DC | PRN
  Administered 2023-03-03: 10 mL

## 2023-03-03 NOTE — Patient Instructions (Signed)
 [facility] [place] - A DEPT OF [facility]  Discharge Instructions: Thank you for choosing [facility] to provide your oncology and hematology care.  If you have a lab appointment with the Cancer Center - please note that after April 8th, 2024, all labs will be drawn in the cancer center.  You do not have to check in or register with the main entrance as you have in the past but will complete your check-in in the cancer center.  Wear comfortable clothing and clothing appropriate for easy access to any Portacath or PICC line.   We strive to give you quality time with your provider. You may need to reschedule your appointment if you arrive late (15 or more minutes).  Arriving late affects you and other patients whose appointments are after yours.  Also, if you miss three or more appointments without notifying the office, you may be dismissed from the clinic at the provider's discretion.      For prescription refill requests, have your pharmacy contact our office and allow 72 hours for refills to be completed.    Today you received the following chemo pump disconnected and injection given, return as scheduled.   To help prevent nausea and vomiting after your treatment, we encourage you to take your nausea medication as directed.  BELOW ARE SYMPTOMS THAT SHOULD BE REPORTED IMMEDIATELY: *FEVER GREATER THAN 100.4 F (38 C) OR HIGHER *CHILLS OR SWEATING *NAUSEA AND VOMITING THAT IS NOT CONTROLLED WITH YOUR NAUSEA MEDICATION *UNUSUAL SHORTNESS OF BREATH *UNUSUAL BRUISING OR BLEEDING *URINARY PROBLEMS (pain or burning when urinating, or frequent urination) *BOWEL PROBLEMS (unusual diarrhea, constipation, pain near the anus) TENDERNESS IN MOUTH AND THROAT WITH OR WITHOUT PRESENCE OF ULCERS (sore throat, sores in mouth, or a toothache) UNUSUAL RASH, SWELLING OR PAIN  UNUSUAL VAGINAL DISCHARGE OR ITCHING   Items with * indicate a potential emergency and should be  followed up as soon as possible or go to the Emergency Department if any problems should occur.  Please show the CHEMOTHERAPY ALERT CARD or IMMUNOTHERAPY ALERT CARD at check-in to the Emergency Department and triage nurse.  Should you have questions after your visit or need to cancel or reschedule your appointment, please contact [facility] [place] - A DEPT OF [facility] [phone]  and follow the prompts.  Office hours are 8:00 a.m. to 4:30 p.m. Monday - Friday. Please note that voicemails left after 4:00 p.m. may not be returned until the following business day.  We are closed weekends and major holidays. You have access to a nurse at all times for urgent questions. Please call the main number to the clinic [phone] and follow the prompts.  For any non-urgent questions, you may also contact your provider using MyChart. We now offer e-Visits for anyone 87 and older to request care online for non-urgent symptoms. For details visit [email or web address].   Also download the MyChart app! Go to the app store, search MyChart, open the app, select [place], and log in with your MyChart username and password.

## 2023-03-03 NOTE — Progress Notes (Signed)
 Port flushed with good blood return noted. No bruising or swelling at site. Bandaid applied, Patient tolerated injection and patient discharged in satisfactory condition. VVS stable with no signs or symptoms of distressed noted.

## 2023-03-04 ENCOUNTER — Inpatient Hospital Stay: Payer: Medicare Other

## 2023-03-05 ENCOUNTER — Inpatient Hospital Stay: Payer: Medicare Other

## 2023-03-10 ENCOUNTER — Ambulatory Visit: Payer: Medicare Other

## 2023-03-10 ENCOUNTER — Other Ambulatory Visit: Payer: Medicare Other

## 2023-03-10 ENCOUNTER — Ambulatory Visit: Payer: Medicare Other | Admitting: Oncology
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
## 2023-03-13 ENCOUNTER — Encounter: Payer: Self-pay | Admitting: Thoracic Surgery (Cardiothoracic Vascular Surgery)

## 2023-03-13 ENCOUNTER — Institutional Professional Consult (permissible substitution) (INDEPENDENT_AMBULATORY_CARE_PROVIDER_SITE_OTHER): Payer: Medicare Other | Admitting: Thoracic Surgery (Cardiothoracic Vascular Surgery)

## 2023-03-13 VITALS — BP 136/82 | HR 88 | Resp 20 | Ht 72.0 in | Wt 232.0 lb

## 2023-03-13 DIAGNOSIS — C155 Malignant neoplasm of lower third of esophagus: Secondary | ICD-10-CM | POA: Diagnosis not present
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
## 2023-03-16 ENCOUNTER — Inpatient Hospital Stay (HOSPITAL_BASED_OUTPATIENT_CLINIC_OR_DEPARTMENT_OTHER): Payer: Medicare Other | Admitting: Hematology

## 2023-03-16 ENCOUNTER — Inpatient Hospital Stay: Payer: Medicare Other

## 2023-03-16 ENCOUNTER — Inpatient Hospital Stay: Payer: Medicare Other | Admitting: Dietician

## 2023-03-16 VITALS — BP 126/79 | HR 70 | Temp 97.5°F | Resp 18

## 2023-03-16 VITALS — Wt 229.6 lb

## 2023-03-16 DIAGNOSIS — C155 Malignant neoplasm of lower third of esophagus: Secondary | ICD-10-CM

## 2023-03-16 DIAGNOSIS — Z5111 Encounter for antineoplastic chemotherapy: Secondary | ICD-10-CM | POA: Diagnosis not present

## 2023-03-16 LAB — CBC WITH DIFFERENTIAL/PLATELET
Abs Immature Granulocytes: 0.48 10*3/uL — ABNORMAL HIGH (ref 0.00–0.07)
Basophils Absolute: 0.1 10*3/uL (ref 0.0–0.1)
Basophils Relative: 1 %
Eosinophils Absolute: 0.2 10*3/uL (ref 0.0–0.5)
Eosinophils Relative: 1 %
HCT: 38.9 % — ABNORMAL LOW (ref 39.0–52.0)
Hemoglobin: 12.3 g/dL — ABNORMAL LOW (ref 13.0–17.0)
Immature Granulocytes: 3 %
Lymphocytes Relative: 11 %
Lymphs Abs: 1.8 10*3/uL (ref 0.7–4.0)
MCH: 28.4 pg (ref 26.0–34.0)
MCHC: 31.6 g/dL (ref 30.0–36.0)
MCV: 89.8 fL (ref 80.0–100.0)
Monocytes Absolute: 0.8 10*3/uL (ref 0.1–1.0)
Monocytes Relative: 5 %
Neutro Abs: 12.9 10*3/uL — ABNORMAL HIGH (ref 1.7–7.7)
Neutrophils Relative %: 79 %
Platelets: 224 10*3/uL (ref 150–400)
RBC: 4.33 MIL/uL (ref 4.22–5.81)
RDW: 15.4 % (ref 11.5–15.5)
WBC: 16.3 10*3/uL — ABNORMAL HIGH (ref 4.0–10.5)
nRBC: 0.2 % (ref 0.0–0.2)

## 2023-03-16 LAB — COMPREHENSIVE METABOLIC PANEL
ALT: 26 U/L (ref 0–44)
AST: 15 U/L (ref 15–41)
Albumin: 3.3 g/dL — ABNORMAL LOW (ref 3.5–5.0)
Alkaline Phosphatase: 129 U/L — ABNORMAL HIGH (ref 38–126)
Anion gap: 8 (ref 5–15)
BUN: 13 mg/dL (ref 8–23)
CO2: 28 mmol/L (ref 22–32)
Calcium: 8.8 mg/dL — ABNORMAL LOW (ref 8.9–10.3)
Chloride: 99 mmol/L (ref 98–111)
Creatinine, Ser: 0.67 mg/dL (ref 0.61–1.24)
GFR, Estimated: >60 mL/min (ref >60)
Glucose, Bld: 266 mg/dL — ABNORMAL HIGH (ref 70–99)
Potassium: 3.5 mmol/L (ref 3.5–5.1)
Sodium: 135 mmol/L (ref 135–145)
Total Bilirubin: 0.6 mg/dL (ref 0.0–1.2)
Total Protein: 6.3 g/dL — ABNORMAL LOW (ref 6.5–8.1)

## 2023-03-16 LAB — MAGNESIUM: Magnesium: 1.8 mg/dL (ref 1.7–2.4)

## 2023-03-16 MED ORDER — DEXTROSE 5 % IV SOLN: INTRAVENOUS | Status: DC

## 2023-03-16 MED ORDER — OXALIPLATIN CHEMO INJECTION 100 MG/20ML
85.0000 mg/m2 | Freq: Once | INTRAVENOUS | Status: AC
  Administered 2023-03-16: 200 mg via INTRAVENOUS
  Filled 2023-03-16: qty 40

## 2023-03-16 MED ORDER — SODIUM CHLORIDE 0.9 % IV SOLN
150.0000 mg | Freq: Once | INTRAVENOUS | Status: AC
  Administered 2023-03-16: 150 mg via INTRAVENOUS
  Filled 2023-03-16: qty 150

## 2023-03-16 MED ORDER — GLIPIZIDE 5 MG PO TABS: 10.0000 mg | ORAL_TABLET | Freq: Every day | ORAL | 2 refills | Status: DC

## 2023-03-16 MED ORDER — DEXAMETHASONE SODIUM PHOSPHATE 10 MG/ML IJ SOLN
10.0000 mg | Freq: Once | INTRAMUSCULAR | Status: AC
  Administered 2023-03-16: 10 mg via INTRAVENOUS
  Filled 2023-03-16: qty 1

## 2023-03-16 MED ORDER — PALONOSETRON HCL INJECTION 0.25 MG/5ML
0.2500 mg | Freq: Once | INTRAVENOUS | Status: AC
  Administered 2023-03-16: 0.25 mg via INTRAVENOUS
  Filled 2023-03-16: qty 5

## 2023-03-16 MED ORDER — FLUOROURACIL CHEMO INJECTION 5 GM/100ML
2600.0000 mg/m2 | INTRAVENOUS | Status: DC
  Administered 2023-03-16: 6050 mg via INTRAVENOUS
  Filled 2023-03-16: qty 121

## 2023-03-16 MED ORDER — LEUCOVORIN CALCIUM INJECTION 350 MG
200.0000 mg/m2 | Freq: Once | INTRAVENOUS | Status: AC
  Administered 2023-03-16: 464 mg via INTRAVENOUS
  Filled 2023-03-16: qty 23.2

## 2023-03-16 MED ORDER — INSULIN ASPART 100 UNIT/ML IJ SOLN
10.0000 [IU] | Freq: Once | INTRAMUSCULAR | Status: AC
  Administered 2023-03-16: 10 [IU] via SUBCUTANEOUS
  Filled 2023-03-16: qty 0.1

## 2023-03-16 MED ORDER — SODIUM CHLORIDE 0.9% FLUSH
10.0000 mL | Freq: Once | INTRAVENOUS | Status: AC
  Administered 2023-03-16: 10 mL via INTRAVENOUS

## 2023-03-16 MED ORDER — SODIUM CHLORIDE 0.9 % IV SOLN
50.0000 mg/m2 | Freq: Once | INTRAVENOUS | Status: AC
  Administered 2023-03-16: 116 mg via INTRAVENOUS
  Filled 2023-03-16: qty 11.6

## 2023-03-16 MED ORDER — SODIUM CHLORIDE 0.9 % IV SOLN: Freq: Once | INTRAVENOUS | Status: AC

## 2023-03-16 NOTE — Patient Instructions (Addendum)
[facility] at [facility] Discharge Instructions   You were seen and examined today by Dr. Ellin Saba.  He reviewed the results of your lab work which are mostly normal/stable. Your blood sugar is elevated at 266. We will give you insulin in the clinic today. We also sent a prescription for a pill called Glipizide. Take it with your lunch daily.  We will proceed with your treatment today.   Return as scheduled.    Thank you for choosing [facility] at [facility] to provide your oncology and hematology care.  To afford each patient quality time with our provider, please arrive at least 15 minutes before your scheduled appointment time.   If you have a lab appointment with the Cancer Center please come in thru the Main Entrance and check in at the main information desk.  You need to re-schedule your appointment should you arrive 10 or more minutes late.  We strive to give you quality time with our providers, and arriving late affects you and other patients whose appointments are after yours.  Also, if you no show three or more times for appointments you may be dismissed from the clinic at the providers discretion.     Again, thank you for choosing [facility].  Our hope is that these requests will decrease the amount of time that you wait before being seen by our physicians.       _____________________________________________________________  Should you have questions after your visit to [facility], please contact our office at [phone] and follow the prompts.  Our office hours are 8:00 a.m. and 4:30 p.m. Monday - Friday.  Please note that voicemails left after 4:00 p.m. may not be returned until the following business day.  We are closed weekends and major holidays.  You do have access to a nurse 24-7, just call the main number to the clinic [phone] and do not press any options, hold on the line and a nurse will  answer the phone.    For prescription refill requests, have your pharmacy contact our office and allow 72 hours.    Due to Covid, you will need to wear a mask upon entering the hospital. If you do not have a mask, a mask will be given to you at the Main Entrance upon arrival. For doctor visits, patients may have 1 support person age 41 or older with them. For treatment visits, patients can not have anyone with them due to social distancing guidelines and our immunocompromised population.

## 2023-03-16 NOTE — Progress Notes (Signed)
Nutrition Follow-up:   Pt with malignant neoplasm of lower third of esophagus. He is receiving FLOT q14d (first 12/30). Patient is under the care of Dr. Ellin Saba.   Met with patient in infusion. Pt sleeping soundly at time of visit. Nutrition follow-up completed with wife. He tolerated cycle 2 much better. Pt did not have N/V as he did following first treatment. Wife reports patient is eating well. She is surprised at how much he eats sometimes. Patient drinking 1-2 protein shakes. He does have cold sensitivity lasting into second week. Patient has had some diarrhea. Wife reports this is related in part to other medications he is taking. Dr. Ellin Saba decreased metformin and added glipizide today. Per wife, pt is sleeping more recently. Wakes up ~11 AM and napping.    Medications: glipizide, norco, compazine  Labs: glucose 266, albumin 3.3  Anthropometrics: Wt 229 lb 9.6 oz today decreased   1/13 - 232 lb 9.4 oz 12/23 - 240 lb 12/18 - 234 lb 9.6 oz   NUTRITION DIAGNOSIS: Food and nutrition related knowledge deficit - ongoing    INTERVENTION:  Encouraged 2 Ensure Max/equivalent  Encouraged high calorie high protein foods to minimize further wt loss Encouraged daily activity as able  Support and encouragement     MONITORING, EVALUATION, GOAL: wt trends, intake   NEXT VISIT: Monday February 10 during infusion

## 2023-03-16 NOTE — Progress Notes (Signed)
Patient tolerated chemotherapy with no complaints voiced.  Side effects with management reviewed with understanding verbalized.  Port site clean and dry with no bruising or swelling noted at site.  Good blood return noted before and after administration of chemotherapy. 5FU pump started for home use. Pt due to return 03/17/2023 for pump stop. Patient left in satisfactory condition with VSS and no s/s of distress noted. All follow ups as scheduled.   Leidy Massar [employer]

## 2023-03-16 NOTE — Progress Notes (Signed)
Patient has been examined by Dr. Ellin Saba. Vital signs and labs have been reviewed by MD - ANC, Creatinine, LFTs, hemoglobin, and platelets are within treatment parameters per M.D. - pt may proceed with treatment.  Primary RN and pharmacy notified.

## 2023-03-16 NOTE — Patient Instructions (Addendum)
[facility] [place] - A DEPT OF [facility]  Discharge Instructions: Thank you for choosing [facility] to provide your oncology and hematology care.  If you have a lab appointment with the Cancer Center - please note that after April 8th, 2024, all labs will be drawn in the cancer center.  You do not have to check in or register with the main entrance as you have in the past but will complete your check-in in the cancer center.  Wear comfortable clothing and clothing appropriate for easy access to any Portacath or PICC line.   We strive to give you quality time with your provider. You may need to reschedule your appointment if you arrive late (15 or more minutes).  Arriving late affects you and other patients whose appointments are after yours.  Also, if you miss three or more appointments without notifying the office, you may be dismissed from the clinic at the provider's discretion.      For prescription refill requests, have your pharmacy contact our office and allow 72 hours for refills to be completed.    Today you received the following chemotherapy and/or immunotherapy agents Taxotere, Oxaplatin, leucovorin, adrucil    To help prevent nausea and vomiting after your treatment, we encourage you to take your nausea medication as directed.  BELOW ARE SYMPTOMS THAT SHOULD BE REPORTED IMMEDIATELY: *FEVER GREATER THAN 100.4 F (38 C) OR HIGHER *CHILLS OR SWEATING *NAUSEA AND VOMITING THAT IS NOT CONTROLLED WITH YOUR NAUSEA MEDICATION *UNUSUAL SHORTNESS OF BREATH *UNUSUAL BRUISING OR BLEEDING *URINARY PROBLEMS (pain or burning when urinating, or frequent urination) *BOWEL PROBLEMS (unusual diarrhea, constipation, pain near the anus) TENDERNESS IN MOUTH AND THROAT WITH OR WITHOUT PRESENCE OF ULCERS (sore throat, sores in mouth, or a toothache) UNUSUAL RASH, SWELLING OR PAIN  UNUSUAL VAGINAL DISCHARGE OR ITCHING   Items with * indicate a potential  emergency and should be followed up as soon as possible or go to the Emergency Department if any problems should occur.  Please show the CHEMOTHERAPY ALERT CARD or IMMUNOTHERAPY ALERT CARD at check-in to the Emergency Department and triage nurse.  Should you have questions after your visit or need to cancel or reschedule your appointment, please contact [facility] [place] - A DEPT OF Eligha Bridegroom [facility] [phone]  and follow the prompts.  Office hours are 8:00 a.m. to 4:30 p.m. Monday - Friday. Please note that voicemails left after 4:00 p.m. may not be returned until the following business day.  We are closed weekends and major holidays. You have access to a nurse at all times for urgent questions. Please call the main number to the clinic [phone] and follow the prompts.  For any non-urgent questions, you may also contact your provider using MyChart. We now offer e-Visits for anyone 17 and older to request care online for non-urgent symptoms. For details visit [email or web address].   Also download the MyChart app! Go to the app store, search "MyChart", open the app, select [place], and log in with your MyChart username and password.

## 2023-03-17 ENCOUNTER — Inpatient Hospital Stay: Payer: Medicare Other

## 2023-03-17 VITALS — BP 121/76 | HR 88 | Temp 97.8°F | Resp 18

## 2023-03-17 DIAGNOSIS — Z5111 Encounter for antineoplastic chemotherapy: Secondary | ICD-10-CM | POA: Diagnosis not present

## 2023-03-17 DIAGNOSIS — C155 Malignant neoplasm of lower third of esophagus: Secondary | ICD-10-CM

## 2023-03-17 MED ORDER — SODIUM CHLORIDE 0.9% FLUSH
10.0000 mL | INTRAVENOUS | Status: DC | PRN
  Administered 2023-03-17: 10 mL

## 2023-03-17 MED ORDER — HEPARIN SOD (PORK) LOCK FLUSH 100 UNIT/ML IV SOLN
500.0000 [IU] | Freq: Once | INTRAVENOUS | Status: AC | PRN
  Administered 2023-03-17: 500 [IU]

## 2023-03-17 MED ORDER — PEGFILGRASTIM-FPGK 6 MG/0.6ML ~~LOC~~ SOSY
6.0000 mg | PREFILLED_SYRINGE | Freq: Once | SUBCUTANEOUS | Status: AC
  Administered 2023-03-17: 6 mg via SUBCUTANEOUS
  Filled 2023-03-17: qty 0.6

## 2023-03-17 NOTE — Progress Notes (Signed)
Patient presents today for pump d/c. Vital signs are stable. Port a cath site clean, dry, and intact. Port flushed with 10 mls of Normal Saline and 500 Units of Heparin. Needle removed intact. Band aid applied. Patient has no complaints at this time. Stimufend injection given without any complications.  Discharged from clinic ambulatory and in stable condition. Patient alert and oriented. All follow ups as scheduled.   Samuel Hubbard [employer]

## 2023-03-18 ENCOUNTER — Inpatient Hospital Stay: Payer: Medicare Other

## 2023-03-18 ENCOUNTER — Other Ambulatory Visit: Payer: Self-pay | Admitting: Internal Medicine

## 2023-03-18 DIAGNOSIS — I10 Essential (primary) hypertension: Secondary | ICD-10-CM
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
## 2023-03-30 ENCOUNTER — Inpatient Hospital Stay (HOSPITAL_BASED_OUTPATIENT_CLINIC_OR_DEPARTMENT_OTHER): Payer: Medicare Other | Admitting: Hematology

## 2023-03-30 ENCOUNTER — Inpatient Hospital Stay: Payer: Medicare Other

## 2023-03-30 ENCOUNTER — Inpatient Hospital Stay: Payer: Medicare Other | Attending: Hematology

## 2023-03-30 ENCOUNTER — Inpatient Hospital Stay: Payer: Medicare Other | Admitting: Dietician

## 2023-03-30 VITALS — BP 119/78 | HR 78 | Temp 97.5°F | Resp 18 | Ht 72.0 in | Wt 226.0 lb

## 2023-03-30 VITALS — BP 130/76 | HR 64 | Temp 97.4°F | Resp 16

## 2023-03-30 DIAGNOSIS — C155 Malignant neoplasm of lower third of esophagus: Secondary | ICD-10-CM

## 2023-03-30 DIAGNOSIS — K5900 Constipation, unspecified: Secondary | ICD-10-CM | POA: Insufficient documentation

## 2023-03-30 DIAGNOSIS — Z5111 Encounter for antineoplastic chemotherapy: Secondary | ICD-10-CM | POA: Diagnosis present

## 2023-03-30 DIAGNOSIS — Z801 Family history of malignant neoplasm of trachea, bronchus and lung: Secondary | ICD-10-CM | POA: Diagnosis not present

## 2023-03-30 DIAGNOSIS — G4733 Obstructive sleep apnea (adult) (pediatric): Secondary | ICD-10-CM | POA: Insufficient documentation

## 2023-03-30 DIAGNOSIS — N200 Calculus of kidney: Secondary | ICD-10-CM | POA: Diagnosis not present

## 2023-03-30 DIAGNOSIS — Z7901 Long term (current) use of anticoagulants: Secondary | ICD-10-CM | POA: Insufficient documentation

## 2023-03-30 DIAGNOSIS — Z79899 Other long term (current) drug therapy: Secondary | ICD-10-CM | POA: Diagnosis not present

## 2023-03-30 DIAGNOSIS — Z803 Family history of malignant neoplasm of breast: Secondary | ICD-10-CM | POA: Diagnosis not present

## 2023-03-30 DIAGNOSIS — M25562 Pain in left knee: Secondary | ICD-10-CM | POA: Diagnosis not present

## 2023-03-30 DIAGNOSIS — N400 Enlarged prostate without lower urinary tract symptoms: Secondary | ICD-10-CM | POA: Insufficient documentation

## 2023-03-30 DIAGNOSIS — E039 Hypothyroidism, unspecified: Secondary | ICD-10-CM | POA: Diagnosis not present

## 2023-03-30 DIAGNOSIS — I10 Essential (primary) hypertension: Secondary | ICD-10-CM | POA: Diagnosis not present

## 2023-03-30 DIAGNOSIS — K5730 Diverticulosis of large intestine without perforation or abscess without bleeding: Secondary | ICD-10-CM | POA: Diagnosis not present

## 2023-03-30 DIAGNOSIS — K219 Gastro-esophageal reflux disease without esophagitis: Secondary | ICD-10-CM | POA: Diagnosis not present

## 2023-03-30 DIAGNOSIS — E119 Type 2 diabetes mellitus without complications: Secondary | ICD-10-CM | POA: Insufficient documentation

## 2023-03-30 DIAGNOSIS — Z87442 Personal history of urinary calculi: Secondary | ICD-10-CM | POA: Diagnosis not present

## 2023-03-30 DIAGNOSIS — I4891 Unspecified atrial fibrillation: Secondary | ICD-10-CM | POA: Insufficient documentation

## 2023-03-30 DIAGNOSIS — I700 Atherosclerosis of aorta: Secondary | ICD-10-CM | POA: Insufficient documentation

## 2023-03-30 LAB — COMPREHENSIVE METABOLIC PANEL
ALT: 33 U/L (ref 0–44)
AST: 18 U/L (ref 15–41)
Albumin: 3.2 g/dL — ABNORMAL LOW (ref 3.5–5.0)
Alkaline Phosphatase: 110 U/L (ref 38–126)
Anion gap: 10 (ref 5–15)
BUN: 13 mg/dL (ref 8–23)
CO2: 25 mmol/L (ref 22–32)
Calcium: 8.7 mg/dL — ABNORMAL LOW (ref 8.9–10.3)
Chloride: 100 mmol/L (ref 98–111)
Creatinine, Ser: 0.65 mg/dL (ref 0.61–1.24)
GFR, Estimated: >60 mL/min (ref >60)
Glucose, Bld: 264 mg/dL — ABNORMAL HIGH (ref 70–99)
Potassium: 3.6 mmol/L (ref 3.5–5.1)
Sodium: 135 mmol/L (ref 135–145)
Total Bilirubin: 0.4 mg/dL (ref 0.0–1.2)
Total Protein: 5.9 g/dL — ABNORMAL LOW (ref 6.5–8.1)

## 2023-03-30 LAB — CBC WITH DIFFERENTIAL/PLATELET
Abs Immature Granulocytes: 0.3 10*3/uL — ABNORMAL HIGH (ref 0.00–0.07)
Basophils Absolute: 0.1 10*3/uL (ref 0.0–0.1)
Basophils Relative: 1 %
Eosinophils Absolute: 0.2 10*3/uL (ref 0.0–0.5)
Eosinophils Relative: 1 %
HCT: 38.4 % — ABNORMAL LOW (ref 39.0–52.0)
Hemoglobin: 12.3 g/dL — ABNORMAL LOW (ref 13.0–17.0)
Immature Granulocytes: 2 %
Lymphocytes Relative: 16 %
Lymphs Abs: 2.2 10*3/uL (ref 0.7–4.0)
MCH: 29.3 pg (ref 26.0–34.0)
MCHC: 32 g/dL (ref 30.0–36.0)
MCV: 91.4 fL (ref 80.0–100.0)
Monocytes Absolute: 0.7 10*3/uL (ref 0.1–1.0)
Monocytes Relative: 5 %
Neutro Abs: 10.1 10*3/uL — ABNORMAL HIGH (ref 1.7–7.7)
Neutrophils Relative %: 75 %
Platelets: 205 10*3/uL (ref 150–400)
RBC: 4.2 MIL/uL — ABNORMAL LOW (ref 4.22–5.81)
RDW: 17 % — ABNORMAL HIGH (ref 11.5–15.5)
WBC: 13.7 10*3/uL — ABNORMAL HIGH (ref 4.0–10.5)
nRBC: 0 % (ref 0.0–0.2)

## 2023-03-30 LAB — MAGNESIUM: Magnesium: 1.8 mg/dL (ref 1.7–2.4)

## 2023-03-30 MED ORDER — SODIUM CHLORIDE 0.9% FLUSH
10.0000 mL | Freq: Once | INTRAVENOUS | Status: AC
  Administered 2023-03-30: 10 mL via INTRAVENOUS

## 2023-03-30 MED ORDER — SODIUM CHLORIDE 0.9 % IV SOLN: Freq: Once | INTRAVENOUS | Status: AC

## 2023-03-30 MED ORDER — SODIUM CHLORIDE 0.9 % IV SOLN
2600.0000 mg/m2 | INTRAVENOUS | Status: DC
  Administered 2023-03-30: 6050 mg via INTRAVENOUS
  Filled 2023-03-30: qty 100

## 2023-03-30 MED ORDER — LEUCOVORIN CALCIUM INJECTION 350 MG
200.0000 mg/m2 | Freq: Once | INTRAVENOUS | Status: AC
  Administered 2023-03-30: 464 mg via INTRAVENOUS
  Filled 2023-03-30: qty 23.2

## 2023-03-30 MED ORDER — DEXAMETHASONE SODIUM PHOSPHATE 10 MG/ML IJ SOLN
10.0000 mg | Freq: Once | INTRAMUSCULAR | Status: AC
  Administered 2023-03-30: 10 mg via INTRAVENOUS
  Filled 2023-03-30: qty 1

## 2023-03-30 MED ORDER — DEXTROSE 5 % IV SOLN: INTRAVENOUS | Status: DC

## 2023-03-30 MED ORDER — SODIUM CHLORIDE 0.9 % IV SOLN: INTRAVENOUS | Status: DC

## 2023-03-30 MED ORDER — OXALIPLATIN CHEMO INJECTION 100 MG/20ML
85.0000 mg/m2 | Freq: Once | INTRAVENOUS | Status: AC
  Administered 2023-03-30: 200 mg via INTRAVENOUS
  Filled 2023-03-30: qty 40

## 2023-03-30 MED ORDER — PALONOSETRON HCL INJECTION 0.25 MG/5ML
0.2500 mg | Freq: Once | INTRAVENOUS | Status: AC
  Administered 2023-03-30: 0.25 mg via INTRAVENOUS
  Filled 2023-03-30: qty 5

## 2023-03-30 MED ORDER — SODIUM CHLORIDE 0.9 % IV SOLN
150.0000 mg | Freq: Once | INTRAVENOUS | Status: AC
  Administered 2023-03-30: 150 mg via INTRAVENOUS
  Filled 2023-03-30: qty 150

## 2023-03-30 MED ORDER — SODIUM CHLORIDE 0.9 % IV SOLN
50.0000 mg/m2 | Freq: Once | INTRAVENOUS | Status: AC
  Administered 2023-03-30: 116 mg via INTRAVENOUS
  Filled 2023-03-30: qty 11.6

## 2023-03-30 MED ORDER — INSULIN ASPART 100 UNIT/ML IJ SOLN
10.0000 [IU] | Freq: Once | INTRAMUSCULAR | Status: AC
  Administered 2023-03-30: 10 [IU] via SUBCUTANEOUS
  Filled 2023-03-30: qty 1

## 2023-03-30 NOTE — Patient Instructions (Signed)

## 2023-03-30 NOTE — Progress Notes (Signed)
 Nutrition Follow-up:   Pt with malignant neoplasm of lower third of esophagus. He is receiving FLOT q14d (first 12/30)  Met with patient and wife in infusion. Patient reports doing well overall. He has been trying to snack more in between meals. Patient reports often waking up hungry in the middle of night. His taste if off. States foods are not tolerable after a few bites. He is drinking 2 Ensure Max. Patient continues to have nausea. He will start compazine  q6 per MD. Patient is pushing water. This is challenging to drink at room temperature. He denies diarrhea, constipation.    Medications: reviewed   Labs: albumin 3.2  Anthropometrics: Wt 225 lb 15.5 oz today - trending down   1/27 - 229 lb 9.6 1/13 - 232 lb 9.4 oz 12/23 - 240 lb 12/18 - 234 lb 9.6 oz     NUTRITION DIAGNOSIS: Food and nutrition related knowledge deficit - improving   INTERVENTION:  Educated on strategies for altered taste, suggested trying baking soda salt water rinses several times daily and before meals - handout with tips + recipe  Suggested increasing Ensure Max - TID as tolerated (samples + coupons) Take antiemetics as prescribed per MD Encourage activity as able Surgery plans ([facility]) pending 2/24 restaging scans     MONITORING, EVALUATION, GOAL: wt trends,intake, surgery   NEXT VISIT: To be scheduled in collaboration with upcoming [facility] appointments

## 2023-03-30 NOTE — Patient Instructions (Signed)
 [facility] [place] - A DEPT OF [facility]  Discharge Instructions: Thank you for choosing [facility] to provide your oncology and hematology care.  If you have a lab appointment with the Cancer Center - please note that after April 8th, 2024, all labs will be drawn in the cancer center.  You do not have to check in or register with the main entrance as you have in the past but will complete your check-in in the cancer center.  Wear comfortable clothing and clothing appropriate for easy access to any Portacath or PICC line.   We strive to give you quality time with your provider. You may need to reschedule your appointment if you arrive late (15 or more minutes).  Arriving late affects you and other patients whose appointments are after yours.  Also, if you miss three or more appointments without notifying the office, you may be dismissed from the clinic at the provider's discretion.      For prescription refill requests, have your pharmacy contact our office and allow 72 hours for refills to be completed.    Today you received the following chemotherapy and/or immunotherapy agents FLOT   To help prevent nausea and vomiting after your treatment, we encourage you to take your nausea medication as directed.  BELOW ARE SYMPTOMS THAT SHOULD BE REPORTED IMMEDIATELY: *FEVER GREATER THAN 100.4 F (38 C) OR HIGHER *CHILLS OR SWEATING *NAUSEA AND VOMITING THAT IS NOT CONTROLLED WITH YOUR NAUSEA MEDICATION *UNUSUAL SHORTNESS OF BREATH *UNUSUAL BRUISING OR BLEEDING *URINARY PROBLEMS (pain or burning when urinating, or frequent urination) *BOWEL PROBLEMS (unusual diarrhea, constipation, pain near the anus) TENDERNESS IN MOUTH AND THROAT WITH OR WITHOUT PRESENCE OF ULCERS (sore throat, sores in mouth, or a toothache) UNUSUAL RASH, SWELLING OR PAIN  UNUSUAL VAGINAL DISCHARGE OR ITCHING   Items with * indicate a potential emergency and should be followed up as soon  as possible or go to the Emergency Department if any problems should occur.  Please show the CHEMOTHERAPY ALERT CARD or IMMUNOTHERAPY ALERT CARD at check-in to the Emergency Department and triage nurse.  Should you have questions after your visit or need to cancel or reschedule your appointment, please contact [facility] [place] - A DEPT OF [facility] [phone]  and follow the prompts.  Office hours are 8:00 a.m. to 4:30 p.m. Monday - Friday. Please note that voicemails left after 4:00 p.m. may not be returned until the following business day.  We are closed weekends and major holidays. You have access to a nurse at all times for urgent questions. Please call the main number to the clinic [phone] and follow the prompts.  For any non-urgent questions, you may also contact your provider using MyChart. We now offer e-Visits for anyone 44 and older to request care online for non-urgent symptoms. For details visit [email or web address].   Also download the MyChart app! Go to the app store, search "MyChart", open the app, select [place], and log in with your MyChart username and password.

## 2023-03-30 NOTE — Progress Notes (Signed)
 Patient has been examined by Dr. Ellin Saba. Vital signs and labs have been reviewed by MD - ANC, Creatinine, LFTs, hemoglobin, and platelets are within treatment parameters per M.D. - pt may proceed with treatment.  Primary RN and pharmacy notified.

## 2023-03-30 NOTE — Progress Notes (Signed)
 Patient presents today for FLOT infusion. Patient is in satisfactory condition with no new complaints voiced.  Vital signs are stable.  Labs reviewed by Dr. Cheree Cords during the office visit and all labs are within treatment parameters. Patient will receive 500 mL over 1 hour per Dr.K. Patient will also receive 10 units SQ of Novolog  insulin  due to blood sugar of 264 per Dr.K's standing orders. We will proceed with treatment per MD orders.   Treatment given today per MD orders. Tolerated infusion without adverse affects. Vital signs stable. No complaints at this time. Discharged from clinic ambulatory in stable condition. Alert and oriented x 3. F/U with [facility] as scheduled. 5FU ambulatory pump infusing with no alarms beeping.

## 2023-03-31 ENCOUNTER — Inpatient Hospital Stay: Payer: Medicare Other

## 2023-03-31 ENCOUNTER — Ambulatory Visit: Payer: Medicare Other

## 2023-03-31 VITALS — BP 125/68 | HR 99 | Temp 97.9°F | Resp 18

## 2023-03-31 DIAGNOSIS — Z5111 Encounter for antineoplastic chemotherapy: Secondary | ICD-10-CM | POA: Diagnosis not present

## 2023-03-31 DIAGNOSIS — C155 Malignant neoplasm of lower third of esophagus: Secondary | ICD-10-CM

## 2023-03-31 MED ORDER — SODIUM CHLORIDE 0.9% FLUSH
10.0000 mL | INTRAVENOUS | Status: DC | PRN
  Administered 2023-03-31: 10 mL

## 2023-03-31 MED ORDER — HEPARIN SOD (PORK) LOCK FLUSH 100 UNIT/ML IV SOLN
500.0000 [IU] | Freq: Once | INTRAVENOUS | Status: AC | PRN
Start: 2023-03-31 — End: 2023-03-31
  Administered 2023-03-31: 500 [IU]

## 2023-03-31 MED ORDER — PEGFILGRASTIM-FPGK 6 MG/0.6ML ~~LOC~~ SOSY
6.0000 mg | PREFILLED_SYRINGE | Freq: Once | SUBCUTANEOUS | Status: AC
  Administered 2023-03-31: 6 mg via SUBCUTANEOUS
  Filled 2023-03-31: qty 0.6

## 2023-03-31 NOTE — Patient Instructions (Signed)
[facility] [place] [facility]  Discharge Instructions: Thank you for choosing [facility] to provide your oncology and hematology care.  If you have a lab appointment with the Cancer Center - please note that after April 8th, 2024, all labs will be drawn in the cancer center.  You do not have to check in or register with the main entrance as you have in the past but will complete your check-in in the cancer center.  Wear comfortable clothing and clothing appropriate for easy access to any Portacath or PICC line.   We strive to give you quality time with your provider. You may need to reschedule your appointment if you arrive late (15 or more minutes).  Arriving late affects you and other patients whose appointments are after yours.  Also, if you miss three or more appointments without notifying the office, you may be dismissed from the clinic at the provider's discretion.      For prescription refill requests, have your pharmacy contact our office and allow 72 hours for refills to be completed.    Today you had your ambulatory pump disconnected and stimufend injection   To help prevent nausea and vomiting after your treatment, we encourage you to take your nausea medication as directed.  BELOW ARE SYMPTOMS THAT SHOULD BE REPORTED IMMEDIATELY: *FEVER GREATER THAN 100.4 F (38 C) OR HIGHER *CHILLS OR SWEATING *NAUSEA AND VOMITING THAT IS NOT CONTROLLED WITH YOUR NAUSEA MEDICATION *UNUSUAL SHORTNESS OF BREATH *UNUSUAL BRUISING OR BLEEDING *URINARY PROBLEMS (pain or burning when urinating, or frequent urination) *BOWEL PROBLEMS (unusual diarrhea, constipation, pain near the anus) TENDERNESS IN MOUTH AND THROAT WITH OR WITHOUT PRESENCE OF ULCERS (sore throat, sores in mouth, or a toothache) UNUSUAL RASH, SWELLING OR PAIN  UNUSUAL VAGINAL DISCHARGE OR ITCHING   Items with * indicate a potential emergency and should be followed up as soon as  possible or go to the Emergency Department if any problems should occur.  Please show the CHEMOTHERAPY ALERT CARD or IMMUNOTHERAPY ALERT CARD at check-in to the Emergency Department and triage nurse.  Should you have questions after your visit or need to cancel or reschedule your appointment, please contact [facility] CANCER CTR [place] - A DEPT OF Eligha Bridegroom [facility] [phone]  and follow the prompts.  Office hours are 8:00 a.m. to 4:30 p.m. Monday - Friday. Please note that voicemails left after 4:00 p.m. may not be returned until the following business day.  We are closed weekends and major holidays. You have access to a nurse at all times for urgent questions. Please call the main number to the clinic [phone] and follow the prompts.  For any non-urgent questions, you may also contact your provider using MyChart. We now offer e-Visits for anyone 23 and older to request care online for non-urgent symptoms. For details visit [email or web address].   Also download the MyChart app! Go to the app store, search "MyChart", open the app, select [place], and log in with your MyChart username and password.

## 2023-03-31 NOTE — Telephone Encounter (Signed)
Copied from CRM [id number]. Topic: Appointments - Scheduling Inquiry for Clinic >> Mar 31, 2023  9:29 AM Maxwell Marion wrote: Reason for CRM: Patient is needing to reschedule video annual wellness appointment that's for today at 2:30pm. When attempted to reschedule, next available appointment isn't showing until April. Please contact the patient to reschedule, [phone]

## 2023-03-31 NOTE — Progress Notes (Signed)
Samuel Hubbard presents to have home infusion pump d/c'd and for port-a-cath deaccess with flush.  Portacath located right chest wall accessed with  H 20 needle.  Good blood return present. Portacath flushed with NS and 500U/25ml Heparin, and needle removed intact.  Procedure tolerated well and without incident.

## 2023-04-06 ENCOUNTER — Inpatient Hospital Stay: Payer: Medicare Other

## 2023-04-06 ENCOUNTER — Other Ambulatory Visit: Payer: Medicare Other

## 2023-04-06 ENCOUNTER — Telehealth: Payer: Self-pay | Admitting: *Deleted

## 2023-04-06 VITALS — BP 124/87 | HR 81 | Temp 97.6°F | Resp 18

## 2023-04-06 DIAGNOSIS — C155 Malignant neoplasm of lower third of esophagus: Secondary | ICD-10-CM

## 2023-04-06 DIAGNOSIS — Z5111 Encounter for antineoplastic chemotherapy: Secondary | ICD-10-CM | POA: Diagnosis not present

## 2023-04-06 LAB — BASIC METABOLIC PANEL
Anion gap: 10 (ref 5–15)
BUN: 11 mg/dL (ref 8–23)
CO2: 25 mmol/L (ref 22–32)
Calcium: 8.8 mg/dL — ABNORMAL LOW (ref 8.9–10.3)
Chloride: 101 mmol/L (ref 98–111)
Creatinine, Ser: 0.65 mg/dL (ref 0.61–1.24)
GFR, Estimated: >60 mL/min (ref >60)
Glucose, Bld: 203 mg/dL — ABNORMAL HIGH (ref 70–99)
Potassium: 3.4 mmol/L — ABNORMAL LOW (ref 3.5–5.1)
Sodium: 136 mmol/L (ref 135–145)

## 2023-04-06 LAB — CBC
HCT: 36.5 % — ABNORMAL LOW (ref 39.0–52.0)
Hemoglobin: 11.8 g/dL — ABNORMAL LOW (ref 13.0–17.0)
MCH: 29.3 pg (ref 26.0–34.0)
MCHC: 32.3 g/dL (ref 30.0–36.0)
MCV: 90.6 fL (ref 80.0–100.0)
Platelets: 161 10*3/uL (ref 150–400)
RBC: 4.03 MIL/uL — ABNORMAL LOW (ref 4.22–5.81)
RDW: 16.3 % — ABNORMAL HIGH (ref 11.5–15.5)
WBC: 13.4 10*3/uL — ABNORMAL HIGH (ref 4.0–10.5)
nRBC: 0 % (ref 0.0–0.2)

## 2023-04-06 LAB — MAGNESIUM: Magnesium: 1.6 mg/dL — ABNORMAL LOW (ref 1.7–2.4)

## 2023-04-06 MED ORDER — MAGNESIUM SULFATE 2 GM/50ML IV SOLN
2.0000 g | Freq: Once | INTRAVENOUS | Status: AC
  Administered 2023-04-06: 2 g via INTRAVENOUS
  Filled 2023-04-06: qty 50

## 2023-04-06 MED ORDER — ATROPINE SULFATE 1 MG/ML IV SOLN
0.5000 mg | Freq: Once | INTRAVENOUS | Status: AC
  Administered 2023-04-06: 0.5 mg via INTRAVENOUS
  Filled 2023-04-06: qty 1

## 2023-04-06 MED ORDER — HEPARIN SOD (PORK) LOCK FLUSH 100 UNIT/ML IV SOLN
500.0000 [IU] | Freq: Once | INTRAVENOUS | Status: AC | PRN
Start: 2023-04-06 — End: 2023-04-06
  Administered 2023-04-06: 500 [IU]

## 2023-04-06 MED ORDER — SODIUM CHLORIDE 0.9% FLUSH
10.0000 mL | Freq: Once | INTRAVENOUS | Status: AC | PRN
  Administered 2023-04-06: 10 mL

## 2023-04-06 MED ORDER — POTASSIUM CHLORIDE IN NACL 20-0.9 MEQ/L-% IV SOLN
Freq: Once | INTRAVENOUS | Status: AC
  Filled 2023-04-06: qty 1000

## 2023-04-06 NOTE — Progress Notes (Signed)
 Patient came in today for fluids and nausea meds if needed. Labs drawn per MD.   Hydration fluids given per orders. Patient tolerated it well without problems. Vitals stable and discharged home from clinic ambulatory. Follow up as scheduled.

## 2023-04-06 NOTE — Telephone Encounter (Signed)
 Patient called to advise that he has felt weak and not eating or drinking like he should, secondary to abdominal pain since last treatment.  He said he feels like he needs fluids. Stuffy nose, no cough or fever. Per Dr. Ellin Saba, will bring him in for house fluids.

## 2023-04-06 NOTE — Patient Instructions (Signed)
 [facility] [place] - [facility]  Discharge Instructions: Thank you for choosing [facility] to provide your oncology and hematology care.  If you have a lab appointment with the Cancer Center - please note that after April 8th, 2024, all labs will be drawn in the cancer center.  You do not have to check in or register with the main entrance as you have in the past but will complete your check-in in the cancer center.  Wear comfortable clothing and clothing appropriate for easy access to any Portacath or PICC line.   We strive to give you quality time with your provider. You may need to reschedule your appointment if you arrive late (15 or more minutes).  Arriving late affects you and other patients whose appointments are after yours.  Also, if you miss three or more appointments without notifying the office, you may be dismissed from the clinic at the provider's discretion.      For prescription refill requests, have your pharmacy contact our office and allow 72 hours for refills Today you received the following : hydration fluids with K+ and Magnesium, has labs drawn per MD.    To help prevent nausea and vomiting after your treatment, we encourage you to take your nausea medication as directed.  BELOW ARE SYMPTOMS THAT SHOULD BE REPORTED IMMEDIATELY: *FEVER GREATER THAN 100.4 F (38 C) OR HIGHER *CHILLS OR SWEATING *NAUSEA AND VOMITING THAT IS NOT CONTROLLED WITH YOUR NAUSEA MEDICATION *UNUSUAL SHORTNESS OF BREATH *UNUSUAL BRUISING OR BLEEDING *URINARY PROBLEMS (pain or burning when urinating, or frequent urination) *BOWEL PROBLEMS (unusual diarrhea, constipation, pain near the anus) TENDERNESS IN MOUTH AND THROAT WITH OR WITHOUT PRESENCE OF ULCERS (sore throat, sores in mouth, or a toothache) UNUSUAL RASH, SWELLING OR PAIN  UNUSUAL VAGINAL DISCHARGE OR ITCHING   Items with * indicate a potential emergency and should be followed up as soon  as possible or go to the Emergency Department if any problems should occur.  Please show the CHEMOTHERAPY ALERT CARD or IMMUNOTHERAPY ALERT CARD at check-in to the Emergency Department and triage nurse.  Should you have questions after your visit or need to cancel or reschedule your appointment, please contact [facility] CANCER CTR [place] - A DEPT OF Eligha Bridegroom [facility] [phone]  and follow the prompts.  Office hours are 8:00 a.m. to 4:30 p.m. Monday - Friday. Please note that voicemails left after 4:00 p.m. may not be returned until the following business day.  We are closed weekends and major holidays. You have access to a nurse at all times for urgent questions. Please call the main number to the clinic [phone] and follow the prompts.  For any non-urgent questions, you may also contact your provider using MyChart. We now offer e-Visits for anyone 51 and older to request care online for non-urgent symptoms. For details visit [email or web address].   Also download the MyChart app! Go to the app store, search "MyChart", open the app, select [place], and log in with your MyChart username and password.

## 2023-04-13 ENCOUNTER — Encounter (HOSPITAL_COMMUNITY)
Admission: RE | Admit: 2023-04-13 | Discharge: 2023-04-13 | Disposition: A | Discharge status: Home / Self Care | Payer: Medicare Other | Source: Ambulatory Visit | Attending: Hematology | Admitting: Hematology

## 2023-04-13 DIAGNOSIS — C155 Malignant neoplasm of lower third of esophagus: Secondary | ICD-10-CM | POA: Diagnosis present

## 2023-04-13 LAB — GLUCOSE, CAPILLARY: Glucose-Capillary: 211 mg/dL — ABNORMAL HIGH (ref 70–99)

## 2023-04-13 MED ORDER — FLUDEOXYGLUCOSE F - 18 (FDG) INJECTION
10.9500 | Freq: Once | INTRAVENOUS | Status: AC
  Administered 2023-04-13: 10.95 via INTRAVENOUS

## 2023-04-15 ENCOUNTER — Other Ambulatory Visit: Payer: Self-pay | Admitting: Hematology

## 2023-04-17 ENCOUNTER — Telehealth: Payer: Medicare Other | Admitting: Thoracic Surgery (Cardiothoracic Vascular Surgery)
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
## 2023-04-20 ENCOUNTER — Inpatient Hospital Stay: Payer: Medicare Other | Attending: Hematology | Admitting: Hematology

## 2023-04-20 VITALS — BP 148/93 | HR 72 | Temp 97.7°F | Resp 18 | Wt 230.2 lb

## 2023-04-20 DIAGNOSIS — I10 Essential (primary) hypertension: Secondary | ICD-10-CM | POA: Insufficient documentation

## 2023-04-20 DIAGNOSIS — Z79899 Other long term (current) drug therapy: Secondary | ICD-10-CM | POA: Diagnosis not present

## 2023-04-20 DIAGNOSIS — Z7984 Long term (current) use of oral hypoglycemic drugs: Secondary | ICD-10-CM | POA: Insufficient documentation

## 2023-04-20 DIAGNOSIS — C155 Malignant neoplasm of lower third of esophagus: Secondary | ICD-10-CM | POA: Insufficient documentation

## 2023-04-20 DIAGNOSIS — E119 Type 2 diabetes mellitus without complications: Secondary | ICD-10-CM | POA: Insufficient documentation

## 2023-04-20 NOTE — Patient Instructions (Addendum)
 [facility] Discharge Instructions   You were seen and examined today by Dr. Ellin Saba.  He reviewed the results of your lab work which are normal/stable.   He reviewed the images of your PET scan. We are awaiting the radiologist to read the PET. We will call you with the results once it is read.   We will see you back after surgery. We will give four more rounds of chemo after surgery. Call us to let us know your surgery date.   Return as scheduled.    Thank you for choosing [facility] to provide your oncology and hematology care.  To afford each patient quality time with our provider, please arrive at least 15 minutes before your scheduled appointment time.   If you have a lab appointment with the Cancer Center please come in thru the Main Entrance and check in at the main information desk.  You need to re-schedule your appointment should you arrive 10 or more minutes late.  We strive to give you quality time with our providers, and arriving late affects you and other patients whose appointments are after yours.  Also, if you no show three or more times for appointments you may be dismissed from the clinic at the providers discretion.     Again, thank you for choosing [facility].  Our hope is that these requests will decrease the amount of time that you wait before being seen by our physicians.       _____________________________________________________________  Should you have questions after your visit to [facility], please contact our office at [phone] and follow the prompts.  Our office hours are 8:00 a.m. and 4:30 p.m. Monday - Friday.  Please note that voicemails left after 4:00 p.m. may not be returned until the following business day.  We are closed weekends and major holidays.  You do have access to a nurse 24-7, just call the main number to the clinic [phone] and do  not press any options, hold on the line and a nurse will answer the phone.    For prescription refill requests, have your pharmacy contact our office and allow 72 hours.    Due to Covid, you will need to wear a mask upon entering the hospital. If you do not have a mask, a mask will be given to you at the Main Entrance upon arrival. For doctor visits, patients may have 1 support person age 71 or older with them. For treatment visits, patients can not have anyone with them due to social distancing guidelines and our immunocompromised population.

## 2023-04-22 ENCOUNTER — Encounter: Payer: Self-pay | Admitting: Hematology

## 2023-05-05 ENCOUNTER — Other Ambulatory Visit: Payer: Self-pay | Admitting: Hematology

## 2023-05-13 ENCOUNTER — Ambulatory Visit (INDEPENDENT_AMBULATORY_CARE_PROVIDER_SITE_OTHER): Payer: Medicare Other

## 2023-05-13 VITALS — BP 127/78 | HR 78 | Ht 72.0 in | Wt 225.0 lb

## 2023-05-13 DIAGNOSIS — Z0000 Encounter for general adult medical examination without abnormal findings: Secondary | ICD-10-CM | POA: Diagnosis not present

## 2023-05-13 NOTE — Patient Instructions (Signed)
 Samuel Hubbard , Thank you for taking time to come for your Medicare Wellness Visit. I appreciate your ongoing commitment to your health goals. Please review the following plan we discussed and let me know if I can assist you in the future.   Referrals/Orders/Follow-Ups/Clinician Recommendations:  Next Medicare Annual Wellness Visit:   May 16, 2024 at 10:00 am video visit.      This is a list of the screening recommended for you and due dates:  Health Maintenance  Topic Date Due   Zoster (Shingles) Vaccine (1 of 2) Never done   Pneumonia Vaccine (2 of 2 - PPSV23 or PCV20) 12/08/2018   Hemoglobin A1C  06/01/2023   Eye exam for diabetics  10/23/2023   Yearly kidney health urinalysis for diabetes  12/01/2023   Complete foot exam   12/01/2023   Colon Cancer Screening  01/01/2024   Yearly kidney function blood test for diabetes  04/05/2024   Medicare Annual Wellness Visit  05/12/2024   DTaP/Tdap/Td vaccine (2 - Td or Tdap) 07/01/2028   Flu Shot  Completed   Hepatitis C Screening  Completed   HPV Vaccine  Aged Out   COVID-19 Vaccine  Discontinued    Advanced directives: (Declined) Advance directive discussed with you today. Even though you declined this today, please call our office should you change your mind, and we can give you the proper paperwork for you to fill out.  Next Medicare Annual Wellness Visit scheduled for next year: yes  Understanding Your Risk for Falls Millions of people have serious injuries from falls each year. It is important to understand your risk of falling. Talk with your health care provider about your risk and what you can do to lower it. If you do have a serious fall, make sure to tell your provider. Falling once raises your risk of falling again. How can falls affect me? Serious injuries from falls are common. These include: Broken bones, such as hip fractures. Head injuries, such as traumatic brain injuries (TBI) or concussions. A fear of falling can  cause you to avoid activities and stay at home. This can make your muscles weaker and raise your risk for a fall. What can increase my risk? There are a number of risk factors that increase your risk for falling. The more risk factors you have, the higher your risk of falling. Serious injuries from a fall happen most often to people who are older than 67 years old. Teenagers and young adults ages 84-29 are also at higher risk. Common risk factors include: Weakness in the lower body. Being generally weak or confused due to long-term (chronic) illness. Dizziness or balance problems. Poor vision. Medicines that cause dizziness or drowsiness. These may include: Medicines for your blood pressure, heart, anxiety, insomnia, or swelling (edema). Pain medicines. Muscle relaxants. Other risk factors include: Drinking alcohol. Having had a fall in the past. Having foot pain or wearing improper footwear. Working at a dangerous job. Having any of the following in your home: Tripping hazards, such as floor clutter or loose rugs. Poor lighting. Pets. Having dementia or memory loss. What actions can I take to lower my risk of falling?     Physical activity Stay physically fit. Do strength and balance exercises. Consider taking a regular class to build strength and balance. Yoga and tai chi are good options. Vision Have your eyes checked every year and your prescription for glasses or contacts updated as needed. Shoes and walking aids Wear non-skid shoes. Wear shoes  that have rubber soles and low heels. Do not wear high heels. Do not walk around the house in socks or slippers. Use a cane or walker as told by your provider. Home safety Attach secure railings on both sides of your stairs. Install grab bars for your bathtub, shower, and toilet. Use a non-skid mat in your bathtub or shower. Attach bath mats securely with double-sided, non-slip rug tape. Use good lighting in all rooms. Keep a  flashlight near your bed. Make sure there is a clear path from your bed to the bathroom. Use night-lights. Do not use throw rugs. Make sure all carpeting is taped or tacked down securely. Remove all clutter from walkways and stairways, including extension cords. Repair uneven or broken steps and floors. Avoid walking on icy or slippery surfaces. Walk on the grass instead of on icy or slick sidewalks. Use ice melter to get rid of ice on walkways in the winter. Use a cordless phone. Questions to ask your health care provider Can you help me check my risk for a fall? Do any of my medicines make me more likely to fall? Should I take a vitamin D supplement? What exercises can I do to improve my strength and balance? Should I make an appointment to have my vision checked? Do I need a bone density test to check for weak bones (osteoporosis)? Would it help to use a cane or a walker? Where to find more information Centers for Disease Control and Prevention, STEADI: TonerPromos.no Community-Based Fall Prevention Programs: TonerPromos.no [employer] on Aging: BaseRingTones.pl Contact a health care provider if: You fall at home. You are afraid of falling at home. You feel weak, drowsy, or dizzy. This information is not intended to replace advice given to you by your health care provider. Make sure you discuss any questions you have with your health care provider. Document Revised: 10/07/2021 Document Reviewed: 10/07/2021 Elsevier Patient Education  2024 [employer].
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
## 2023-05-14 ENCOUNTER — Other Ambulatory Visit: Payer: Self-pay

## 2023-05-14 ENCOUNTER — Other Ambulatory Visit: Payer: Self-pay | Admitting: *Deleted

## 2023-05-14 ENCOUNTER — Other Ambulatory Visit: Payer: Self-pay | Admitting: Internal Medicine

## 2023-05-14 MED ORDER — HYDROCODONE-ACETAMINOPHEN 5-325 MG PO TABS
1.0000 | ORAL_TABLET | Freq: Three times a day (TID) | ORAL | 0 refills | Status: DC | PRN
Start: 2023-05-14 — End: 2023-06-01

## 2023-06-01 ENCOUNTER — Ambulatory Visit: Payer: Medicare Other | Admitting: Internal Medicine

## 2023-06-01 ENCOUNTER — Encounter: Payer: Self-pay | Admitting: Internal Medicine

## 2023-06-01 ENCOUNTER — Other Ambulatory Visit: Payer: Self-pay | Admitting: Hematology

## 2023-06-01 ENCOUNTER — Emergency Department (HOSPITAL_COMMUNITY)
Admission: EM | Admit: 2023-06-01 | Discharge: 2023-06-01 | Discharge status: Against Medical Advice | Source: Home / Self Care

## 2023-06-01 VITALS — BP 108/74 | HR 106 | Ht 72.0 in | Wt 221.2 lb

## 2023-06-01 DIAGNOSIS — E1159 Type 2 diabetes mellitus with other circulatory complications: Secondary | ICD-10-CM

## 2023-06-01 DIAGNOSIS — E1169 Type 2 diabetes mellitus with other specified complication: Secondary | ICD-10-CM | POA: Diagnosis not present

## 2023-06-01 DIAGNOSIS — E039 Hypothyroidism, unspecified: Secondary | ICD-10-CM

## 2023-06-01 DIAGNOSIS — C155 Malignant neoplasm of lower third of esophagus: Secondary | ICD-10-CM | POA: Diagnosis not present

## 2023-06-01 DIAGNOSIS — I480 Paroxysmal atrial fibrillation: Secondary | ICD-10-CM | POA: Diagnosis not present

## 2023-06-01 DIAGNOSIS — I10 Essential (primary) hypertension: Secondary | ICD-10-CM

## 2023-06-01 DIAGNOSIS — Z7984 Long term (current) use of oral hypoglycemic drugs: Secondary | ICD-10-CM

## 2023-06-01 DIAGNOSIS — E559 Vitamin D deficiency, unspecified: Secondary | ICD-10-CM

## 2023-06-01 MED ORDER — APIXABAN 5 MG PO TABS: 5.0000 mg | ORAL_TABLET | Freq: Two times a day (BID) | ORAL | 2 refills | Status: DC

## 2023-06-01 MED ORDER — METOPROLOL TARTRATE 25 MG PO TABS: 25.0000 mg | ORAL_TABLET | Freq: Two times a day (BID) | ORAL | 90 refills | Status: DC
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
## 2023-06-01 NOTE — Patient Instructions (Addendum)
 It was a pleasure to see you today.  Thank you for giving [place] the opportunity to be involved in your care.  Below is a brief recap of your visit and next steps.  We will plan to see you again in 3 months.  Summary No medication changes today Repeat labs ordered Follow up in 3 months

## 2023-06-02 ENCOUNTER — Inpatient Hospital Stay

## 2023-06-02 ENCOUNTER — Telehealth: Payer: Self-pay | Admitting: *Deleted

## 2023-06-02 ENCOUNTER — Inpatient Hospital Stay: Attending: Hematology

## 2023-06-02 ENCOUNTER — Other Ambulatory Visit: Payer: Self-pay | Admitting: *Deleted

## 2023-06-02 VITALS — BP 116/78 | Resp 18

## 2023-06-02 VITALS — BP 109/68 | HR 93 | Resp 18

## 2023-06-02 DIAGNOSIS — C155 Malignant neoplasm of lower third of esophagus: Secondary | ICD-10-CM | POA: Insufficient documentation

## 2023-06-02 DIAGNOSIS — Z7984 Long term (current) use of oral hypoglycemic drugs: Secondary | ICD-10-CM | POA: Insufficient documentation

## 2023-06-02 DIAGNOSIS — I4891 Unspecified atrial fibrillation: Secondary | ICD-10-CM | POA: Insufficient documentation

## 2023-06-02 DIAGNOSIS — I10 Essential (primary) hypertension: Secondary | ICD-10-CM | POA: Diagnosis not present

## 2023-06-02 DIAGNOSIS — Z5111 Encounter for antineoplastic chemotherapy: Secondary | ICD-10-CM | POA: Insufficient documentation

## 2023-06-02 DIAGNOSIS — D649 Anemia, unspecified: Secondary | ICD-10-CM | POA: Diagnosis not present

## 2023-06-02 DIAGNOSIS — E119 Type 2 diabetes mellitus without complications: Secondary | ICD-10-CM | POA: Insufficient documentation

## 2023-06-02 DIAGNOSIS — Z7901 Long term (current) use of anticoagulants: Secondary | ICD-10-CM | POA: Insufficient documentation

## 2023-06-02 DIAGNOSIS — Z79899 Other long term (current) drug therapy: Secondary | ICD-10-CM | POA: Insufficient documentation

## 2023-06-02 DIAGNOSIS — R531 Weakness: Secondary | ICD-10-CM

## 2023-06-02 DIAGNOSIS — Z95828 Presence of other vascular implants and grafts: Secondary | ICD-10-CM

## 2023-06-02 LAB — CBC WITH DIFFERENTIAL/PLATELET
Abs Immature Granulocytes: 0.01 10*3/uL (ref 0.00–0.07)
Basophils Absolute: 0 10*3/uL (ref 0.0–0.1)
Basophils Relative: 0 %
Eosinophils Absolute: 0.1 10*3/uL (ref 0.0–0.5)
Eosinophils Relative: 1 %
HCT: 34.7 % — ABNORMAL LOW (ref 39.0–52.0)
Hemoglobin: 10.8 g/dL — ABNORMAL LOW (ref 13.0–17.0)
Immature Granulocytes: 0 %
Lymphocytes Relative: 28 %
Lymphs Abs: 1 10*3/uL (ref 0.7–4.0)
MCH: 28.8 pg (ref 26.0–34.0)
MCHC: 31.1 g/dL (ref 30.0–36.0)
MCV: 92.5 fL (ref 80.0–100.0)
Monocytes Absolute: 0.3 10*3/uL (ref 0.1–1.0)
Monocytes Relative: 7 %
Neutro Abs: 2.2 10*3/uL (ref 1.7–7.7)
Neutrophils Relative %: 64 %
Platelets: 166 10*3/uL (ref 150–400)
RBC: 3.75 MIL/uL — ABNORMAL LOW (ref 4.22–5.81)
RDW: 13.2 % (ref 11.5–15.5)
WBC: 3.5 10*3/uL — ABNORMAL LOW (ref 4.0–10.5)
nRBC: 0 % (ref 0.0–0.2)

## 2023-06-02 LAB — COMPREHENSIVE METABOLIC PANEL WITH GFR
ALT: 24 U/L (ref 0–44)
AST: 20 U/L (ref 15–41)
Albumin: 3.2 g/dL — ABNORMAL LOW (ref 3.5–5.0)
Alkaline Phosphatase: 67 U/L (ref 38–126)
Anion gap: 8 (ref 5–15)
BUN: 19 mg/dL (ref 8–23)
CO2: 25 mmol/L (ref 22–32)
Calcium: 8.4 mg/dL — ABNORMAL LOW (ref 8.9–10.3)
Chloride: 99 mmol/L (ref 98–111)
Creatinine, Ser: 0.97 mg/dL (ref 0.61–1.24)
GFR, Estimated: >60 mL/min (ref >60)
Glucose, Bld: 269 mg/dL — ABNORMAL HIGH (ref 70–99)
Potassium: 3.8 mmol/L (ref 3.5–5.1)
Sodium: 132 mmol/L — ABNORMAL LOW (ref 135–145)
Total Bilirubin: 0.5 mg/dL (ref 0.0–1.2)
Total Protein: 6.2 g/dL — ABNORMAL LOW (ref 6.5–8.1)

## 2023-06-02 LAB — MAGNESIUM: Magnesium: 1.7 mg/dL (ref 1.7–2.4)

## 2023-06-02 MED ORDER — APIXABAN 5 MG PO TABS: 5.0000 mg | ORAL_TABLET | Freq: Two times a day (BID) | ORAL | Status: DC

## 2023-06-02 MED ORDER — ALUMINUM & MAGNESIUM HYDROXIDE 200-200 MG/5ML PO SUSP: 15.0000 mL | Freq: Four times a day (QID) | ORAL | 0 refills | Status: DC | PRN

## 2023-06-02 MED ORDER — SODIUM CHLORIDE FLUSH 0.9 % IV SOLN
10.0000 mL | Freq: Once | INTRAVENOUS | Status: AC
  Administered 2023-06-02: 10 mL via INTRAVENOUS
  Filled 2023-06-02: qty 10

## 2023-06-02 MED ORDER — LIDOCAINE VISCOUS HCL 2 % MT SOLN: 15.0000 mL | Freq: Four times a day (QID) | OROMUCOSAL | 0 refills | Status: DC | PRN

## 2023-06-02 MED ORDER — SODIUM CHLORIDE 0.9% FLUSH
10.0000 mL | INTRAVENOUS | Status: DC | PRN
Start: 2023-06-02 — End: 2023-06-02
  Administered 2023-06-02: 10 mL via INTRAVENOUS

## 2023-06-02 MED ORDER — HEPARIN SOD (PORK) LOCK FLUSH 100 UNIT/ML IV SOLN
500.0000 [IU] | Freq: Once | INTRAVENOUS | Status: AC
Start: 2023-06-02 — End: 2023-06-02
  Administered 2023-06-02: 500 [IU] via INTRAVENOUS

## 2023-06-02 MED ORDER — SODIUM CHLORIDE 0.9 % IV SOLN: INTRAVENOUS | Status: DC

## 2023-06-02 NOTE — Assessment & Plan Note (Signed)
 A1c 8.7 on labs from October 2024.  He is currently prescribed metformin 1000 mg twice daily.  Off glipizide and Jardiance while undergoing chemotherapy.  Repeat A1c ordered today.

## 2023-06-02 NOTE — Progress Notes (Signed)
 Care Guide Pharmacy Note  06/02/2023 Name: Samuel Hubbard MRN: [id number] DOB: 02-17-1956  Referred By: Tobi Fortes, MD Reason for referral: Complex Care Management (Initial outreach to schedule referral with PharmD Kary Pages)   Samuel Hubbard is a 68 y.o. year old male who is a primary care patient of Tobi Fortes, MD.  Samuel Hubbard was referred to the pharmacist for assistance related to: Atrial Fibrillation and DMII  Successful contact was made with the patient to discuss pharmacy services including being ready for the pharmacist to call at least 5 minutes before the scheduled appointment time and to have medication bottles and any blood pressure readings ready for review. The patient agreed to meet with the pharmacist via telephone visit on (date/time). 4/18 at 1:00 PM   [name]  [facility], [facility] Guide  Direct Dial: [phone]  Fax [phone]

## 2023-06-02 NOTE — Progress Notes (Signed)
 Patient stated he had an appointment with PCP yesterday and suggested he come to the ER for fluids due to vital signs, dizziness, and dehydration.  Patient went to the ER but left due to ER wait time.  He  had his esophagus previously stretched by [facility] and having pain and having difficulty swallowing medications.  Taking Tylenol by mouth.  Offered to ask for a liquid pain medicine but declined.  He has an appointment this Thursday at [facility] and stated he would ask them for pain medication.  PRN diarrhea this morning and did not use Imodium.  Labs reviewed and vital signs.  Normal saline 1000 ml over 2 hours verbal order Dr. Cheree Cords.   Patient tolerated hydration with no complaints voiced.  Port site clean and dry with good blood return noted before and after hydration.  No bruising or swelling noted with port.  Band aid applied.  VSS with discharge and left ambulatory with no s/s of distress noted.

## 2023-06-02 NOTE — Assessment & Plan Note (Signed)
 Adequately controlled with amlodipine and olmesartan.

## 2023-06-02 NOTE — Assessment & Plan Note (Addendum)
 Stage III adenocarcinoma of the lower third of the esophagus.  He completed FLOT chemotherapy and recently underwent repeat EGD with dilation.  He will follow-up with cardiothoracic surgery and oncology at [facility] on 4/17 to discuss additional treatment.  He currently endorses dizziness and fatigue.  Symptoms began today.  ECG not consistent with atrial fibrillation.  He endorses orthostasis.  Oral intake has been poor recently.  I am concerned that he is dehydrated and would benefit from IV fluid administration.  We attempted to contact the infusion center to have him present for IV fluids but unfortunately it was too late in the afternoon.  I recommended presenting to the emergency department for further evaluation and treatment based on his symptoms.  He did not feel safe driving home and his wife came to pick him up.  Both Samuel Hubbard and his wife were in agreement with the ED presentation.  I spoke with a provider in the emergency department to make them aware of Samuel Hubbard impending arrival and my concerns.

## 2023-06-02 NOTE — Assessment & Plan Note (Signed)
 Tachycardic on exam and endorsing dizziness.  ECG obtained in office today shows normal sinus rhythm.  He is currently prescribed Eliquis and metoprolol tartrate.  He reports difficulty with affording Eliquis.  Samples provided today and will place referral to clinical pharmacy for assistance.

## 2023-06-02 NOTE — Progress Notes (Signed)
Patients port flushed without difficulty.  Good blood return noted with no bruising or swelling noted at site.  Patient remains accessed for possible IVF.

## 2023-06-02 NOTE — Assessment & Plan Note (Signed)
 Currently prescribed levothyroxine 224 mcg daily.  TSH within normal limits when last updated in October 2024.  Repeat TFTs ordered today.

## 2023-06-02 NOTE — Addendum Note (Signed)
 Addended by: Kee Pastel D on: 06/02/2023 08:54 AM   Modules accepted: Orders

## 2023-06-02 NOTE — Telephone Encounter (Signed)
 Patient reported to ER last evening for weakness and headache, associated with mild dizziness.  Wait was extensive and patient left without treatment.  Requested to come in for lab and fluids, as he feels as if he is dehydrated.  On schedule for 10:30 today.

## 2023-06-02 NOTE — Patient Instructions (Signed)

## 2023-06-02 NOTE — Progress Notes (Signed)
 Care Hubbard Pharmacy Note  06/02/2023 Name: Samuel Hubbard MRN: [id number] DOB: December 10, 1955  Referred By: Tobi Fortes, MD Reason for referral: Complex Care Management (Initial outreach to schedule referral with PharmD Kary Pages)   Samuel Hubbard is a 68 y.o. year old male who is a primary care patient of Kermit Ped, Heath Litten, MD.  Samuel Hubbard was referred to the pharmacist for assistance related to: Atrial Fibrillation and DMII  An unsuccessful telephone outreach was attempted today to contact the patient who was referred to the pharmacy team for assistance with medication management. Additional attempts will be made to contact the patient.  Samuel Hubbard  Samuel Hubbard  Samuel Hubbard, Samuel Hubbard  Direct Dial: [phone]  Fax [phone]

## 2023-06-05 ENCOUNTER — Other Ambulatory Visit: Payer: Self-pay

## 2023-06-05 NOTE — Progress Notes (Signed)
 06/05/2023  Patient ID: Samuel Hubbard, male   DOB: 1955/09/07, 68 y.o.   MRN: [id number]    06/05/2023 Name: Samuel Hubbard MRN: [id number] DOB: 1956-01-24  Chief Complaint  Patient presents with   Medication Management    Samuel Hubbard is a 68 y.o. year old male who presented for a telephone visit.   They were referred to the pharmacist by their PCP for assistance in managing medication access.    Subjective:  Care Team: Primary Care Provider: Tobi Fortes, MD ; Next Scheduled Visit:   Future Appointments  Date Time Provider Department Center  05/16/2024 10:00 AM R[facility]-ANNUAL WELLNESS VISIT [facility]     Medication Access/Adherence  Current Pharmacy:  [facility] [place], [place] - [address] [place] [address] Phone: [phone] Fax: [phone]   Patient reports affordability concerns with their medications: Yes  Patient reports access/transportation concerns to their pharmacy: No  Patient reports adherence concerns with their medications:  No     Medication Management:  Current adherence strategy:   Patient reports the following barriers to adherence: cost associated with eliquis    Recent fill dates:    Objective:  Lab Results  Component Value Date   HGBA1C 8.7 (H) 12/01/2022    Lab Results  Component Value Date   CREATININE 0.97 06/02/2023   BUN 19 06/02/2023   NA 132 (L) 06/02/2023   K 3.8 06/02/2023   CL 99 06/02/2023   CO2 25 06/02/2023    Lab Results  Component Value Date   CHOL 139 12/01/2022   HDL 52 12/01/2022   LDLCALC 64 12/01/2022   TRIG 132 12/01/2022   CHOLHDL 2.7 12/01/2022    Medications Reviewed Today     Reviewed by Rolando Cliche, [facility] (Pharmacist) on 06/05/23 at 1336  Med List Status: <None>   Medication Order Taking? Sig Documenting Provider Last Dose Status Informant  aluminum -magnesium  hydroxide 200-200 MG/5ML suspension [id number]  Take 15 mLs by mouth every 6 (six) hours as  needed for indigestion (Mix 1:1 with lidocain and swallow). Paulett Boros, MD  Active   amLODipine  (NORVASC ) 5 MG tablet [id number] No Take 1 tablet (5 mg total) by mouth daily. Mallipeddi, Vishnu P, MD Taking Expired 05/13/23 2359   apixaban  (ELIQUIS ) 5 MG TABS tablet [id number]  Take 1 tablet (5 mg total) by mouth 2 (two) times daily. Tobi Fortes, MD  Active   atorvastatin  (LIPITOR) 40 MG tablet [id number] No TAKE 1 TABLET BY MOUTH EVERY DAY Tobi Fortes, MD Taking Active   blood glucose meter kit and supplies [id number] No Dispense based on patient and insurance preference. Once daily testing DX E11.9  Patient not taking: Reported on 06/01/2023   Paseda, Folashade R, FNP Not Taking Active   citalopram  (CELEXA ) 20 MG tablet [id number] No TAKE 1 TABLET BY MOUTH EVERY DAY Tobi Fortes, MD Taking Active   glucose blood test strip [id number] No Use as instructed  Patient not taking: Reported on 06/01/2023   Paseda, Folashade R, FNP Not Taking Active   HYDROcodone -acetaminophen  (NORCO/VICODIN) 5-325 MG tablet [id number] No Take 1 tablet by mouth every 8 (eight) hours as needed for moderate pain (pain score 4-6). Paulett Boros, MD Taking Active   Lancets 30G MISC [id number] No Once daily testing dx e11.9  Patient not taking: Reported on 06/01/2023   Paseda, Folashade R, FNP Not Taking Active   levothyroxine  (SYNTHROID ) 112 MCG tablet [id number] No TAKE 2 TABLETS BY MOUTH PRIOR TO  Morrell Aran, MD Taking Active   lidocaine  (XYLOCAINE ) 2 % solution [id number]  Use as directed 15 mLs in the mouth or throat every 6 (six) hours as needed for mouth pain (Mix 1:1 with Maalox and swallow). Katragadda, Sreedhar, MD  Active   lidocaine -prilocaine  (EMLA ) cream [id number] No Apply to affected area once Katragadda, Sreedhar, MD Taking Active   metFORMIN  (GLUCOPHAGE -XR) 500 MG 24 hr tablet [id number] No Take 2 tablets (1,000 mg total) by mouth 2 (two) times daily with a meal.  Patient  taking differently: Take 1,000 mg by mouth daily after breakfast.   Dixon, Phillip E, MD Taking Active   metoprolol  tartrate (LOPRESSOR ) 25 MG tablet [id number]  Take 1 tablet (25 mg total) by mouth 2 (two) times daily. Tobi Fortes, MD  Active   nitroGLYCERIN  (NITROSTAT ) 0.4 MG SL tablet [id number] No Place 1 tablet (0.4 mg total) under the tongue every 5 (five) minutes as needed for chest pain. Del Amber Bail, Rogerio Clay, FNP Taking Active   olmesartan  (BENICAR ) 40 MG tablet [id number] No TAKE 1 TABLET BY MOUTH EVERY DAY Tobi Fortes, MD Taking Active   pantoprazole  (PROTONIX ) 40 MG tablet [id number] No Take 40 mg by mouth daily.  Patient not taking: Reported on 06/01/2023   [provider] Not Taking Active               Assessment/Plan:   Medication Management: - Currently strategy insufficient to maintain appropriate adherence to prescribed medication regimen - Reviewed financial criteria for eliquis . Qualifies based on income for 2 person house, reports they have likely spent 3% of income on his medications YTD. We agreed to move forward with the application.     Follow Up Plan: pharmacist to coordinate medication assistance. No additional f/u at this time.   Rolando Cliche, PharmD, BCGP Clinical Pharmacist  [phone]

## 2023-06-09 ENCOUNTER — Other Ambulatory Visit (HOSPITAL_COMMUNITY): Payer: Self-pay

## 2023-06-09 ENCOUNTER — Telehealth: Payer: Self-pay

## 2023-06-09 ENCOUNTER — Encounter: Payer: Self-pay | Admitting: Hematology

## 2023-06-09 NOTE — Progress Notes (Signed)
 Pharmacy Medication Assistance Program Note    06/10/2023  Patient ID: Samuel Hubbard, male   DOB: January 25, 1956, 68 y.o.   MRN: [id number]     06/09/2023  Outreach Medication One  Manufacturer Medication One [employer]  [employer] Eliquis   Dose of Eliquis  5mg   Type of Radiographer, therapeutic Assistance  Date Application Sent to Patient 06/10/2023   Mailed to pt  Will attempt assistance. Many part D applicants have been denied due to needing to apply for medicare payment plan. Oop expenses are considered after this step.

## 2023-06-09 NOTE — Telephone Encounter (Signed)
-----   Message from Fredirick Jasmine sent at 06/09/2023  8:52 AM EDT -----  ----- Message ----- From: Rolando Cliche, [facility] Sent: 06/05/2023   1:40 PM EDT To: Rx Med Assistance Team  PAP [employer]: Please mail BMS program application to patient for eliquis  5mg  BID. Prescriber is Dr Saint Cranker at [facility].
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
## 2023-06-15 ENCOUNTER — Inpatient Hospital Stay

## 2023-06-15 ENCOUNTER — Inpatient Hospital Stay (HOSPITAL_BASED_OUTPATIENT_CLINIC_OR_DEPARTMENT_OTHER): Admitting: Hematology

## 2023-06-15 VITALS — BP 138/69 | HR 62 | Temp 98.2°F | Resp 17

## 2023-06-15 VITALS — BP 136/74 | HR 64 | Temp 97.9°F | Resp 18 | Ht 72.0 in | Wt 215.6 lb

## 2023-06-15 DIAGNOSIS — C155 Malignant neoplasm of lower third of esophagus: Secondary | ICD-10-CM

## 2023-06-15 DIAGNOSIS — Z5111 Encounter for antineoplastic chemotherapy: Secondary | ICD-10-CM | POA: Diagnosis not present

## 2023-06-15 DIAGNOSIS — Z95828 Presence of other vascular implants and grafts: Secondary | ICD-10-CM

## 2023-06-15 LAB — CBC WITH DIFFERENTIAL/PLATELET
Abs Immature Granulocytes: 0.02 10*3/uL (ref 0.00–0.07)
Basophils Absolute: 0 10*3/uL (ref 0.0–0.1)
Basophils Relative: 1 %
Eosinophils Absolute: 0.1 10*3/uL (ref 0.0–0.5)
Eosinophils Relative: 1 %
HCT: 35.1 % — ABNORMAL LOW (ref 39.0–52.0)
Hemoglobin: 10.9 g/dL — ABNORMAL LOW (ref 13.0–17.0)
Immature Granulocytes: 0 %
Lymphocytes Relative: 22 %
Lymphs Abs: 1.4 10*3/uL (ref 0.7–4.0)
MCH: 28.6 pg (ref 26.0–34.0)
MCHC: 31.1 g/dL (ref 30.0–36.0)
MCV: 92.1 fL (ref 80.0–100.0)
Monocytes Absolute: 0.6 10*3/uL (ref 0.1–1.0)
Monocytes Relative: 9 %
Neutro Abs: 4.2 10*3/uL (ref 1.7–7.7)
Neutrophils Relative %: 67 %
Platelets: 276 10*3/uL (ref 150–400)
RBC: 3.81 MIL/uL — ABNORMAL LOW (ref 4.22–5.81)
RDW: 13.2 % (ref 11.5–15.5)
WBC: 6.3 10*3/uL (ref 4.0–10.5)
nRBC: 0 % (ref 0.0–0.2)

## 2023-06-15 LAB — COMPREHENSIVE METABOLIC PANEL WITH GFR
ALT: 26 U/L (ref 0–44)
AST: 16 U/L (ref 15–41)
Albumin: 3.4 g/dL — ABNORMAL LOW (ref 3.5–5.0)
Alkaline Phosphatase: 79 U/L (ref 38–126)
Anion gap: 9 (ref 5–15)
BUN: 18 mg/dL (ref 8–23)
CO2: 27 mmol/L (ref 22–32)
Calcium: 9.1 mg/dL (ref 8.9–10.3)
Chloride: 102 mmol/L (ref 98–111)
Creatinine, Ser: 0.78 mg/dL (ref 0.61–1.24)
GFR, Estimated: >60 mL/min (ref >60)
Glucose, Bld: 182 mg/dL — ABNORMAL HIGH (ref 70–99)
Potassium: 3.7 mmol/L (ref 3.5–5.1)
Sodium: 138 mmol/L (ref 135–145)
Total Bilirubin: 0.6 mg/dL (ref 0.0–1.2)
Total Protein: 6.6 g/dL (ref 6.5–8.1)

## 2023-06-15 LAB — MAGNESIUM: Magnesium: 1.7 mg/dL (ref 1.7–2.4)

## 2023-06-15 MED ORDER — SODIUM CHLORIDE 0.9 % IV SOLN: INTRAVENOUS | Status: DC

## 2023-06-15 MED ORDER — SODIUM CHLORIDE 0.9 % IV SOLN
2600.0000 mg/m2 | INTRAVENOUS | Status: DC
  Administered 2023-06-15: 6050 mg via INTRAVENOUS
  Filled 2023-06-15: qty 121

## 2023-06-15 MED ORDER — PALONOSETRON HCL INJECTION 0.25 MG/5ML
0.2500 mg | Freq: Once | INTRAVENOUS | Status: AC
  Administered 2023-06-15: 0.25 mg via INTRAVENOUS
  Filled 2023-06-15: qty 5

## 2023-06-15 MED ORDER — OXALIPLATIN CHEMO INJECTION 100 MG/20ML
85.0000 mg/m2 | Freq: Once | INTRAVENOUS | Status: AC
  Administered 2023-06-15: 200 mg via INTRAVENOUS
  Filled 2023-06-15: qty 40

## 2023-06-15 MED ORDER — SODIUM CHLORIDE 0.9 % IV SOLN
50.0000 mg/m2 | Freq: Once | INTRAVENOUS | Status: AC
  Administered 2023-06-15: 116 mg via INTRAVENOUS
  Filled 2023-06-15: qty 11.6

## 2023-06-15 MED ORDER — SODIUM CHLORIDE 0.9 % IV SOLN: Freq: Once | INTRAVENOUS | Status: AC

## 2023-06-15 MED ORDER — LEUCOVORIN CALCIUM INJECTION 350 MG
200.0000 mg/m2 | Freq: Once | INTRAVENOUS | Status: AC
  Administered 2023-06-15: 464 mg via INTRAVENOUS
  Filled 2023-06-15: qty 23.2

## 2023-06-15 MED ORDER — DEXTROSE 5 % IV SOLN
INTRAVENOUS | Status: DC
Start: 2023-06-15 — End: 2023-06-15

## 2023-06-15 MED ORDER — SODIUM CHLORIDE 0.9 % IV SOLN
150.0000 mg | Freq: Once | INTRAVENOUS | Status: AC
  Administered 2023-06-15: 150 mg via INTRAVENOUS
  Filled 2023-06-15: qty 150

## 2023-06-15 MED ORDER — DEXAMETHASONE SODIUM PHOSPHATE 10 MG/ML IJ SOLN
10.0000 mg | Freq: Once | INTRAMUSCULAR | Status: AC
  Administered 2023-06-15: 10 mg via INTRAVENOUS
  Filled 2023-06-15: qty 1

## 2023-06-15 NOTE — Progress Notes (Signed)
 Patient has been examined by Dr. Ellin Saba. Vital signs and labs have been reviewed by MD - ANC, Creatinine, LFTs, hemoglobin, and platelets are within treatment parameters per M.D. - pt may proceed with treatment.  Primary RN and pharmacy notified.

## 2023-06-15 NOTE — Patient Instructions (Signed)

## 2023-06-15 NOTE — Progress Notes (Signed)
 Patient presents today for FLOT infusion. Patient is in satisfactory condition with no new complaints voiced.  Vital signs are stable.  Labs reviewed by Dr. Cheree Cords during the office visit and all labs are within treatment parameters. Patient will receive 500 mL of normal saline over 1 hour. We will proceed with treatment per MD orders.   Treatment given today per MD orders. Tolerated infusion without adverse affects. Vital signs stable. No complaints at this time. Discharged from clinic ambulatory in stable condition. Alert and oriented x 3. F/U with [facility] as scheduled. 5FU ambulatory pump infusing with no alarms beeping.

## 2023-06-15 NOTE — Patient Instructions (Signed)
 [facility] - A DEPT OF [facility]  Discharge Instructions: Thank you for choosing [facility] to provide your oncology and hematology care.  If you have a lab appointment with the Cancer Center - please note that after April 8th, 2024, all labs will be drawn in the cancer center.  You do not have to check in or register with the main entrance as you have in the past but will complete your check-in in the cancer center.  Wear comfortable clothing and clothing appropriate for easy access to any Portacath or PICC line.   We strive to give you quality time with your provider. You may need to reschedule your appointment if you arrive late (15 or more minutes).  Arriving late affects you and other patients whose appointments are after yours.  Also, if you miss three or more appointments without notifying the office, you may be dismissed from the clinic at the provider's discretion.      For prescription refill requests, have your pharmacy contact our office and allow 72 hours for refills to be completed.    Today you received the following chemotherapy and/or immunotherapy agents Docetaxel ,Oxaliplatin , Leucovorin , and 5FU    To help prevent nausea and vomiting after your treatment, we encourage you to take your nausea medication as directed.  BELOW ARE SYMPTOMS THAT SHOULD BE REPORTED IMMEDIATELY: *FEVER GREATER THAN 100.4 F (38 C) OR HIGHER *CHILLS OR SWEATING *NAUSEA AND VOMITING THAT IS NOT CONTROLLED WITH YOUR NAUSEA MEDICATION *UNUSUAL SHORTNESS OF BREATH *UNUSUAL BRUISING OR BLEEDING *URINARY PROBLEMS (pain or burning when urinating, or frequent urination) *BOWEL PROBLEMS (unusual diarrhea, constipation, pain near the anus) TENDERNESS IN MOUTH AND THROAT WITH OR WITHOUT PRESENCE OF ULCERS (sore throat, sores in mouth, or a toothache) UNUSUAL RASH, SWELLING OR PAIN  UNUSUAL VAGINAL DISCHARGE OR ITCHING   Items with * indicate a potential  emergency and should be followed up as soon as possible or go to the Emergency Department if any problems should occur.  Please show the CHEMOTHERAPY ALERT CARD or IMMUNOTHERAPY ALERT CARD at check-in to the Emergency Department and triage nurse.  Should you have questions after your visit or need to cancel or reschedule your appointment, please contact [facility] [place] - A DEPT OF [facility] [phone]  and follow the prompts.  Office hours are 8:00 a.m. to 4:30 p.m. Monday - Friday. Please note that voicemails left after 4:00 p.m. may not be returned until the following business day.  We are closed weekends and major holidays. You have access to a nurse at all times for urgent questions. Please call the main number to the clinic [phone] and follow the prompts.  For any non-urgent questions, you may also contact your provider using MyChart. We now offer e-Visits for anyone 87 and older to request care online for non-urgent symptoms. For details visit [email or web address].   Also download the MyChart app! Go to the app store, search "MyChart", open the app, select [facility], and log in with your MyChart username and password.

## 2023-06-16 ENCOUNTER — Inpatient Hospital Stay

## 2023-06-16 VITALS — BP 107/65 | HR 91 | Temp 97.9°F | Resp 20

## 2023-06-16 DIAGNOSIS — C155 Malignant neoplasm of lower third of esophagus: Secondary | ICD-10-CM

## 2023-06-16 DIAGNOSIS — Z5111 Encounter for antineoplastic chemotherapy: Secondary | ICD-10-CM | POA: Diagnosis not present

## 2023-06-16 MED ORDER — SODIUM CHLORIDE 0.9% FLUSH
10.0000 mL | INTRAVENOUS | Status: DC | PRN
  Administered 2023-06-16: 10 mL

## 2023-06-16 MED ORDER — HEPARIN SOD (PORK) LOCK FLUSH 100 UNIT/ML IV SOLN
500.0000 [IU] | Freq: Once | INTRAVENOUS | Status: AC | PRN
Start: 2023-06-16 — End: 2023-06-16
  Administered 2023-06-16: 500 [IU]

## 2023-06-16 MED ORDER — PEGFILGRASTIM-FPGK 6 MG/0.6ML ~~LOC~~ SOSY
6.0000 mg | PREFILLED_SYRINGE | Freq: Once | SUBCUTANEOUS | Status: AC
  Administered 2023-06-16: 6 mg via SUBCUTANEOUS
  Filled 2023-06-16: qty 0.6

## 2023-06-16 NOTE — Progress Notes (Signed)
Patients port flushed without difficulty. Good blood return noted with no bruising or swelling noted at site. Band aid applied. VSS with discharge and left in satisfactory condition with no s/s of distress noted. All follow ups as scheduled.  Samuel Hubbard [employer]
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
## 2023-06-29 ENCOUNTER — Encounter: Payer: Self-pay | Admitting: Hematology

## 2023-06-29 ENCOUNTER — Inpatient Hospital Stay: Admitting: Dietician

## 2023-06-29 ENCOUNTER — Inpatient Hospital Stay (HOSPITAL_BASED_OUTPATIENT_CLINIC_OR_DEPARTMENT_OTHER): Admitting: Hematology

## 2023-06-29 ENCOUNTER — Inpatient Hospital Stay: Attending: Hematology

## 2023-06-29 ENCOUNTER — Inpatient Hospital Stay

## 2023-06-29 VITALS — BP 132/74 | HR 74 | Resp 18

## 2023-06-29 VITALS — Wt 219.6 lb

## 2023-06-29 DIAGNOSIS — Z7984 Long term (current) use of oral hypoglycemic drugs: Secondary | ICD-10-CM | POA: Diagnosis not present

## 2023-06-29 DIAGNOSIS — I10 Essential (primary) hypertension: Secondary | ICD-10-CM | POA: Insufficient documentation

## 2023-06-29 DIAGNOSIS — Z5111 Encounter for antineoplastic chemotherapy: Secondary | ICD-10-CM | POA: Diagnosis present

## 2023-06-29 DIAGNOSIS — E119 Type 2 diabetes mellitus without complications: Secondary | ICD-10-CM | POA: Insufficient documentation

## 2023-06-29 DIAGNOSIS — C155 Malignant neoplasm of lower third of esophagus: Secondary | ICD-10-CM

## 2023-06-29 DIAGNOSIS — M25562 Pain in left knee: Secondary | ICD-10-CM | POA: Insufficient documentation

## 2023-06-29 DIAGNOSIS — Z79899 Other long term (current) drug therapy: Secondary | ICD-10-CM | POA: Insufficient documentation

## 2023-06-29 LAB — CBC WITH DIFFERENTIAL/PLATELET
Abs Immature Granulocytes: 0.19 10*3/uL — ABNORMAL HIGH (ref 0.00–0.07)
Basophils Absolute: 0.1 10*3/uL (ref 0.0–0.1)
Basophils Relative: 1 %
Eosinophils Absolute: 0 10*3/uL (ref 0.0–0.5)
Eosinophils Relative: 0 %
HCT: 33.4 % — ABNORMAL LOW (ref 39.0–52.0)
Hemoglobin: 10.5 g/dL — ABNORMAL LOW (ref 13.0–17.0)
Immature Granulocytes: 2 %
Lymphocytes Relative: 13 %
Lymphs Abs: 1.5 10*3/uL (ref 0.7–4.0)
MCH: 29.2 pg (ref 26.0–34.0)
MCHC: 31.4 g/dL (ref 30.0–36.0)
MCV: 92.8 fL (ref 80.0–100.0)
Monocytes Absolute: 0.6 10*3/uL (ref 0.1–1.0)
Monocytes Relative: 5 %
Neutro Abs: 9.3 10*3/uL — ABNORMAL HIGH (ref 1.7–7.7)
Neutrophils Relative %: 79 %
Platelets: 166 10*3/uL (ref 150–400)
RBC: 3.6 MIL/uL — ABNORMAL LOW (ref 4.22–5.81)
RDW: 14 % (ref 11.5–15.5)
WBC: 11.7 10*3/uL — ABNORMAL HIGH (ref 4.0–10.5)
nRBC: 0 % (ref 0.0–0.2)

## 2023-06-29 LAB — COMPREHENSIVE METABOLIC PANEL WITH GFR
ALT: 20 U/L (ref 0–44)
AST: 17 U/L (ref 15–41)
Albumin: 3.3 g/dL — ABNORMAL LOW (ref 3.5–5.0)
Alkaline Phosphatase: 93 U/L (ref 38–126)
Anion gap: 8 (ref 5–15)
BUN: 10 mg/dL (ref 8–23)
CO2: 27 mmol/L (ref 22–32)
Calcium: 8.9 mg/dL (ref 8.9–10.3)
Chloride: 101 mmol/L (ref 98–111)
Creatinine, Ser: 0.66 mg/dL (ref 0.61–1.24)
GFR, Estimated: >60 mL/min (ref >60)
Glucose, Bld: 249 mg/dL — ABNORMAL HIGH (ref 70–99)
Potassium: 3.6 mmol/L (ref 3.5–5.1)
Sodium: 136 mmol/L (ref 135–145)
Total Bilirubin: 0.2 mg/dL (ref 0.0–1.2)
Total Protein: 6.8 g/dL (ref 6.5–8.1)

## 2023-06-29 LAB — MAGNESIUM: Magnesium: 1.6 mg/dL — ABNORMAL LOW (ref 1.7–2.4)

## 2023-06-29 MED ORDER — SODIUM CHLORIDE 0.9 % IV SOLN
2600.0000 mg/m2 | INTRAVENOUS | Status: DC
  Administered 2023-06-29: 6050 mg via INTRAVENOUS
  Filled 2023-06-29: qty 121

## 2023-06-29 MED ORDER — SODIUM CHLORIDE 0.9% FLUSH
10.0000 mL | Freq: Once | INTRAVENOUS | Status: AC
  Administered 2023-06-29: 10 mL via INTRAVENOUS

## 2023-06-29 MED ORDER — MAGNESIUM SULFATE 2 GM/50ML IV SOLN
2.0000 g | Freq: Once | INTRAVENOUS | Status: AC
  Administered 2023-06-29: 2 g via INTRAVENOUS
  Filled 2023-06-29: qty 50

## 2023-06-29 MED ORDER — LEUCOVORIN CALCIUM INJECTION 350 MG
200.0000 mg/m2 | Freq: Once | INTRAVENOUS | Status: AC
  Administered 2023-06-29: 464 mg via INTRAVENOUS
  Filled 2023-06-29: qty 23.2

## 2023-06-29 MED ORDER — SODIUM CHLORIDE 0.9 % IV SOLN
150.0000 mg | Freq: Once | INTRAVENOUS | Status: AC
  Administered 2023-06-29: 150 mg via INTRAVENOUS
  Filled 2023-06-29: qty 150

## 2023-06-29 MED ORDER — DEXTROSE 5 % IV SOLN: INTRAVENOUS | Status: DC

## 2023-06-29 MED ORDER — SODIUM CHLORIDE 0.9 % IV SOLN
50.0000 mg/m2 | Freq: Once | INTRAVENOUS | Status: AC
  Administered 2023-06-29: 116 mg via INTRAVENOUS
  Filled 2023-06-29: qty 11.6

## 2023-06-29 MED ORDER — PALONOSETRON HCL INJECTION 0.25 MG/5ML
0.2500 mg | Freq: Once | INTRAVENOUS | Status: AC
  Administered 2023-06-29: 0.25 mg via INTRAVENOUS
  Filled 2023-06-29: qty 5

## 2023-06-29 MED ORDER — DEXAMETHASONE SODIUM PHOSPHATE 10 MG/ML IJ SOLN
10.0000 mg | Freq: Once | INTRAMUSCULAR | Status: AC
  Administered 2023-06-29: 10 mg via INTRAVENOUS
  Filled 2023-06-29: qty 1

## 2023-06-29 MED ORDER — OXALIPLATIN CHEMO INJECTION 100 MG/20ML
85.0000 mg/m2 | Freq: Once | INTRAVENOUS | Status: AC
  Administered 2023-06-29: 200 mg via INTRAVENOUS
  Filled 2023-06-29: qty 40

## 2023-06-29 MED ORDER — INSULIN ASPART 100 UNIT/ML IJ SOLN
10.0000 [IU] | Freq: Once | INTRAMUSCULAR | Status: AC
  Administered 2023-06-29: 10 [IU] via SUBCUTANEOUS
  Filled 2023-06-29: qty 1

## 2023-06-29 NOTE — Patient Instructions (Signed)
 [facility] - A DEPT OF [facility]  Discharge Instructions: Thank you for choosing [facility] to provide your oncology and hematology care.  If you have a lab appointment with the Cancer Center - please note that after April 8th, 2024, all labs will be drawn in the cancer center.  You do not have to check in or register with the main entrance as you have in the past but will complete your check-in in the cancer center.  Wear comfortable clothing and clothing appropriate for easy access to any Portacath or PICC line.   We strive to give you quality time with your provider. You may need to reschedule your appointment if you arrive late (15 or more minutes).  Arriving late affects you and other patients whose appointments are after yours.  Also, if you miss three or more appointments without notifying the office, you may be dismissed from the clinic at the provider's discretion.      For prescription refill requests, have your pharmacy contact our office and allow 72 hours for refills to be completed.    Today you received the following chemotherapy and/or immunotherapy agents Taxotere , oxalplatin, adrucil     To help prevent nausea and vomiting after your treatment, we encourage you to take your nausea medication as directed.  BELOW ARE SYMPTOMS THAT SHOULD BE REPORTED IMMEDIATELY: *FEVER GREATER THAN 100.4 F (38 C) OR HIGHER *CHILLS OR SWEATING *NAUSEA AND VOMITING THAT IS NOT CONTROLLED WITH YOUR NAUSEA MEDICATION *UNUSUAL SHORTNESS OF BREATH *UNUSUAL BRUISING OR BLEEDING *URINARY PROBLEMS (pain or burning when urinating, or frequent urination) *BOWEL PROBLEMS (unusual diarrhea, constipation, pain near the anus) TENDERNESS IN MOUTH AND THROAT WITH OR WITHOUT PRESENCE OF ULCERS (sore throat, sores in mouth, or a toothache) UNUSUAL RASH, SWELLING OR PAIN  UNUSUAL VAGINAL DISCHARGE OR ITCHING   Items with * indicate a potential emergency and  should be followed up as soon as possible or go to the Emergency Department if any problems should occur.  Please show the CHEMOTHERAPY ALERT CARD or IMMUNOTHERAPY ALERT CARD at check-in to the Emergency Department and triage nurse.  Should you have questions after your visit or need to cancel or reschedule your appointment, please contact [facility] [place] - A DEPT OF [facility] [phone]  and follow the prompts.  Office hours are 8:00 a.m. to 4:30 p.m. Monday - Friday. Please note that voicemails left after 4:00 p.m. may not be returned until the following business day.  We are closed weekends and major holidays. You have access to a nurse at all times for urgent questions. Please call the main number to the clinic [phone] and follow the prompts.  For any non-urgent questions, you may also contact your provider using MyChart. We now offer e-Visits for anyone 12 and older to request care online for non-urgent symptoms. For details visit [email or web address].   Also download the MyChart app! Go to the app store, search "MyChart", open the app, select [facility], and log in with your MyChart username and password.

## 2023-06-29 NOTE — Progress Notes (Signed)
 Nutrition Follow-up:  Patient with malignant neoplasm of lower third esophagus. He is receiving neoadjuvant FLOT q14d. Patient has completed 6 of 8 planned cycles.   Met with patient and wife in infusion. He reports doing well overall. His appetite has improved and has gained weight since last treatment. Patient reports eating one good meal and snacks. He is drinking 2 Ensure Max. Cold sensitivity lasting 5-7 days after treatment. Supplements are hard to drink during this time. Pt denies nausea, vomiting, diarrhea, constipation. He is staying active. Reports climbing on the roof to clean gutters yesterday.   Medications: reviewed   Labs: glucose 249, albumin 3.3  Anthropometrics: Wt 219 lb 9.3 oz - increased   4/28 - 215 lb 9.8 oz 4/14 - 221 lb 9.8 oz  3/30 - 230 lb 2.6 oz    NUTRITION DIAGNOSIS: Food and nutrition related knowledge deficit improving    INTERVENTION:  Encourage high calorie high protein snacks  Continue drinking 2 Ensure Max Continue activity as able     MONITORING, EVALUATION, GOAL: wt trends, intake   NEXT VISIT: Monday June 9 during infusion

## 2023-06-29 NOTE — Progress Notes (Signed)
 Patient has been examined by Dr. Ellin Saba. Vital signs and labs have been reviewed by MD - ANC, Creatinine, LFTs, hemoglobin, and platelets are within treatment parameters per M.D. - pt may proceed with treatment.  Primary RN and pharmacy notified.

## 2023-06-29 NOTE — Progress Notes (Signed)
 Patient tolerated chemotherapy with no complaints voiced.  Side effects with management reviewed with understanding verbalized.  Port site clean and dry with no bruising or swelling noted at site.  Good blood return noted before and after administration of chemotherapy.  5FU pump start for home use pt due to return to clinic 06/30/23 for pump stop. When RN was starting 5FU pump, pt expressed that his groin area and buttocks area was itching and warm but no rash was present. RN made MD aware, no new orders given. RN advise pt to take benadryl at home if the itching does not subside and to notify the clinic if any complications occur. Pt verbalized understanding.  Patient left in satisfactory condition with VSS and no s/s of distress noted.   Samuel Hubbard [employer]

## 2023-06-29 NOTE — Patient Instructions (Signed)

## 2023-06-30 ENCOUNTER — Inpatient Hospital Stay

## 2023-07-01 ENCOUNTER — Inpatient Hospital Stay

## 2023-07-01 VITALS — BP 68/51 | HR 85 | Temp 96.2°F | Resp 18

## 2023-07-01 VITALS — BP 104/67 | HR 73 | Resp 17

## 2023-07-01 DIAGNOSIS — Z5111 Encounter for antineoplastic chemotherapy: Secondary | ICD-10-CM | POA: Diagnosis not present

## 2023-07-01 DIAGNOSIS — C155 Malignant neoplasm of lower third of esophagus: Secondary | ICD-10-CM

## 2023-07-01 DIAGNOSIS — I959 Hypotension, unspecified: Secondary | ICD-10-CM

## 2023-07-01 DIAGNOSIS — R531 Weakness: Secondary | ICD-10-CM

## 2023-07-01 MED ORDER — SODIUM CHLORIDE 0.9 % IV SOLN: Freq: Once | INTRAVENOUS | Status: AC

## 2023-07-01 MED ORDER — SODIUM CHLORIDE 0.9% FLUSH
10.0000 mL | INTRAVENOUS | Status: DC | PRN
  Administered 2023-07-01: 10 mL

## 2023-07-01 MED ORDER — HEPARIN SOD (PORK) LOCK FLUSH 100 UNIT/ML IV SOLN
500.0000 [IU] | Freq: Once | INTRAVENOUS | Status: AC | PRN
  Administered 2023-07-01: 500 [IU]

## 2023-07-01 MED ORDER — PEGFILGRASTIM-FPGK 6 MG/0.6ML ~~LOC~~ SOSY
6.0000 mg | PREFILLED_SYRINGE | Freq: Once | SUBCUTANEOUS | Status: AC
  Administered 2023-07-01: 6 mg via SUBCUTANEOUS
  Filled 2023-07-01: qty 0.6

## 2023-07-01 NOTE — Progress Notes (Signed)
 Patient added on for IVF due to hypotension at his pump d/c appointment.   Patient will receive 1 L of NS over 2 hours per Dr. Cheree Cords.    Patient tolerated IVF well with no complaints voiced.  Patient left via wheelchair with wife in stable condition.  Vital signs stable at discharge.  Follow up as scheduled.

## 2023-07-01 NOTE — Patient Instructions (Signed)

## 2023-07-01 NOTE — Progress Notes (Signed)
 Patient stated he is dizzy and light headed.  See flowsheets for Vital signs.  Provider made aware.    Patient tolerated injection with no complaints voiced.  Site clean and dry with no bruising or swelling noted at site.  See MAR for details.  Band aid applied.  Patient stable during and after injection.  Vss with discharge and left in satisfactory condition with no s/s of distress noted.   Patient for chemotherapy pump disconnect with no complaints voiced.  Patients port flushed without difficulty.  Good blood return noted with no bruising or swelling noted at site.  Patient left accessed for fluids.

## 2023-07-04 ENCOUNTER — Other Ambulatory Visit: Payer: Self-pay | Admitting: Internal Medicine

## 2023-07-14 ENCOUNTER — Inpatient Hospital Stay (HOSPITAL_BASED_OUTPATIENT_CLINIC_OR_DEPARTMENT_OTHER): Admitting: Hematology

## 2023-07-14 ENCOUNTER — Inpatient Hospital Stay

## 2023-07-14 VITALS — BP 124/87

## 2023-07-14 VITALS — BP 142/86 | HR 75 | Temp 97.0°F | Resp 18

## 2023-07-14 DIAGNOSIS — C155 Malignant neoplasm of lower third of esophagus: Secondary | ICD-10-CM

## 2023-07-14 DIAGNOSIS — Z5111 Encounter for antineoplastic chemotherapy: Secondary | ICD-10-CM | POA: Diagnosis not present

## 2023-07-14 LAB — CBC WITH DIFFERENTIAL/PLATELET
Abs Immature Granulocytes: 0.65 10*3/uL — ABNORMAL HIGH (ref 0.00–0.07)
Basophils Absolute: 0.1 10*3/uL (ref 0.0–0.1)
Basophils Relative: 1 %
Eosinophils Absolute: 0.1 10*3/uL (ref 0.0–0.5)
Eosinophils Relative: 1 %
HCT: 33.3 % — ABNORMAL LOW (ref 39.0–52.0)
Hemoglobin: 10.2 g/dL — ABNORMAL LOW (ref 13.0–17.0)
Immature Granulocytes: 5 %
Lymphocytes Relative: 16 %
Lymphs Abs: 2.1 10*3/uL (ref 0.7–4.0)
MCH: 28.8 pg (ref 26.0–34.0)
MCHC: 30.6 g/dL (ref 30.0–36.0)
MCV: 94.1 fL (ref 80.0–100.0)
Monocytes Absolute: 0.7 10*3/uL (ref 0.1–1.0)
Monocytes Relative: 5 %
Neutro Abs: 9.8 10*3/uL — ABNORMAL HIGH (ref 1.7–7.7)
Neutrophils Relative %: 72 %
Platelets: 197 10*3/uL (ref 150–400)
RBC: 3.54 MIL/uL — ABNORMAL LOW (ref 4.22–5.81)
RDW: 15.4 % (ref 11.5–15.5)
WBC: 13.4 10*3/uL — ABNORMAL HIGH (ref 4.0–10.5)
nRBC: 0 % (ref 0.0–0.2)

## 2023-07-14 LAB — COMPREHENSIVE METABOLIC PANEL WITH GFR
ALT: 32 U/L (ref 0–44)
AST: 17 U/L (ref 15–41)
Albumin: 3.2 g/dL — ABNORMAL LOW (ref 3.5–5.0)
Alkaline Phosphatase: 103 U/L (ref 38–126)
Anion gap: 9 (ref 5–15)
BUN: 7 mg/dL — ABNORMAL LOW (ref 8–23)
CO2: 26 mmol/L (ref 22–32)
Calcium: 8.8 mg/dL — ABNORMAL LOW (ref 8.9–10.3)
Chloride: 103 mmol/L (ref 98–111)
Creatinine, Ser: 0.68 mg/dL (ref 0.61–1.24)
GFR, Estimated: >60 mL/min (ref >60)
Glucose, Bld: 157 mg/dL — ABNORMAL HIGH (ref 70–99)
Potassium: 3.6 mmol/L (ref 3.5–5.1)
Sodium: 138 mmol/L (ref 135–145)
Total Bilirubin: 0.4 mg/dL (ref 0.0–1.2)
Total Protein: 6.6 g/dL (ref 6.5–8.1)

## 2023-07-14 LAB — MAGNESIUM: Magnesium: 1.7 mg/dL (ref 1.7–2.4)

## 2023-07-14 MED ORDER — OXALIPLATIN CHEMO INJECTION 100 MG/20ML
85.0000 mg/m2 | Freq: Once | INTRAVENOUS | Status: AC
  Administered 2023-07-14: 200 mg via INTRAVENOUS
  Filled 2023-07-14: qty 40

## 2023-07-14 MED ORDER — SODIUM CHLORIDE 0.9 % IV SOLN
2600.0000 mg/m2 | INTRAVENOUS | Status: DC
  Administered 2023-07-14: 6050 mg via INTRAVENOUS
  Filled 2023-07-14: qty 121

## 2023-07-14 MED ORDER — LEUCOVORIN CALCIUM INJECTION 350 MG
200.0000 mg/m2 | Freq: Once | INTRAMUSCULAR | Status: AC
  Administered 2023-07-14: 464 mg via INTRAVENOUS
  Filled 2023-07-14: qty 23.2

## 2023-07-14 MED ORDER — SODIUM CHLORIDE 0.9% FLUSH
10.0000 mL | INTRAVENOUS | Status: DC | PRN
  Administered 2023-07-14: 10 mL

## 2023-07-14 MED ORDER — HEPARIN SOD (PORK) LOCK FLUSH 100 UNIT/ML IV SOLN: 500.0000 [IU] | Freq: Once | INTRAVENOUS | Status: DC | PRN

## 2023-07-14 MED ORDER — DEXTROSE 5 % IV SOLN: INTRAVENOUS | Status: DC

## 2023-07-14 MED ORDER — FOSAPREPITANT DIMEGLUMINE INJECTION 150 MG
150.0000 mg | Freq: Once | INTRAVENOUS | Status: AC
  Administered 2023-07-14: 150 mg via INTRAVENOUS
  Filled 2023-07-14: qty 150

## 2023-07-14 MED ORDER — SODIUM CHLORIDE 0.9 % IV SOLN
50.0000 mg/m2 | Freq: Once | INTRAVENOUS | Status: AC
  Administered 2023-07-14: 116 mg via INTRAVENOUS
  Filled 2023-07-14: qty 11.6

## 2023-07-14 MED ORDER — DEXAMETHASONE SODIUM PHOSPHATE 10 MG/ML IJ SOLN
10.0000 mg | Freq: Once | INTRAMUSCULAR | Status: AC
  Administered 2023-07-14: 10 mg via INTRAVENOUS
  Filled 2023-07-14: qty 1

## 2023-07-14 MED ORDER — PALONOSETRON HCL INJECTION 0.25 MG/5ML
0.2500 mg | Freq: Once | INTRAVENOUS | Status: AC
  Administered 2023-07-14: 0.25 mg via INTRAVENOUS
  Filled 2023-07-14: qty 5

## 2023-07-14 MED ORDER — SODIUM CHLORIDE 0.9% FLUSH
10.0000 mL | Freq: Once | INTRAVENOUS | Status: AC
  Administered 2023-07-14: 10 mL via INTRAVENOUS
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
## 2023-07-14 NOTE — Patient Instructions (Signed)
 [facility] [place] - A DEPT OF [facility]  Discharge Instructions: Thank you for choosing [facility] to provide your oncology and hematology care.  If you have a lab appointment with the Cancer Center - please note that after April 8th, 2024, all labs will be drawn in the cancer center.  You do not have to check in or register with the main entrance as you have in the past but will complete your check-in in the cancer center.  Wear comfortable clothing and clothing appropriate for easy access to any Portacath or PICC line.   We strive to give you quality time with your provider. You may need to reschedule your appointment if you arrive late (15 or more minutes).  Arriving late affects you and other patients whose appointments are after yours.  Also, if you miss three or more appointments without notifying the office, you may be dismissed from the clinic at the provider's discretion.      For prescription refill requests, have your pharmacy contact our office and allow 72 hours for refills to be completed.    Today you received the following chemotherapy and/or immunotherapy agents FLOT, return as scheduled.    To help prevent nausea and vomiting after your treatment, we encourage you to take your nausea medication as directed.  BELOW ARE SYMPTOMS THAT SHOULD BE REPORTED IMMEDIATELY: *FEVER GREATER THAN 100.4 F (38 C) OR HIGHER *CHILLS OR SWEATING *NAUSEA AND VOMITING THAT IS NOT CONTROLLED WITH YOUR NAUSEA MEDICATION *UNUSUAL SHORTNESS OF BREATH *UNUSUAL BRUISING OR BLEEDING *URINARY PROBLEMS (pain or burning when urinating, or frequent urination) *BOWEL PROBLEMS (unusual diarrhea, constipation, pain near the anus) TENDERNESS IN MOUTH AND THROAT WITH OR WITHOUT PRESENCE OF ULCERS (sore throat, sores in mouth, or a toothache) UNUSUAL RASH, SWELLING OR PAIN  UNUSUAL VAGINAL DISCHARGE OR ITCHING   Items with * indicate a potential emergency and should  be followed up as soon as possible or go to the Emergency Department if any problems should occur.  Please show the CHEMOTHERAPY ALERT CARD or IMMUNOTHERAPY ALERT CARD at check-in to the Emergency Department and triage nurse.  Should you have questions after your visit or need to cancel or reschedule your appointment, please contact [facility] [place] - A DEPT OF [facility] [phone]  and follow the prompts.  Office hours are 8:00 a.m. to 4:30 p.m. Monday - Friday. Please note that voicemails left after 4:00 p.m. may not be returned until the following business day.  We are closed weekends and major holidays. You have access to a nurse at all times for urgent questions. Please call the main number to the clinic [phone] and follow the prompts.  For any non-urgent questions, you may also contact your provider using MyChart. We now offer e-Visits for anyone 41 and older to request care online for non-urgent symptoms. For details visit [email or web address].   Also download the MyChart app! Go to the app store, search "MyChart", open the app, select [place], and log in with your MyChart username and password.

## 2023-07-14 NOTE — Progress Notes (Signed)
 Patient has been examined by Dr. Ellin Saba. Vital signs and labs have been reviewed by MD - ANC, Creatinine, LFTs, hemoglobin, and platelets are within treatment parameters per M.D. - pt may proceed with treatment.  Primary RN and pharmacy notified.

## 2023-07-14 NOTE — Patient Instructions (Signed)

## 2023-07-14 NOTE — Progress Notes (Signed)
Patient tolerated chemotherapy with no complaints voiced. Side effects with management reviewed understanding verbalized. Port site clean and dry with no bruising or swelling noted at site. Good blood return noted before and after administration of chemotherapy. Chemo pump connected with no alarms noted. Patient left in satisfactory condition with VSS and no s/s of distress noted.  °

## 2023-07-15 ENCOUNTER — Inpatient Hospital Stay

## 2023-07-15 VITALS — BP 106/63 | HR 89 | Temp 98.6°F | Resp 20

## 2023-07-15 DIAGNOSIS — Z5111 Encounter for antineoplastic chemotherapy: Secondary | ICD-10-CM | POA: Diagnosis not present

## 2023-07-15 DIAGNOSIS — C155 Malignant neoplasm of lower third of esophagus: Secondary | ICD-10-CM

## 2023-07-15 MED ORDER — SODIUM CHLORIDE 0.9% FLUSH
10.0000 mL | INTRAVENOUS | Status: DC | PRN
  Administered 2023-07-15: 10 mL

## 2023-07-15 MED ORDER — HEPARIN SOD (PORK) LOCK FLUSH 100 UNIT/ML IV SOLN
500.0000 [IU] | Freq: Once | INTRAVENOUS | Status: AC | PRN
  Administered 2023-07-15: 500 [IU]

## 2023-07-15 MED ORDER — PEGFILGRASTIM-FPGK 6 MG/0.6ML ~~LOC~~ SOSY
6.0000 mg | PREFILLED_SYRINGE | Freq: Once | SUBCUTANEOUS | Status: AC
  Administered 2023-07-15: 6 mg via SUBCUTANEOUS
  Filled 2023-07-15: qty 0.6

## 2023-07-15 NOTE — Progress Notes (Signed)
 Patient presents today for 5FU chemotherapy pump disconnection and Stimufend  6 mg per provider's order. Vital signs stable and patient voiced no new complaints at this time.   Port flushed easily with 10 mL of normal saline and 5 mL of heparin . Good blood return noted and needle removed intact. No bruising or swelling noted at the site.  Discharged from clinic ambulatory in stable condition. Alert and oriented x 3. F/U with [facility] as scheduled.

## 2023-07-15 NOTE — Patient Instructions (Signed)
 [facility] - A DEPT OF [facility]  Discharge Instructions: Thank you for choosing [facility] to provide your oncology and hematology care.  If you have a lab appointment with the Cancer Center - please note that after April 8th, 2024, all labs will be drawn in the cancer center.  You do not have to check in or register with the main entrance as you have in the past but will complete your check-in in the cancer center.  Wear comfortable clothing and clothing appropriate for easy access to any Portacath or PICC line.   We strive to give you quality time with your provider. You may need to reschedule your appointment if you arrive late (15 or more minutes).  Arriving late affects you and other patients whose appointments are after yours.  Also, if you miss three or more appointments without notifying the office, you may be dismissed from the clinic at the provider's discretion.      For prescription refill requests, have your pharmacy contact our office and allow 72 hours for refills to be completed.    Today you received 5FU chemotherapy disconnection pump.     BELOW ARE SYMPTOMS THAT SHOULD BE REPORTED IMMEDIATELY: *FEVER GREATER THAN 100.4 F (38 C) OR HIGHER *CHILLS OR SWEATING *NAUSEA AND VOMITING THAT IS NOT CONTROLLED WITH YOUR NAUSEA MEDICATION *UNUSUAL SHORTNESS OF BREATH *UNUSUAL BRUISING OR BLEEDING *URINARY PROBLEMS (pain or burning when urinating, or frequent urination) *BOWEL PROBLEMS (unusual diarrhea, constipation, pain near the anus) TENDERNESS IN MOUTH AND THROAT WITH OR WITHOUT PRESENCE OF ULCERS (sore throat, sores in mouth, or a toothache) UNUSUAL RASH, SWELLING OR PAIN  UNUSUAL VAGINAL DISCHARGE OR ITCHING   Items with * indicate a potential emergency and should be followed up as soon as possible or go to the Emergency Department if any problems should occur.  Please show the CHEMOTHERAPY ALERT CARD or IMMUNOTHERAPY  ALERT CARD at check-in to the Emergency Department and triage nurse.  Should you have questions after your visit or need to cancel or reschedule your appointment, please contact [facility] [place] - A DEPT OF [facility] [phone]  and follow the prompts.  Office hours are 8:00 a.m. to 4:30 p.m. Monday - Friday. Please note that voicemails left after 4:00 p.m. may not be returned until the following business day.  We are closed weekends and major holidays. You have access to a nurse at all times for urgent questions. Please call the main number to the clinic [phone] and follow the prompts.  For any non-urgent questions, you may also contact your provider using MyChart. We now offer e-Visits for anyone 100 and older to request care online for non-urgent symptoms. For details visit [email or web address].   Also download the MyChart app! Go to the app store, search "MyChart", open the app, select [place], and log in with your MyChart username and password.

## 2023-07-16 ENCOUNTER — Inpatient Hospital Stay

## 2023-07-27 ENCOUNTER — Inpatient Hospital Stay

## 2023-07-27 ENCOUNTER — Inpatient Hospital Stay: Admitting: Dietician

## 2023-07-27 ENCOUNTER — Inpatient Hospital Stay: Attending: Hematology

## 2023-07-27 ENCOUNTER — Inpatient Hospital Stay (HOSPITAL_BASED_OUTPATIENT_CLINIC_OR_DEPARTMENT_OTHER): Admitting: Hematology

## 2023-07-27 VITALS — BP 141/84 | HR 71 | Resp 17

## 2023-07-27 DIAGNOSIS — Z79899 Other long term (current) drug therapy: Secondary | ICD-10-CM | POA: Insufficient documentation

## 2023-07-27 DIAGNOSIS — C155 Malignant neoplasm of lower third of esophagus: Secondary | ICD-10-CM | POA: Insufficient documentation

## 2023-07-27 DIAGNOSIS — Z7984 Long term (current) use of oral hypoglycemic drugs: Secondary | ICD-10-CM | POA: Insufficient documentation

## 2023-07-27 DIAGNOSIS — E119 Type 2 diabetes mellitus without complications: Secondary | ICD-10-CM | POA: Insufficient documentation

## 2023-07-27 DIAGNOSIS — Z95828 Presence of other vascular implants and grafts: Secondary | ICD-10-CM

## 2023-07-27 DIAGNOSIS — I10 Essential (primary) hypertension: Secondary | ICD-10-CM | POA: Insufficient documentation

## 2023-07-27 DIAGNOSIS — Z5111 Encounter for antineoplastic chemotherapy: Secondary | ICD-10-CM | POA: Diagnosis present

## 2023-07-27 LAB — COMPREHENSIVE METABOLIC PANEL WITH GFR
ALT: 29 U/L (ref 0–44)
AST: 19 U/L (ref 15–41)
Albumin: 3.2 g/dL — ABNORMAL LOW (ref 3.5–5.0)
Alkaline Phosphatase: 123 U/L (ref 38–126)
Anion gap: 10 (ref 5–15)
BUN: 15 mg/dL (ref 8–23)
CO2: 25 mmol/L (ref 22–32)
Calcium: 8.8 mg/dL — ABNORMAL LOW (ref 8.9–10.3)
Chloride: 102 mmol/L (ref 98–111)
Creatinine, Ser: 0.65 mg/dL (ref 0.61–1.24)
GFR, Estimated: >60 mL/min (ref >60)
Glucose, Bld: 188 mg/dL — ABNORMAL HIGH (ref 70–99)
Potassium: 4 mmol/L (ref 3.5–5.1)
Sodium: 137 mmol/L (ref 135–145)
Total Bilirubin: 0.5 mg/dL (ref 0.0–1.2)
Total Protein: 6.4 g/dL — ABNORMAL LOW (ref 6.5–8.1)

## 2023-07-27 LAB — CBC WITH DIFFERENTIAL/PLATELET
Abs Immature Granulocytes: 0.52 10*3/uL — ABNORMAL HIGH (ref 0.00–0.07)
Basophils Absolute: 0.1 10*3/uL (ref 0.0–0.1)
Basophils Relative: 1 %
Eosinophils Absolute: 0.1 10*3/uL (ref 0.0–0.5)
Eosinophils Relative: 1 %
HCT: 34 % — ABNORMAL LOW (ref 39.0–52.0)
Hemoglobin: 10.5 g/dL — ABNORMAL LOW (ref 13.0–17.0)
Immature Granulocytes: 4 %
Lymphocytes Relative: 16 %
Lymphs Abs: 2 10*3/uL (ref 0.7–4.0)
MCH: 29.7 pg (ref 26.0–34.0)
MCHC: 30.9 g/dL (ref 30.0–36.0)
MCV: 96 fL (ref 80.0–100.0)
Monocytes Absolute: 1 10*3/uL (ref 0.1–1.0)
Monocytes Relative: 8 %
Neutro Abs: 9 10*3/uL — ABNORMAL HIGH (ref 1.7–7.7)
Neutrophils Relative %: 70 %
Platelets: 195 10*3/uL (ref 150–400)
RBC: 3.54 MIL/uL — ABNORMAL LOW (ref 4.22–5.81)
RDW: 16.6 % — ABNORMAL HIGH (ref 11.5–15.5)
WBC: 12.8 10*3/uL — ABNORMAL HIGH (ref 4.0–10.5)
nRBC: 0.2 % (ref 0.0–0.2)

## 2023-07-27 LAB — MAGNESIUM: Magnesium: 1.8 mg/dL (ref 1.7–2.4)

## 2023-07-27 MED ORDER — SODIUM CHLORIDE 0.9% FLUSH
10.0000 mL | Freq: Once | INTRAVENOUS | Status: AC
  Administered 2023-07-27: 10 mL via INTRAVENOUS

## 2023-07-27 MED ORDER — PALONOSETRON HCL INJECTION 0.25 MG/5ML
0.2500 mg | Freq: Once | INTRAVENOUS | Status: AC
  Administered 2023-07-27: 0.25 mg via INTRAVENOUS
  Filled 2023-07-27: qty 5

## 2023-07-27 MED ORDER — DEXAMETHASONE SODIUM PHOSPHATE 10 MG/ML IJ SOLN
10.0000 mg | Freq: Once | INTRAMUSCULAR | Status: AC
  Administered 2023-07-27: 10 mg via INTRAVENOUS
  Filled 2023-07-27: qty 1

## 2023-07-27 MED ORDER — LEUCOVORIN CALCIUM INJECTION 350 MG
200.0000 mg/m2 | Freq: Once | INTRAVENOUS | Status: AC
  Administered 2023-07-27: 464 mg via INTRAVENOUS
  Filled 2023-07-27: qty 23.2

## 2023-07-27 MED ORDER — SODIUM CHLORIDE 0.9 % IV SOLN
150.0000 mg | Freq: Once | INTRAVENOUS | Status: AC
  Administered 2023-07-27: 150 mg via INTRAVENOUS
  Filled 2023-07-27: qty 150

## 2023-07-27 MED ORDER — DEXTROSE 5 % IV SOLN: INTRAVENOUS | Status: DC

## 2023-07-27 MED ORDER — OXALIPLATIN CHEMO INJECTION 100 MG/20ML
85.0000 mg/m2 | Freq: Once | INTRAVENOUS | Status: AC
  Administered 2023-07-27: 200 mg via INTRAVENOUS
  Filled 2023-07-27: qty 40

## 2023-07-27 MED ORDER — SODIUM CHLORIDE 0.9 % IV SOLN
50.0000 mg/m2 | Freq: Once | INTRAVENOUS | Status: AC
  Administered 2023-07-27: 116 mg via INTRAVENOUS
  Filled 2023-07-27: qty 11.6

## 2023-07-27 MED ORDER — SODIUM CHLORIDE 0.9 % IV SOLN: Freq: Once | INTRAVENOUS | Status: AC

## 2023-07-27 MED ORDER — SODIUM CHLORIDE 0.9 % IV SOLN
2600.0000 mg/m2 | INTRAVENOUS | Status: DC
  Administered 2023-07-27: 6050 mg via INTRAVENOUS
  Filled 2023-07-27: qty 121

## 2023-07-27 NOTE — Progress Notes (Signed)
 Patient has been examined by Dr. Ellin Saba. Vital signs and labs have been reviewed by MD - ANC, Creatinine, LFTs, hemoglobin, and platelets are within treatment parameters per M.D. - pt may proceed with treatment.  Primary RN and pharmacy notified.

## 2023-07-27 NOTE — Progress Notes (Signed)
 Nutrition Follow-up:  Patient with malignant neoplasm of lower third esophagus. He is receiving neoadjuvant FLOT q14d. Planning for esophagectomy at [facility].   Met with patient and wife in infusion. Patient in good spirits. Today is last chemotherapy - he has completed 8 cycles of FLOT. Patient reports cold sensitivity is lasting longer. Says he was able to finally have cold foods 2 days ago. This makes eating and drinking challenging, however he is pushing. Patient is drinking 2 Ensure Max. MD has recommended increasing to 3 but pt has poor tolerance to room temperature Ensure. He denies nausea, vomiting, diarrhea. Patient has intermittent constipation. He takes stool softener as needed.    Medications: reviewed   Labs: glucose 188, albumin 3.2  Anthropometrics: Wt 217 lb 8 oz today slightly decreased   5/27 - 219 lb 8 oz 5/12 - 219 lb 9.3 oz  4/28 - 215 lb 9.8 oz    NUTRITION DIAGNOSIS: Food and nutrition related knowledge deficit improved    INTERVENTION:  Encourage high calorie high protein foods Continue Ensure Max - increase to 3/day as tolerated  Monitor for discharge s/p planned surgery Support and encouragement     MONITORING, EVALUATION, GOAL: wt trends, intake   NEXT VISIT: Monday August 4 in clinic
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
## 2023-07-27 NOTE — Patient Instructions (Signed)

## 2023-07-27 NOTE — Progress Notes (Signed)
 Patient tolerated chemotherapy with no complaints voiced.  Side effects with management reviewed with understanding verbalized.  Port site clean and dry with no bruising or swelling noted at site.  Good blood return noted before and after administration of chemotherapy. 5FU pump started for home use. Pt due to return to clinic 07/28/23 for pump stop. Patient left in satisfactory condition with VSS and no s/s of distress noted. All follow ups as scheduled.   Hadi Dubin [employer]

## 2023-07-27 NOTE — Patient Instructions (Signed)
 [facility] - A DEPT OF [facility]  Discharge Instructions: Thank you for choosing [facility] to provide your oncology and hematology care.  If you have a lab appointment with the Cancer Center - please note that after April 8th, 2024, all labs will be drawn in the cancer center.  You do not have to check in or register with the main entrance as you have in the past but will complete your check-in in the cancer center.  Wear comfortable clothing and clothing appropriate for easy access to any Portacath or PICC line.   We strive to give you quality time with your provider. You may need to reschedule your appointment if you arrive late (15 or more minutes).  Arriving late affects you and other patients whose appointments are after yours.  Also, if you miss three or more appointments without notifying the office, you may be dismissed from the clinic at the provider's discretion.      For prescription refill requests, have your pharmacy contact our office and allow 72 hours for refills to be completed.    Today you received the following chemotherapy and/or immunotherapy agents taxotere , oxailplatin, leucovorin , adrucil     To help prevent nausea and vomiting after your treatment, we encourage you to take your nausea medication as directed.  BELOW ARE SYMPTOMS THAT SHOULD BE REPORTED IMMEDIATELY: *FEVER GREATER THAN 100.4 F (38 C) OR HIGHER *CHILLS OR SWEATING *NAUSEA AND VOMITING THAT IS NOT CONTROLLED WITH YOUR NAUSEA MEDICATION *UNUSUAL SHORTNESS OF BREATH *UNUSUAL BRUISING OR BLEEDING *URINARY PROBLEMS (pain or burning when urinating, or frequent urination) *BOWEL PROBLEMS (unusual diarrhea, constipation, pain near the anus) TENDERNESS IN MOUTH AND THROAT WITH OR WITHOUT PRESENCE OF ULCERS (sore throat, sores in mouth, or a toothache) UNUSUAL RASH, SWELLING OR PAIN  UNUSUAL VAGINAL DISCHARGE OR ITCHING   Items with * indicate a potential  emergency and should be followed up as soon as possible or go to the Emergency Department if any problems should occur.  Please show the CHEMOTHERAPY ALERT CARD or IMMUNOTHERAPY ALERT CARD at check-in to the Emergency Department and triage nurse.  Should you have questions after your visit or need to cancel or reschedule your appointment, please contact [facility] [place] - A DEPT OF [facility] [phone]  and follow the prompts.  Office hours are 8:00 a.m. to 4:30 p.m. Monday - Friday. Please note that voicemails left after 4:00 p.m. may not be returned until the following business day.  We are closed weekends and major holidays. You have access to a nurse at all times for urgent questions. Please call the main number to the clinic [phone] and follow the prompts.  For any non-urgent questions, you may also contact your provider using MyChart. We now offer e-Visits for anyone 83 and older to request care online for non-urgent symptoms. For details visit [email or web address].   Also download the MyChart app! Go to the app store, search "MyChart", open the app, select [place], and log in with your MyChart username and password.

## 2023-07-28 ENCOUNTER — Inpatient Hospital Stay

## 2023-07-28 VITALS — BP 119/78 | HR 78 | Resp 19

## 2023-07-28 DIAGNOSIS — C155 Malignant neoplasm of lower third of esophagus: Secondary | ICD-10-CM | POA: Diagnosis not present

## 2023-07-28 MED ORDER — SODIUM CHLORIDE 0.9% FLUSH
10.0000 mL | INTRAVENOUS | Status: DC | PRN
  Administered 2023-07-28: 10 mL

## 2023-07-28 MED ORDER — PEGFILGRASTIM-FPGK 6 MG/0.6ML ~~LOC~~ SOSY
6.0000 mg | PREFILLED_SYRINGE | Freq: Once | SUBCUTANEOUS | Status: AC
  Administered 2023-07-28: 6 mg via SUBCUTANEOUS
  Filled 2023-07-28: qty 0.6

## 2023-07-28 MED ORDER — HEPARIN SOD (PORK) LOCK FLUSH 100 UNIT/ML IV SOLN
500.0000 [IU] | Freq: Once | INTRAVENOUS | Status: AC | PRN
  Administered 2023-07-28: 500 [IU]

## 2023-07-28 NOTE — Progress Notes (Signed)
 Patient presents today for pump d/c. Vital signs are stable. Port a cath site clean, dry, and intact. Port flushed with 10 mls of Normal Saline and 500 Units of Heparin. Needle removed intact. Band aid applied. Patient has no complaints at this time. Discharged from clinic ambulatory and in stable condition. Patient alert and oriented. All follow ups as scheduled.   Bodi Palmeri [employer]

## 2023-07-29 ENCOUNTER — Inpatient Hospital Stay

## 2023-08-03 ENCOUNTER — Telehealth: Payer: Self-pay | Admitting: *Deleted

## 2023-08-03 ENCOUNTER — Other Ambulatory Visit: Payer: Self-pay | Admitting: *Deleted

## 2023-08-03 ENCOUNTER — Inpatient Hospital Stay

## 2023-08-03 VITALS — BP 119/83 | HR 89 | Temp 97.9°F | Resp 18

## 2023-08-03 DIAGNOSIS — C155 Malignant neoplasm of lower third of esophagus: Secondary | ICD-10-CM

## 2023-08-03 LAB — CBC WITH DIFFERENTIAL/PLATELET
Abs Immature Granulocytes: 0 10*3/uL (ref 0.00–0.07)
Basophils Absolute: 0.3 10*3/uL — ABNORMAL HIGH (ref 0.0–0.1)
Basophils Relative: 2 %
Eosinophils Absolute: 0.1 10*3/uL (ref 0.0–0.5)
Eosinophils Relative: 1 %
HCT: 33.1 % — ABNORMAL LOW (ref 39.0–52.0)
Hemoglobin: 10.4 g/dL — ABNORMAL LOW (ref 13.0–17.0)
Lymphocytes Relative: 13 %
Lymphs Abs: 1.8 10*3/uL (ref 0.7–4.0)
MCH: 29.3 pg (ref 26.0–34.0)
MCHC: 31.4 g/dL (ref 30.0–36.0)
MCV: 93.2 fL (ref 80.0–100.0)
Monocytes Absolute: 0.3 10*3/uL (ref 0.1–1.0)
Monocytes Relative: 2 %
Neutro Abs: 11.5 10*3/uL — ABNORMAL HIGH (ref 1.7–7.7)
Neutrophils Relative %: 82 %
Platelets: 147 10*3/uL — ABNORMAL LOW (ref 150–400)
RBC: 3.55 MIL/uL — ABNORMAL LOW (ref 4.22–5.81)
RDW: 16.3 % — ABNORMAL HIGH (ref 11.5–15.5)
WBC: 14 10*3/uL — ABNORMAL HIGH (ref 4.0–10.5)
nRBC: 0 % (ref 0.0–0.2)

## 2023-08-03 LAB — COMPREHENSIVE METABOLIC PANEL WITH GFR
ALT: 35 U/L (ref 0–44)
AST: 26 U/L (ref 15–41)
Albumin: 3.1 g/dL — ABNORMAL LOW (ref 3.5–5.0)
Alkaline Phosphatase: 161 U/L — ABNORMAL HIGH (ref 38–126)
Anion gap: 10 (ref 5–15)
BUN: 11 mg/dL (ref 8–23)
CO2: 24 mmol/L (ref 22–32)
Calcium: 8.7 mg/dL — ABNORMAL LOW (ref 8.9–10.3)
Chloride: 101 mmol/L (ref 98–111)
Creatinine, Ser: 0.62 mg/dL (ref 0.61–1.24)
GFR, Estimated: >60 mL/min (ref >60)
Glucose, Bld: 227 mg/dL — ABNORMAL HIGH (ref 70–99)
Potassium: 3.8 mmol/L (ref 3.5–5.1)
Sodium: 135 mmol/L (ref 135–145)
Total Bilirubin: 0.5 mg/dL (ref 0.0–1.2)
Total Protein: 6.3 g/dL — ABNORMAL LOW (ref 6.5–8.1)

## 2023-08-03 LAB — MAGNESIUM: Magnesium: 1.8 mg/dL (ref 1.7–2.4)

## 2023-08-03 MED ORDER — MAGNESIUM SULFATE 2 GM/50ML IV SOLN
2.0000 g | Freq: Once | INTRAVENOUS | Status: AC
  Administered 2023-08-03: 2 g via INTRAVENOUS
  Filled 2023-08-03: qty 50

## 2023-08-03 MED ORDER — SODIUM CHLORIDE 0.9% FLUSH
10.0000 mL | INTRAVENOUS | Status: AC
  Administered 2023-08-03: 10 mL

## 2023-08-03 MED ORDER — HEPARIN SOD (PORK) LOCK FLUSH 100 UNIT/ML IV SOLN
500.0000 [IU] | Freq: Once | INTRAVENOUS | Status: AC | PRN
  Administered 2023-08-03: 500 [IU]

## 2023-08-03 MED ORDER — SODIUM CHLORIDE 0.9% FLUSH
10.0000 mL | Freq: Once | INTRAVENOUS | Status: AC | PRN
  Administered 2023-08-03: 10 mL

## 2023-08-03 MED ORDER — POTASSIUM CHLORIDE IN NACL 20-0.9 MEQ/L-% IV SOLN
Freq: Once | INTRAVENOUS | Status: AC
  Filled 2023-08-03: qty 1000

## 2023-08-03 NOTE — Telephone Encounter (Signed)
 Patient's wife called to advise that he has become dehydrated since last treatment secondary to diarrhea and vomiting.  Will bring in for labs and fluids today.  Dr. Cheree Cords aware.

## 2023-08-03 NOTE — Progress Notes (Signed)
 Patient presents today for house IVF.  Patient has had weakness, anorexia, and nausea x one week.  Vital signs are stable.  We will proceed with IVF per orders by Dr. Cheree Cords.  Patient tolerated infusion well with no complaints voiced.  Patient left ambulatory with wife in stable condition.  Vital signs stable at discharge.  Follow up as scheduled.

## 2023-08-03 NOTE — Patient Instructions (Signed)

## 2023-08-31 ENCOUNTER — Other Ambulatory Visit: Payer: Self-pay | Admitting: Internal Medicine

## 2023-08-31 DIAGNOSIS — I10 Essential (primary) hypertension: Secondary | ICD-10-CM

## 2023-09-01 ENCOUNTER — Other Ambulatory Visit: Payer: Self-pay | Admitting: Hematology

## 2023-09-05 ENCOUNTER — Other Ambulatory Visit: Payer: Self-pay | Admitting: Hematology

## 2023-09-07 ENCOUNTER — Inpatient Hospital Stay: Attending: Hematology

## 2023-09-07 ENCOUNTER — Encounter: Payer: Self-pay | Admitting: Hematology

## 2023-09-07 VITALS — BP 134/72 | HR 66 | Temp 97.1°F | Resp 18 | Wt 221.4 lb

## 2023-09-07 DIAGNOSIS — Z5111 Encounter for antineoplastic chemotherapy: Secondary | ICD-10-CM | POA: Insufficient documentation

## 2023-09-07 DIAGNOSIS — C155 Malignant neoplasm of lower third of esophagus: Secondary | ICD-10-CM | POA: Diagnosis present

## 2023-09-07 DIAGNOSIS — Z79899 Other long term (current) drug therapy: Secondary | ICD-10-CM | POA: Diagnosis not present

## 2023-09-07 MED ORDER — HEPARIN SOD (PORK) LOCK FLUSH 100 UNIT/ML IV SOLN
500.0000 [IU] | Freq: Once | INTRAVENOUS | Status: AC
  Administered 2023-09-07: 500 [IU] via INTRAVENOUS

## 2023-09-07 MED ORDER — SODIUM CHLORIDE 0.9 % IV SOLN: INTRAVENOUS | Status: DC

## 2023-09-07 MED ORDER — SODIUM CHLORIDE 0.9% FLUSH
10.0000 mL | Freq: Once | INTRAVENOUS | Status: AC
  Administered 2023-09-07: 10 mL via INTRAVENOUS

## 2023-09-07 MED ORDER — SODIUM CHLORIDE 0.9 % IV SOLN
1000.0000 mg | Freq: Once | INTRAVENOUS | Status: AC
  Administered 2023-09-07: 1000 mg via INTRAVENOUS
  Filled 2023-09-07: qty 1000

## 2023-09-07 NOTE — Patient Instructions (Signed)

## 2023-09-07 NOTE — Progress Notes (Signed)
 Patient tolerated iron infusion with no complaints voiced.  Port IV site clean and dry with good blood return noted before and after infusion.  Band aid applied.  VSS with discharge and left in satisfactory condition with no s/s of distress noted.

## 2023-09-08 ENCOUNTER — Encounter (HOSPITAL_COMMUNITY)
Admission: EM | Disposition: A | Discharge status: Home / Self Care | Payer: Self-pay | Source: Home / Self Care | Attending: Emergency Medicine

## 2023-09-08 ENCOUNTER — Emergency Department (HOSPITAL_COMMUNITY): Admitting: Anesthesiology

## 2023-09-08 ENCOUNTER — Encounter (HOSPITAL_COMMUNITY): Payer: Self-pay | Admitting: *Deleted

## 2023-09-08 ENCOUNTER — Emergency Department (HOSPITAL_COMMUNITY)

## 2023-09-08 ENCOUNTER — Ambulatory Visit (HOSPITAL_COMMUNITY)
Admission: EM | Admit: 2023-09-08 | Discharge: 2023-09-08 | Disposition: A | Discharge status: Home / Self Care | Attending: Emergency Medicine | Admitting: Emergency Medicine

## 2023-09-08 ENCOUNTER — Other Ambulatory Visit: Payer: Self-pay

## 2023-09-08 ENCOUNTER — Encounter: Payer: Self-pay | Admitting: *Deleted

## 2023-09-08 ENCOUNTER — Other Ambulatory Visit: Payer: Self-pay | Admitting: Internal Medicine

## 2023-09-08 DIAGNOSIS — C159 Malignant neoplasm of esophagus, unspecified: Secondary | ICD-10-CM | POA: Diagnosis present

## 2023-09-08 DIAGNOSIS — W44F3XA Food entering into or through a natural orifice, initial encounter: Secondary | ICD-10-CM | POA: Diagnosis not present

## 2023-09-08 DIAGNOSIS — K259 Gastric ulcer, unspecified as acute or chronic, without hemorrhage or perforation: Secondary | ICD-10-CM | POA: Diagnosis not present

## 2023-09-08 DIAGNOSIS — Z7901 Long term (current) use of anticoagulants: Secondary | ICD-10-CM | POA: Diagnosis not present

## 2023-09-08 DIAGNOSIS — K2211 Ulcer of esophagus with bleeding: Secondary | ICD-10-CM | POA: Diagnosis not present

## 2023-09-08 DIAGNOSIS — G4733 Obstructive sleep apnea (adult) (pediatric): Secondary | ICD-10-CM | POA: Insufficient documentation

## 2023-09-08 DIAGNOSIS — I4891 Unspecified atrial fibrillation: Secondary | ICD-10-CM | POA: Insufficient documentation

## 2023-09-08 DIAGNOSIS — T18128A Food in esophagus causing other injury, initial encounter: Secondary | ICD-10-CM

## 2023-09-08 DIAGNOSIS — Z85828 Personal history of other malignant neoplasm of skin: Secondary | ICD-10-CM | POA: Insufficient documentation

## 2023-09-08 DIAGNOSIS — I10 Essential (primary) hypertension: Secondary | ICD-10-CM | POA: Diagnosis not present

## 2023-09-08 DIAGNOSIS — Z9221 Personal history of antineoplastic chemotherapy: Secondary | ICD-10-CM | POA: Diagnosis not present

## 2023-09-08 DIAGNOSIS — E119 Type 2 diabetes mellitus without complications: Secondary | ICD-10-CM | POA: Diagnosis not present

## 2023-09-08 DIAGNOSIS — Z923 Personal history of irradiation: Secondary | ICD-10-CM | POA: Diagnosis not present

## 2023-09-08 DIAGNOSIS — E038 Other specified hypothyroidism: Secondary | ICD-10-CM

## 2023-09-08 DIAGNOSIS — K219 Gastro-esophageal reflux disease without esophagitis: Secondary | ICD-10-CM | POA: Insufficient documentation

## 2023-09-08 DIAGNOSIS — T18198A Other foreign object in esophagus causing other injury, initial encounter: Secondary | ICD-10-CM

## 2023-09-08 HISTORY — PX: ESOPHAGOGASTRODUODENOSCOPY: SHX5428

## 2023-09-08 LAB — URINALYSIS, ROUTINE W REFLEX MICROSCOPIC
Bilirubin Urine: NEGATIVE
Glucose, UA: NEGATIVE mg/dL
Hgb urine dipstick: NEGATIVE
Ketones, ur: NEGATIVE mg/dL
Leukocytes,Ua: NEGATIVE
Nitrite: NEGATIVE
Protein, ur: NEGATIVE mg/dL
Specific Gravity, Urine: 1.009 (ref 1.005–1.030)
pH: 8 (ref 5.0–8.0)

## 2023-09-08 LAB — BASIC METABOLIC PANEL WITH GFR
Anion gap: 12 (ref 5–15)
BUN: 12 mg/dL (ref 8–23)
CO2: 22 mmol/L (ref 22–32)
Calcium: 9.4 mg/dL (ref 8.9–10.3)
Chloride: 106 mmol/L (ref 98–111)
Creatinine, Ser: 0.63 mg/dL (ref 0.61–1.24)
GFR, Estimated: >60 mL/min (ref >60)
Glucose, Bld: 157 mg/dL — ABNORMAL HIGH (ref 70–99)
Potassium: 3.9 mmol/L (ref 3.5–5.1)
Sodium: 140 mmol/L (ref 135–145)

## 2023-09-08 LAB — CBC
HCT: 37.9 % — ABNORMAL LOW (ref 39.0–52.0)
Hemoglobin: 12.2 g/dL — ABNORMAL LOW (ref 13.0–17.0)
MCH: 30.1 pg (ref 26.0–34.0)
MCHC: 32.2 g/dL (ref 30.0–36.0)
MCV: 93.6 fL (ref 80.0–100.0)
Platelets: 166 K/uL (ref 150–400)
RBC: 4.05 MIL/uL — ABNORMAL LOW (ref 4.22–5.81)
RDW: 14.4 % (ref 11.5–15.5)
WBC: 5.9 K/uL (ref 4.0–10.5)
nRBC: 0 % (ref 0.0–0.2)

## 2023-09-08 LAB — TSH: TSH: 11.65 u[IU]/mL — ABNORMAL HIGH (ref 0.350–4.500)

## 2023-09-08 SURGERY — EGD (ESOPHAGOGASTRODUODENOSCOPY)
Anesthesia: General

## 2023-09-08 MED ORDER — PROPOFOL 10 MG/ML IV BOLUS
INTRAVENOUS | Status: DC | PRN
Start: 2023-09-08 — End: 2023-09-08
  Administered 2023-09-08: 180 mg via INTRAVENOUS

## 2023-09-08 MED ORDER — MORPHINE SULFATE (PF) 4 MG/ML IV SOLN
4.0000 mg | Freq: Once | INTRAVENOUS | Status: AC
  Administered 2023-09-08: 4 mg via INTRAVENOUS
  Filled 2023-09-08: qty 1

## 2023-09-08 MED ORDER — OMEPRAZOLE 40 MG PO CPDR: 40.0000 mg | DELAYED_RELEASE_CAPSULE | Freq: Two times a day (BID) | ORAL | 3 refills | Status: DC

## 2023-09-08 MED ORDER — IOHEXOL 300 MG/ML  SOLN
75.0000 mL | Freq: Once | INTRAMUSCULAR | Status: AC | PRN
  Administered 2023-09-08: 75 mL via INTRAVENOUS

## 2023-09-08 MED ORDER — METHYLPREDNISOLONE SODIUM SUCC 125 MG IJ SOLR
125.0000 mg | Freq: Once | INTRAMUSCULAR | Status: AC
  Administered 2023-09-08: 125 mg via INTRAVENOUS
  Filled 2023-09-08: qty 2

## 2023-09-08 MED ORDER — SODIUM CHLORIDE 0.9 % IV BOLUS
1000.0000 mL | Freq: Once | INTRAVENOUS | Status: AC
  Administered 2023-09-08: 1000 mL via INTRAVENOUS

## 2023-09-08 MED ORDER — SUCCINYLCHOLINE CHLORIDE 200 MG/10ML IV SOSY
PREFILLED_SYRINGE | INTRAVENOUS | Status: DC | PRN
Start: 2023-09-08 — End: 2023-09-08
  Administered 2023-09-08: 100 mg via INTRAVENOUS

## 2023-09-08 MED ORDER — PROPOFOL 500 MG/50ML IV EMUL
INTRAVENOUS | Status: AC
Start: 2023-09-08 — End: 2023-09-08
  Filled 2023-09-08: qty 50

## 2023-09-08 MED ORDER — FAMOTIDINE IN NACL 20-0.9 MG/50ML-% IV SOLN
20.0000 mg | Freq: Once | INTRAVENOUS | Status: AC
  Administered 2023-09-08: 20 mg via INTRAVENOUS
  Filled 2023-09-08: qty 50

## 2023-09-08 MED ORDER — FENTANYL CITRATE (PF) 100 MCG/2ML IJ SOLN
INTRAMUSCULAR | Status: DC | PRN
  Administered 2023-09-08 (×2): 50 ug via INTRAVENOUS

## 2023-09-08 MED ORDER — DIPHENHYDRAMINE HCL 50 MG/ML IJ SOLN
25.0000 mg | Freq: Once | INTRAMUSCULAR | Status: AC
  Administered 2023-09-08: 25 mg via INTRAVENOUS
  Filled 2023-09-08: qty 1

## 2023-09-08 MED ORDER — GLUCAGON HCL RDNA (DIAGNOSTIC) 1 MG IJ SOLR
1.0000 mg | Freq: Once | INTRAMUSCULAR | Status: AC
  Administered 2023-09-08: 1 mg via INTRAVENOUS
  Filled 2023-09-08: qty 1

## 2023-09-08 MED ORDER — ONDANSETRON HCL 4 MG/2ML IJ SOLN
4.0000 mg | Freq: Once | INTRAMUSCULAR | Status: AC
  Administered 2023-09-08: 4 mg via INTRAVENOUS
  Filled 2023-09-08: qty 2

## 2023-09-08 MED ORDER — LACTATED RINGERS IV SOLN: INTRAVENOUS | Status: DC | PRN

## 2023-09-08 MED ORDER — FENTANYL CITRATE (PF) 100 MCG/2ML IJ SOLN
INTRAMUSCULAR | Status: AC
  Filled 2023-09-08: qty 2

## 2023-09-08 MED ORDER — SUCRALFATE 1 GM/10ML PO SUSP: 1.0000 g | Freq: Two times a day (BID) | ORAL | 0 refills | Status: DC

## 2023-09-08 NOTE — Anesthesia Preprocedure Evaluation (Signed)
 Anesthesia Evaluation  Patient identified by MRN, date of birth, ID band Patient awake    Reviewed: Allergy & Precautions, H&P , NPO status , Patient's Chart, lab work & pertinent test results, reviewed documented beta blocker date and time   Airway Mallampati: II  TM Distance: >3 FB Neck ROM: full    Dental no notable dental hx.    Pulmonary neg pulmonary ROS, sleep apnea    Pulmonary exam normal breath sounds clear to auscultation       Cardiovascular Exercise Tolerance: Good hypertension, negative cardio ROS + dysrhythmias  Rhythm:regular Rate:Normal     Neuro/Psych   Anxiety     negative neurological ROS  negative psych ROS   GI/Hepatic negative GI ROS, Neg liver ROS,GERD  ,,  Endo/Other  negative endocrine ROSdiabetesHypothyroidism    Renal/GU negative Renal ROS  negative genitourinary   Musculoskeletal   Abdominal   Peds  Hematology negative hematology ROS (+)   Anesthesia Other Findings   Reproductive/Obstetrics negative OB ROS                              Anesthesia Physical Anesthesia Plan  ASA: 3 and emergent  Anesthesia Plan: General   Post-op Pain Management:    Induction:   PONV Risk Score and Plan: Propofol  infusion  Airway Management Planned:   Additional Equipment:   Intra-op Plan:   Post-operative Plan:   Informed Consent: I have reviewed the patients History and Physical, chart, labs and discussed the procedure including the risks, benefits and alternatives for the proposed anesthesia with the patient or authorized representative who has indicated his/her understanding and acceptance.     Dental Advisory Given  Plan Discussed with: CRNA  Anesthesia Plan Comments:         Anesthesia Quick Evaluation

## 2023-09-08 NOTE — Anesthesia Procedure Notes (Signed)
 Procedure Name: Intubation Date/Time: 09/08/2023 8:42 PM  Performed by: Kendell Yvonna PARAS, MDPre-anesthesia Checklist: Patient identified, Emergency Drugs available, Suction available, Patient being monitored and Timeout performed Patient Re-evaluated:Patient Re-evaluated prior to induction Oxygen Delivery Method: Circle system utilized Preoxygenation: Pre-oxygenation with 100% oxygen Induction Type: IV induction Laryngoscope Size: Glidescope and 4 Grade View: Grade IV Tube type: Oral Tube size: 7.0 mm Number of attempts: 2 Airway Equipment and Method: Video-laryngoscopy and Stylet Placement Confirmation: ETT inserted through vocal cords under direct vision, positive ETCO2 and breath sounds checked- equal and bilateral Secured at: 24 cm Tube secured with: Tape Dental Injury: Teeth and Oropharynx as per pre-operative assessment  Difficulty Due To: Difficulty was unanticipated Future Recommendations: Recommend- induction with short-acting agent, and alternative techniques readily available Comments: No view with Glidescope 3.  With Glidescope 4, there was a grade 4 view.

## 2023-09-08 NOTE — ED Notes (Signed)
 Talk to [facility] with Endo, answered all questions she had. Will be getting patient shortly.
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
## 2023-09-08 NOTE — ED Provider Notes (Signed)
 [place] EMERGENCY DEPARTMENT AT [facility] Provider Note   CSN: [id number] Arrival date & time: 09/08/23  1444     Patient presents with: Oral Swelling   Samuel Hubbard is a 68 y.o. male.   Pt is a 68 yo male with pmhx significant for htn, spinal stenosis, hypothyroidism, GERD, OSA on CPAP, DM2, kidney stones, hx pheochromocytoma, left eye cancer, afib (on Eliquis ), and esophageal cancer (poorly differentiated adenocarcinoma).  Pt has been getting chemo, which he just finished, prior to getting an esophagectomy which is scheduled for 8/12.  Pt had low iron recently, so he had an iron infusion yesterday.  Since the infusion, he's been having trouble swallowing.  He feels like his esophagus is closed up.  He's not able to take any fluids or any of his pills.  He had pureed soup yesterday and does not feel like anything is stuck.         Prior to Admission medications   Medication Sig Start Date End Date Taking? Authorizing Provider  apixaban  (ELIQUIS ) 5 MG TABS tablet Take 1 tablet (5 mg total) by mouth 2 (two) times daily. Patient taking differently: Take 5 mg by mouth daily. 06/02/23  Yes [name], MD  atorvastatin  (LIPITOR) 40 MG tablet TAKE 1 TABLET BY MOUTH EVERY DAY 09/26/22  Yes [name], MD  citalopram  (CELEXA ) 20 MG tablet TAKE 1 TABLET BY MOUTH EVERY DAY 10/16/22  Yes Dixon, Phillip E, MD  fexofenadine-pseudoephedrine (ALLEGRA-D 24) 180-240 MG 24 hr tablet Take 1 tablet by mouth once as needed (after iron infusions).   Yes [provider]  HYDROcodone -acetaminophen  (NORCO/VICODIN) 5-325 MG tablet Take 1 tablet by mouth every 8 (eight) hours as needed for moderate pain (pain score 4-6). 09/07/23  Yes Rogers Hai, MD  levothyroxine  (SYNTHROID ) 112 MCG tablet TAKE 2 TABLETS BY MOUTH PRIOR TO BREAKFAST 06/20/22  Yes [name], MD  lidocaine -prilocaine  (EMLA ) cream Apply to affected area once 02/12/23  Yes Rogers Hai, MD   metFORMIN  (GLUCOPHAGE -XR) 500 MG 24 hr tablet Take 2 tablets (1,000 mg total) by mouth 2 (two) times daily with a meal. Patient taking differently: Take 1,000 mg by mouth daily after breakfast. 05/29/22 02/03/24 Yes [name], MD  metoprolol  tartrate (LOPRESSOR ) 25 MG tablet Take 1 tablet (25 mg total) by mouth 2 (two) times daily. Patient taking differently: Take 25 mg by mouth daily. 06/01/23  Yes [name], MD  nitroGLYCERIN  (NITROSTAT ) 0.4 MG SL tablet Place 1 tablet (0.4 mg total) under the tongue every 5 (five) minutes as needed for chest pain. 06/06/22  Yes Del Orbe Polanco, Hilario, FNP  olmesartan  (BENICAR ) 40 MG tablet TAKE 1 TABLET BY MOUTH EVERY DAY 08/31/23  Yes Bevely Doffing, FNP  blood glucose meter kit and supplies Dispense based on patient and insurance preference. Once daily testing DX E11.9 11/06/21   Paseda, Folashade R, FNP  glucose blood test strip Use as instructed 11/06/21   Paseda, Folashade R, FNP  Lancets 30G MISC Once daily testing dx e11.9 11/06/21   Paseda, Folashade R, FNP    Allergies: Other, Wasp venom, and Zolpidem    Review of Systems  HENT:  Positive for trouble swallowing.   All other systems reviewed and are negative.   Updated Vital Signs BP (!) 137/93   Pulse (!) 102   Temp 98.5 F (36.9 C)   Resp 19   Ht 6' (1.829 m)   Wt 99.8 kg   SpO2 93%  BMI 29.84 kg/m   Physical Exam Vitals and nursing note reviewed.  Constitutional:      Appearance: Normal appearance.  HENT:     Head: Normocephalic and atraumatic.     Comments: No visible swelling to lips or to oropharynx    Right Ear: External ear normal.     Left Ear: External ear normal.     Nose: Nose normal.     Mouth/Throat:     Mouth: Mucous membranes are dry.  Eyes:     Extraocular Movements: Extraocular movements intact.     Conjunctiva/sclera: Conjunctivae normal.     Pupils: Pupils are equal, round, and reactive to light.  Cardiovascular:     Rate and Rhythm: Normal  rate and regular rhythm.     Pulses: Normal pulses.     Heart sounds: Normal heart sounds.  Pulmonary:     Effort: Pulmonary effort is normal.     Breath sounds: Normal breath sounds.  Abdominal:     General: Abdomen is flat. Bowel sounds are normal.     Palpations: Abdomen is soft.  Musculoskeletal:        General: Normal range of motion.     Cervical back: Normal range of motion and neck supple.  Skin:    General: Skin is warm.     Capillary Refill: Capillary refill takes less than 2 seconds.  Neurological:     General: No focal deficit present.     Mental Status: He is alert and oriented to person, place, and time.  Psychiatric:        Mood and Affect: Mood normal.        Behavior: Behavior normal.     (all labs ordered are listed, but only abnormal results are displayed) Labs Reviewed  CBC - Abnormal; Notable for the following components:      Result Value   RBC 4.05 (*)    Hemoglobin 12.2 (*)    HCT 37.9 (*)    All other components within normal limits  BASIC METABOLIC PANEL WITH GFR - Abnormal; Notable for the following components:   Glucose, Bld 157 (*)    All other components within normal limits  TSH - Abnormal; Notable for the following components:   TSH 11.650 (*)    All other components within normal limits  URINALYSIS, ROUTINE W REFLEX MICROSCOPIC    EKG: EKG Interpretation Date/Time:  Tuesday September 08 2023 15:53:12 EDT Ventricular Rate:  61 PR Interval:  191 QRS Duration:  102 QT Interval:  455 QTC Calculation: 459 R Axis:   16  Text Interpretation: Sinus rhythm Abnormal R-wave progression, early transition No significant change since last tracing Confirmed by Dean Clarity [phone]) on 09/08/2023 5:00:09 PM  Radiology: CT Soft Tissue Neck W Contrast Result Date: 09/08/2023 CLINICAL DATA:  Head/neck cancer, monitor difficulty swallowing EXAM: CT NECK WITH CONTRAST TECHNIQUE: Multidetector CT imaging of the neck was performed using the standard  protocol following the bolus administration of intravenous contrast. RADIATION DOSE REDUCTION: This exam was performed according to the departmental dose-optimization program which includes automated exposure control, adjustment of the mA and/or kV according to patient size and/or use of iterative reconstruction technique. CONTRAST:  75mL OMNIPAQUE  IOHEXOL  300 MG/ML  SOLN COMPARISON:  PET-CT fusion study dated April 13, 2023. FINDINGS: Pharynx and larynx: Normal. No mass or swelling. Salivary glands: No inflammation, mass, or stone. Thyroid : Status post total thyroidectomy. No masses are present within the thyroid  fossa. There are several surgical clips. Lymph nodes: There  are few shotty cervical lymph nodes present bilaterally. There are no pathologically enlarged lymph nodes present. Vascular: Negative. Limited intracranial: Unremarkable. Visualized orbits: Negative. Mastoids and visualized paranasal sinuses: Negative. Skeleton: Mild degenerative disc disease.  No osseous lesions. Upper chest: The visualized lungs are clear. A right internal jugular chest port is present. Other: The thoracic esophagus is distended with air and fluid. IMPRESSION: 1. The visualized thoracic esophagus is distended with air in fluid, suggesting reflux or distal stricture or obstruction. 2. Status post thyroidectomy. Electronically Signed   By: Evalene Coho M.D.   On: 09/08/2023 18:03   DG Chest Port 1 View Result Date: 09/08/2023 CLINICAL DATA:  Cough. Difficulty swallowing starting yesterday. Recent chemotherapy for esophageal cancer. EXAM: PORTABLE CHEST 1 VIEW COMPARISON:  PET-CT 04/13/2023 FINDINGS: Power injectable right Port-A-Cath tip: SVC. The lungs appear clear. Cardiac and mediastinal contours normal. No blunting of the costophrenic angles. IMPRESSION: 1. No active cardiopulmonary disease is radiographically apparent. 2. Power injectable right Port-A-Cath tip: SVC. Electronically Signed   By: Ryan Salvage  M.D.   On: 09/08/2023 15:33     Procedures   Medications Ordered in the ED  diphenhydrAMINE  (BENADRYL ) injection 25 mg (25 mg Intravenous Given 09/08/23 1546)  methylPREDNISolone  sodium succinate (SOLU-MEDROL ) 125 mg/2 mL injection 125 mg (125 mg Intravenous Given 09/08/23 1547)  famotidine  (PEPCID ) IVPB 20 mg premix (0 mg Intravenous Stopped 09/08/23 1617)  sodium chloride  0.9 % bolus 1,000 mL (0 mLs Intravenous Stopped 09/08/23 1716)  ondansetron  (ZOFRAN ) injection 4 mg (4 mg Intravenous Given 09/08/23 1636)  morphine  (PF) 4 MG/ML injection 4 mg (4 mg Intravenous Given 09/08/23 1636)  iohexol  (OMNIPAQUE ) 300 MG/ML solution 75 mL (75 mLs Intravenous Contrast Given 09/08/23 1705)  glucagon  (human recombinant) (GLUCAGEN ) injection 1 mg (1 mg Intravenous Given 09/08/23 1826)                                    Medical Decision Making Amount and/or Complexity of Data Reviewed Labs: ordered. Radiology: ordered.  Risk Prescription drug management. Decision regarding hospitalization.   This patient presents to the ED for concern of difficulty swallowing, this involves an extensive number of treatment options, and is a complaint that carries with it a high risk of complications and morbidity.  The differential diagnosis includes allergic rxn, food bolus   Co morbidities that complicate the patient evaluation   htn, spinal stenosis, hypothyroidism, GERD, OSA on CPAP, DM2, kidney stones, hx pheochromocytoma, left eye cancer, afib (on Eliquis ), and esophageal cancer (poorly differentiated adenocarcinoma)   Additional history obtained:  Additional history obtained from epic chart review External records from outside source obtained and reviewed including wife   Lab Tests:  I Ordered, and personally interpreted labs.  The pertinent results include:  cbc with hgb 12.2 (cbc 10.8 on 7/10); bmp nl; tsh elevated at 11.650; ua neg   Imaging Studies ordered:  I ordered imaging studies  including cxr and soft tissue neck  I independently visualized and interpreted imaging which showed  CXR: No active cardiopulmonary disease is radiographically apparent.  2. Power injectable right Port-A-Cath tip: SVC.  CT neck:  The visualized thoracic esophagus is distended with air in fluid,  suggesting reflux or distal stricture or obstruction.  2. Status post thyroidectomy.   I agree with the radiologist interpretation   Medicines ordered and prescription drug management:  I ordered medication including moprhine  for sx  Reevaluation of  the patient after these medicines showed that the patient improved I have reviewed the patients home medicines and have made adjustments as needed   Test Considered:  ct   Critical Interventions:  Ivfs/pain meds/gi   Consultations Obtained:  I requested consultation with the gastroenterologist (Dr. Eartha),  and discussed lab and imaging findings as well as pertinent plan - he will take pt and do an endoscopy.   Problem List / ED Course:  Esophageal impaction:  pt has esophageal cancer so has narrowing in his esophagus.  He likely has some food stuck inside.  CT scan confirms likely stricture.  Pt d/w Dr. Eartha who will take him to EGD.  I did treat for allergy in case sx were due to the iron infusion.  However, this made little difference.   Reevaluation:  After the interventions noted above, I reevaluated the patient and found that they have :improved   Social Determinants of Health:  Lives at home   Dispostion:  After consideration of the diagnostic results and the patients response to treatment, I feel that the patent would benefit from EGD.       Final diagnoses:  Esophageal obstruction due to food impaction  Malignant neoplasm of esophagus, unspecified location [facility])    ED Discharge Orders     None          Dean Clarity, MD 09/08/23 2139

## 2023-09-08 NOTE — Discharge Instructions (Signed)
 You are being discharged to home.  Take a full liquid diet. Please do not eat any solid food or chunks (even small pieces) - do not blend food. Omeprazole  40 mg twice a day - Should open capsule and swallow granules - can mix with applesauce. Sucralfate  1 g twice a day - slurry. Check with pharmacist which medications can be crushed. Will need to reach oncologist/PCP to discuss options for non-crushable medications. Hold Eliquis  for now. Follow up closely with Dr. [name] regarding esophagogastrectomy.

## 2023-09-08 NOTE — Anesthesia Postprocedure Evaluation (Signed)
 Anesthesia Post Note  Patient: Samuel Hubbard  Procedure(s) Performed: EGD (ESOPHAGOGASTRODUODENOSCOPY)  Patient location during evaluation: PACU Anesthesia Type: General Level of consciousness: awake and alert Pain management: pain level controlled Vital Signs Assessment: post-procedure vital signs reviewed and stable Respiratory status: spontaneous breathing, nonlabored ventilation, respiratory function stable and patient connected to nasal cannula oxygen Cardiovascular status: blood pressure returned to baseline and stable Postop Assessment: no apparent nausea or vomiting Anesthetic complications: no   No notable events documented.   Last Vitals:  Vitals:   09/08/23 2000 09/08/23 2123  BP: (!) 147/93 (!) 138/92  Pulse: 97 (!) 101  Resp: 19 (!) 21  Temp:  36.9 C  SpO2: 95% 98%    Last Pain:  Vitals:   09/08/23 2123  TempSrc:   PainSc: 0-No pain                 [name]

## 2023-09-08 NOTE — Op Note (Signed)
 [facility] Patient Name: Samuel Hubbard Procedure Date: 09/08/2023 8:03 PM MRN: [id number] Date of Birth: 03/08/1955 Attending MD: Toribio Fortune , , [id number] CSN: [id number] Age: 68 Admit Type: Outpatient Procedure:                Upper GI endoscopy Indications:              Foreign body in the esophagus Providers:                Toribio Fortune, Olam Ada, RN, Gordy Lonni Balm, Technician Referring MD:             Alean Stands, MD Medicines:                Monitored Anesthesia Care Complications:            No immediate complications. Estimated Blood Loss:     Estimated blood loss: none. Procedure:                Pre-Anesthesia Assessment:                           - Prior to the procedure, a History and Physical                            was performed, and patient medications, allergies                            and sensitivities were reviewed. The patient's                            tolerance of previous anesthesia was reviewed.                           - The risks and benefits of the procedure and the                            sedation options and risks were discussed with the                            patient. All questions were answered and informed                            consent was obtained.                           After obtaining informed consent, the endoscope was                            passed under direct vision. Throughout the                            procedure, the patient's blood pressure, pulse, and                            oxygen saturations  were monitored continuously. The                            [id number]-H190 ([id number]) scope was introduced through the                            mouth, and advanced to the lower third of                            esophagus. The upper GI endoscopy was somewhat                            difficult. The patient tolerated the procedure                            well.  The [id number]-[id number] ([id number]) scope was introduced                            through the mouth, and advanced to the second part                            of duodenum. Scope In: 8:39:10 PM Scope Out: 9:15:33 PM Total Procedure Duration: 0 hours 36 minutes 23 seconds  Findings:      Food was found in the lower third of the esophagus. Removal was       accomplished with a Roth net.      A pill was found in the lower third of the esophagus, 36 cm from the       incisors. Removal was accomplished with a Raptor grasping device.      After removal of the impacted foreign body, a thorough inspection of the       distal esophagus. A confluent area of diffuse ulceration with contract       friability and spontaneus oozing of blood - this extended between 35 to       40 cm from the incisors. These area could only be traversed with the       ultra slim scope given concern for ongoing bleeding and luminal       narrowing. Notably, the ulceration extended distally to the cardia,       which appeared to be worse compared to prior EUS.      One non-bleeding cratered gastric ulcer with a clean ulcer base (Forrest       Class III) was found in the cardia. The lesion was 20 mm in largest       dimension. This ulcer appeared to be extending from the GE junction.      The examined duodenum was normal. Impression:               - Food was found in the esophagus. Removal was                            successful.                           - A pill was found in the esophagus. Removal was  successful.                           - Diffuse esophageal ulcerated tissue with oozing                            blood. This was concerning for residual cancer.                           - Non-bleeding gastric ulcer with a clean ulcer                            base (Forrest Class III). Likely extending from                            esophagus.                           - Normal examined  duodenum. Moderate Sedation:      Per Anesthesia Care Recommendation:           - Discharge patient to home (ambulatory).                           - Full liquid diet. Please do not eat any solid                            food or chunks (even small pieces) - do not blend                            food. Can do protein shakes three times a day.                           - Omeprazole  40 mg twice a day - Should open                            capsule and swallow granules - can mix with                            applesauce.                           - Sucralfate  1 g twice a day - slurry.                           - Check with pharmacist which medications can be                            crushed. Will need to reach oncologist/PCP to                            discuss options for non-crushable medications.                           - Hold Eliquis  for now.                           -  Follow up closely with Dr. [name] regarding                            esophagogastrectomy - Will fax report of endoscopy. Procedure Code(s):        --- Professional ---                           [phone], Esophagogastroduodenoscopy, flexible,                            transoral; with removal of foreign body(s) Diagnosis Code(s):        --- Professional ---                           U81.871J, Food in esophagus causing other injury,                            initial encounter                           T18.198A, Other foreign object in esophagus causing                            other injury, initial encounter                           K22.11, Ulcer of esophagus with bleeding                           K25.9, Gastric ulcer, unspecified as acute or                            chronic, without hemorrhage or perforation                           T18.108A, Unspecified foreign body in esophagus                            causing other injury, initial encounter CPT copyright 2022 [employer] Association. All rights  reserved. The codes documented in this report are preliminary and upon coder review may  be revised to meet current compliance requirements. Toribio Fortune, MD Toribio Fortune,  09/08/2023 9:50:03 PM This report has been signed electronically. Number of Addenda: 0

## 2023-09-08 NOTE — Progress Notes (Signed)
 Patient called stating that he had first dose of iron yesterday and has not felt well since.  At this point, he states that he feels like throat is closing up.  Unable to swallow even very small amounts of fluid.  Denies other symptoms.  Patient advised to go to the ER now.  Verbalized understanding.  Dr. Katragadda made aware.

## 2023-09-08 NOTE — ED Triage Notes (Signed)
 Pt had first iron infusion yesterday, began to have trouble swallowing since yesterday afternoon.  Pt had last chemo tx for esophageal CA recently.  Pt states he is not able to drink or swallow anything. Notified Dr. MARLA of symptoms-concerned for possible reaction.

## 2023-09-08 NOTE — Transfer of Care (Signed)
 Immediate Anesthesia Transfer of Care Note  Patient: Samuel Hubbard  Procedure(s) Performed: EGD (ESOPHAGOGASTRODUODENOSCOPY)  Patient Location: PACU  Anesthesia Type:General  Level of Consciousness: awake and alert   Airway & Oxygen Therapy: Patient Spontanous Breathing  Post-op Assessment: Report given to RN and Post -op Vital signs reviewed and stable  Post vital signs: Reviewed and stable  Last Vitals:  Vitals Value Taken Time  BP 138/92 09/08/23 21:23  Temp 36.9 C 09/08/23 21:23  Pulse 101 09/08/23 21:23  Resp 21 09/08/23 21:23  SpO2 98 % 09/08/23 21:23    Last Pain:  Vitals:   09/08/23 2123  TempSrc:   PainSc: 0-No pain         Complications: No notable events documented.

## 2023-09-09 ENCOUNTER — Other Ambulatory Visit: Payer: Self-pay | Admitting: *Deleted

## 2023-09-09 ENCOUNTER — Encounter (INDEPENDENT_AMBULATORY_CARE_PROVIDER_SITE_OTHER): Payer: Self-pay

## 2023-09-09 ENCOUNTER — Encounter (HOSPITAL_COMMUNITY): Payer: Self-pay | Admitting: Gastroenterology

## 2023-09-09 ENCOUNTER — Other Ambulatory Visit: Payer: Self-pay

## 2023-09-09 ENCOUNTER — Telehealth: Payer: Self-pay | Admitting: *Deleted

## 2023-09-09 ENCOUNTER — Telehealth (INDEPENDENT_AMBULATORY_CARE_PROVIDER_SITE_OTHER): Payer: Self-pay

## 2023-09-09 DIAGNOSIS — K259 Gastric ulcer, unspecified as acute or chronic, without hemorrhage or perforation: Secondary | ICD-10-CM | POA: Insufficient documentation

## 2023-09-09 DIAGNOSIS — E1169 Type 2 diabetes mellitus with other specified complication: Secondary | ICD-10-CM

## 2023-09-09 MED ORDER — METFORMIN HCL 500 MG PO TABS: 1000.0000 mg | ORAL_TABLET | Freq: Two times a day (BID) | ORAL | 3 refills | Status: DC

## 2023-09-09 MED ORDER — HYDROCODONE-ACETAMINOPHEN 7.5-325 MG/15ML PO SOLN: 10.0000 mL | Freq: Three times a day (TID) | ORAL | 0 refills | Status: DC | PRN

## 2023-09-09 MED ORDER — ENOXAPARIN SODIUM 150 MG/ML IJ SOSY: 150.0000 mg | PREFILLED_SYRINGE | INTRAMUSCULAR | 3 refills | Status: DC

## 2023-09-09 NOTE — Telephone Encounter (Signed)
 Thanks.  Please let him know that he cannot have tablets and unfortunately his insurance will not cover liquid so he should not take this medication. Thanks

## 2023-09-09 NOTE — Telephone Encounter (Signed)
 I spoke with the patient and made him aware per Dr. Eartha, Please let him know that he cannot have tablets and unfortunately his insurance will not cover liquid so he should not take this medication. Patient states understanding and will call the pharmacy to see what the cash price for the medication would be.

## 2023-09-09 NOTE — Telephone Encounter (Signed)
 Looks like the insurance would like the tablets sent in, that the patient would have to make into a slurry. Thanks  Date: 09/09/2023 Enrollee Name: Emmons Semel Member Number: [id number] Coverage of your drug was denied We denied coverage under Medicare Part D for the following drug(s) you or your prescribing provider asked for: SUCRALFATE  Suspension Why was coverage for this drug denied? We denied coverage for this drug because: The requested drug is not on your plan's formulary (list of covered drugs). Your Medicare Part D drug plan was asked to cover a drug that is not on the formulary (this is called a formulary exception). Your prescriber did not provide the detailed information that is required in order to approve the request. To receive a formulary exception, per the Part D Coverage Determination guidance Section 40.5.2 and 40.5.3, your prescriber must provide information that documents at least one of the following has occurred: - You have tried the formulary drugs for the treatment of your condition and they did not work for you. OR - The formulary drugs could cause adverse effects. OR - The formulary drugs would be less effective for your condition than the requested drug. Talk to your prescriber to see if the following covered alternative(s) would be right for you: sucralfate  tablet misoprostol tablet Share this notice with your prescribing provider and discuss next steps. If your prescribing provider asked for coverage for this drug on your behalf, we already shared this denial notice with them.

## 2023-09-09 NOTE — Telephone Encounter (Signed)
 Patient discharged to home with recommendations that all medications be crushed at this point.  Per Dr. Katragadda, will start on Hycet 10 ML every 8 hours as needed.  In addition, his Eliquis  was discontinued per Dr. Henreitta.  Script sent for Lovenox  1.5mg /kg (150 mg) sq daily, per Dr. Charmain request.  Patient is to stop 24 hour prior to upcoming surgery on 09/29/23.  PCP notified that metformin  will need to be changed to IR in order to crush.  Spoke with wife about above information and she Verbalized understanding.

## 2023-09-12 ENCOUNTER — Other Ambulatory Visit: Payer: Self-pay | Admitting: Internal Medicine

## 2023-09-12 DIAGNOSIS — E038 Other specified hypothyroidism: Secondary | ICD-10-CM

## 2023-09-15 ENCOUNTER — Emergency Department (HOSPITAL_COMMUNITY)
Admission: EM | Admit: 2023-09-15 | Discharge: 2023-09-15 | Discharge status: Against Medical Advice | Source: Home / Self Care

## 2023-09-17 ENCOUNTER — Other Ambulatory Visit: Payer: Self-pay | Admitting: Internal Medicine

## 2023-09-21 ENCOUNTER — Inpatient Hospital Stay: Admitting: Dietician

## 2023-09-21 ENCOUNTER — Inpatient Hospital Stay: Attending: Hematology | Admitting: Hematology

## 2023-09-21 ENCOUNTER — Inpatient Hospital Stay: Admitting: Hematology

## 2023-09-21 VITALS — BP 124/81 | HR 73 | Temp 98.2°F | Resp 18 | Wt 207.2 lb

## 2023-09-21 DIAGNOSIS — Z79899 Other long term (current) drug therapy: Secondary | ICD-10-CM | POA: Insufficient documentation

## 2023-09-21 DIAGNOSIS — Z9221 Personal history of antineoplastic chemotherapy: Secondary | ICD-10-CM | POA: Insufficient documentation

## 2023-09-21 DIAGNOSIS — C155 Malignant neoplasm of lower third of esophagus: Secondary | ICD-10-CM | POA: Diagnosis present

## 2023-09-21 DIAGNOSIS — Z931 Gastrostomy status: Secondary | ICD-10-CM | POA: Diagnosis not present

## 2023-09-21 DIAGNOSIS — I4819 Other persistent atrial fibrillation: Secondary | ICD-10-CM | POA: Diagnosis not present
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
## 2023-09-21 NOTE — Patient Instructions (Addendum)
 [facility]  Discharge Instructions  You were seen and examined today by Dr. Rogers.  Proceed with surgery as scheduled in August.  Follow-up as scheduled.  Thank you for choosing [facility] to provide your oncology and hematology care.   To afford each patient quality time with our provider, please arrive at least 15 minutes before your scheduled appointment time. You may need to reschedule your appointment if you arrive late (10 or more minutes). Arriving late affects you and other patients whose appointments are after yours.  Also, if you miss three or more appointments without notifying the office, you may be dismissed from the clinic at the provider's discretion.    Again, thank you for choosing [facility].  Our hope is that these requests will decrease the amount of time that you wait before being seen by our physicians.   If you have a lab appointment with the Cancer Center - please note that after April 8th, all labs will be drawn in the cancer center.  You do not have to check in or register with the main entrance as you have in the past but will complete your check-in at the cancer center.            _____________________________________________________________  Should you have questions after your visit to [facility], please contact our office at [phone] and follow the prompts.  Our office hours are 8:00 a.m. to 4:30 p.m. Monday - Thursday and 8:00 a.m. to 2:30 p.m. Friday.  Please note that voicemails left after 4:00 p.m. may not be returned until the following business day.  We are closed weekends and all major holidays.  You do have access to a nurse 24-7, just call the main number to the clinic [phone] and do not press any options, hold on the line and a nurse will answer the phone.    For prescription refill requests, have your pharmacy contact our office and allow 72 hours.     Masks are no longer required in the cancer centers. If you would like for your care team to wear a mask while they are taking care of you, please let them know. You may have one support person who is at least 67 years old accompany you for your appointments.

## 2023-09-21 NOTE — Progress Notes (Signed)
 Nutrition Follow-up:  Patient with malignant neoplasm of lower third esophagus. He has completed neoadjuvant FLOT q14d x 8 cycles. Planning for esophagectomy at [facility] (8/12).  7/22 - EGD with successful removal of food and pill in lower third of esophagus (full liquid diet order per GI)  Met with patient and wife in clinic. Patient reports drinking 2-3 Boost VHC. These are very filling. Patient is trying to drink small amounts of strained soups prepared by wife in addition to supplements. He is getting tired of ice cream. Patient reports recent constipation r/t oxycodone  taken for knee pain. He presented to ED for evaluation on 7/29. Wife had given Fleet enema prior to this. Patient reports having large BM while waiting. He felt better and informed staff that he no longer needed to be seen. Patient reports stool is loose s/p LD order. Patient has decreased pain medication to once daily at bedtime. He denies nausea, vomiting.   Medications: reviewed   Labs: 7/22 - glucose 157  Anthropometrics: Wt 207 lb 3.7 oz today decreased 6% in 2 weeks which is severe for time frame  7/21 - 221 lb 6.4 oz 6/9 - 217 lb 8 oz 5/12 - 219 lb 9.3 oz   Estimated Energy Needs - revised for wt loss   Kcals: 2630-3000 Protein: 132-160 Fluid: >2.6L  NUTRITION DIAGNOSIS: Food and nutrition related knowledge deficit - improving    INTERVENTION:  Continue Boost VHC - encouraged to increase to 4/day as tolerated  Encourage cream-based well strained soups, ice cream, shakes with ice cream + boost in between Boost Support and encouragement    MONITORING, EVALUATION, GOAL: wt trends, intake, surgery   NEXT VISIT: To be scheduled post-op

## 2023-10-02 NOTE — Progress Notes (Addendum)
 Case Management Discharge Planning Note  Barriers identified?  None  EDD: 10/04/2023  Preferred DC location: Home  Medical Readiness for Discharge:  Reassessment Documentation Goals prior to discharge: Reach TF goal rate Mobility: ambulates independently Anticipated care needs at discharge include: Tube Feeds/TPN Anticipated care needs comment: Referral to [facility] Preferred Discharge Location: Home  Team recommendations and collaboration for discharge planning Home self care  Pt / Patient representative conversation and collaboration for discharge planning Patient with new J tube following aborted esophagectomy procedure.  Reviewed patient choice for TF and patient chose [facility].  Feeds slowly being titrated up.  Discharge goal is home following TF goal and teaching. Patient and/or their representative have participated in and are in agreement with discharge plan. yes  CM Next Steps: Follow for medical stability Coordinate TF following authorization Complete Final ADT documentation Complete CM DC Summary / Closing Note DC needs are likely but unclear; will continue to monitor closely with reassessments  Coordinated Resources:  No resources indicated at this time  11:19 AM Update from team that patient will be discharging 8/16.  [facility] is in the process of scheduling education with patient and wife and delivery of supplies.  Update to nursing who will continue to work with patient for education regarding feed and meds.

## 2023-10-02 NOTE — Progress Notes (Addendum)
 Nutrition Note  Reason for visit: Follow-up  Current Nutrition: Diet: Full liquid Oral Nutrition Supplements: Ensure Plus High Protein (assorted) with all meals.  Provides 350 kcals, 20g protein per serving  % meal intakes per flowsheets:  None documented in flowsheets at this time.   Enteral Nutrition (Tube Feeding)  Access: J-tube Formula: Nutren 1.5 TF Schedule: advancing by 15 mL q8hr to goal of 65 mL/hr  Total TF Volume Provided: 1560 mL Kcal Provided: 2340 kcal Protein Provided: 106 g CHO Provided: 275 g TF Free Water Provided: 1192 mL Flush Solution: water Flush Amount/Schedule: 60 mL q 4 hr   TF Intake (per MAR):  8/15 30-45 mL/hr  8/14 15-30 mL/hr   Biochemical Data & Medications nutritionally relevant): Labs: Recent Labs  Lab 09/28/23 0838 09/29/23 1408 09/30/23 0557  NA 138 137 137  K 4.7 4.3 4.3  CL 101 98 103  BUN 21* 17 13  CREATININE 0.7 0.6 0.8  GLUCOSE 181* 169* 171*  CALCIUM  9.4 8.5* 8.0*  MG  --  1.7* 2.1   Lab Results  Component Value Date   FOLATE >22.0 08/27/2023   VITB12 1,149 (H) 08/27/2023   Recent Labs    10/01/23 1159 10/01/23 1853 10/01/23 2348 10/02/23 0557 10/02/23 1209  POCGLU 123 131 177* 167* 181*   Last BM: 10/01/23 Unable to visualize/flushed (10/01/23 1856)  Estimated Nutritional Needs:  Calculation Weight Used: Admission weight (97.5 kg) Energy: 2000-2400 kcal/day (~20-25 kcal/kg) Protein:  98-146 g/day (1.0-1.5 g/kg) Fluid:  1 mL/kcal or per team  Nutrition Evaluation: Visited pt today for high risk nutrition follow-up though pt sleeping soundly during RD visit x3. Per d/w RN, pt tolerating TF well so far- currently at 45 mL/hr continuous. Per chart review, it does not appear pt is taking anything po. Pt previously drinking Boost VHC prior to admit- will replace current order or Ensure for Boost VHC. If pt able to drink Boost VHC consisently, can consider decreasing goal tube feed. Lytes remain stable. Continue to  monitor for refeeding.    Nutrition Diagnosis: -NI-2.1 Inadequate oral intake related to dysphagia, esophageal cancer as evidenced by clear liquids, no EN via J-tube yet - improving with TF advancing to goal    Malnutrition Assessment: Diagnosis: mild protein-calorie malnutrition (09/30/23 1151) in the context of chronic illness Criteria: >10% unintentional weight loss in 6 months Intervention: Recommend enteral nutrition support;Recommend oral nutrition (as tolerated) (09/30/23 1151)   Nutrition Recommendations/Interventions:  PO diet per team: currently on clear liquids  Enteral Nutrition (via J-tube) Continue Nutren 1.5, advance by 15 mL q8hr to goal of 65 mL/hr continuous  Regimen provides 1560 mL formula, 2340 kcals, 106 g protein, 275 g CHO, and1192 mL water in formula (+flushes). Meets 100% of est needs  Minimum 30 ml q4hr flushes to maintain tube patency. Pt may require additional flushes to meet hydration needs (currently at 60 mL q4hr per team) Once tolerating continuous feeds at goal, transition to cycled feeds: initiate at 75 mL/hr, advance by 10 mL q8hr to goal of 95 mL/hr x 16 hours  Provides ~same nutrition as above  Can trial outpatient as well  Can adjust TF pending po/ONS intake  Switch to Boost Very High Calorie (assorted) with all meals.  Provides 530 kcals, 22g protein per serving Monitor electrolytes and replete prn-check phos Continue scheduled bowel regimen to promote regularity. Daily weights to trend per order  The patient would benefit from ongoing nutrition care in the outpatient setting.  If they receive care  from the [facility] please consider a referral to outpatient oncology nutrition [phone]).   Assessed at: 0733  Time Spent: 40 Minute(s)   Ronal Comer Crouch, MS RD LDN

## 2023-10-02 NOTE — Progress Notes (Addendum)
 Home Enteral Arrangements     Home Infusion Pharmacy: [facility] Phone: [phone] Fax: [phone]   Nursing Agency/Phone: Pending    SOC/ROC: Pending     Patient/Caregiver aware of services being provided? Yes   Delivery Date and Location: hospital room 10/02/23     Services Arranged:  Enteral tube feeds Nutren 1.5 Via pump All supplies for enteral tube feeding

## 2023-10-02 NOTE — Progress Notes (Signed)
   10/02/23 1204  Discharge Plan  Second IMM Provided to patient - in person

## 2023-10-03 NOTE — Progress Notes (Signed)
 Arrived at [facility] [phone]) for Enteral Feed teach and hookup. Taught patient and PCG step by step instructions of the Enteral infusion therapy including flushing the J-Tube and administering the enteral feed as directed. Reviewed side effects and tube process of flushing.  Observed patient/caregiver give return demonstration of infinity pump use, flushing and administering the enteral feed correctly and safely as ordered. Patient/caregiver competent to self-administer enteral feed as ordered. Instructed patient/caregiver on signs and symptoms to report to RN/MD such as ss infection or other abnormality; provided emergency contact number for the agency and instructed on the on call process.  Patient connected to a new bag of Enteral feed during this visit. Infinity pump set to infuse at the rate 37mL/hr over 24 hours. Patient and PCG instructed to change bag every 24 hours.  Patient/caregiver verbalized understanding of all instructions.

## 2023-10-03 NOTE — Progress Notes (Signed)
 Case Manager Discharge Summary / Closing Note  Expected Discharge Date & Time: 10/03/2023    Discharge Plan:  The patient and patient representative(s) have been involved in the development of this plan and are in agreement.    Post-Acute Services Coordinated: Dialysis/Infusion - Admitted Since 09/29/2023     Service Provider Services Address Phone Fax Patient Preferred   [facility]  Home Infusion and Injection [address], [place] [address] [phone] Yes      Infusion Documentation    Flowsheet Row Most Recent Value  Agency Info / Branch Phone [facility]  phone: [phone]    fax: [phone]  Start of Care 10/03/23  Medication delivery date and location 10/03/23 to bedside  Medications Arranged Nutren 1.5 tube feeding  Teachable Caregiver name & phone number Ernesto Zukowski  [phone]  Line care teaching Not Indicated  Follow Up Provider [facility]  Information Provided This agency has Epic / Medlink access and has agreed to retrieve the order and necessary supporting documentation independently   Transportation: Arrangements: patient arranged Service Arranged: private vehicle Time: 2359  Final ADT:  Final ADT Discharge Disposition: Home Based  Home Based: Home or Self Care    Second IMM Received: Provided to patient - in person (10/02/23 1204)  ELSIE CHRISTELLA DARING

## 2023-10-04 ENCOUNTER — Other Ambulatory Visit (INDEPENDENT_AMBULATORY_CARE_PROVIDER_SITE_OTHER): Payer: Self-pay | Admitting: Gastroenterology

## 2023-10-05 ENCOUNTER — Other Ambulatory Visit: Payer: Self-pay | Admitting: Internal Medicine

## 2023-10-05 ENCOUNTER — Telehealth: Payer: Self-pay

## 2023-10-05 ENCOUNTER — Other Ambulatory Visit: Payer: Self-pay

## 2023-10-05 DIAGNOSIS — E038 Other specified hypothyroidism: Secondary | ICD-10-CM

## 2023-10-05 MED ORDER — LEVOTHYROXINE SODIUM 112 MCG PO TABS: ORAL_TABLET | ORAL | 0 refills | Status: DC

## 2023-10-05 NOTE — Telephone Encounter (Signed)
 Copied from CRM #[id number]. Topic: Clinical - Prescription Issue >> Oct 05, 2023  9:56 AM Sophia H wrote: Reason for CRM: Patient states he is needing a fill on levothyroxine  (SYNTHROID ) 112 MCG tablet, normally see's Dr. Melvenia. States was told at the pharmacy that the refill was denied, please advise patient if he needs to come in for an appt with clinic before any refills are sent in. I didn't see anything scheduled recently.   Patient states it is hard to come in since he is in between surgeries, he is not mobile enough to come in to clinic. # [phone]  [facility] - [place], [place] - [address]

## 2023-10-05 NOTE — Telephone Encounter (Signed)
 Rx sent to pharmacy

## 2023-10-05 NOTE — Transitions of Care (Post Inpatient/ED Visit) (Signed)
 10/05/2023  Patient ID: Samuel Hubbard, male   DOB: 1955/04/24, 68 y.o.   MRN: [id number]  Chart Review for transitions of care. Patient has a Non-[facility] provider.  Chessa Barrasso J. Jalayah Gutridge RN, MSN [facility], [facility] RN Care Manager Direct Dial: [phone]  Fax: [phone] Website: [email or web address]

## 2023-10-05 NOTE — Telephone Encounter (Signed)
 Patient insurance denied the liquid form.

## 2023-10-06 ENCOUNTER — Other Ambulatory Visit: Payer: Self-pay | Admitting: Internal Medicine

## 2023-10-08 NOTE — Progress Notes (Signed)
 Thoracic Surgery Clinic - Return Patient Visit  DIAGNOSIS AND TREATMENT SUMMARY   Diagnosis: Esophageal Cancer   Preoperative Therapy: FLOT chemotherapy 02/16/2023 x 4 cycles completed 04/06/2023 with Dr. Alean Stands. Completed remaining 4 cycles of FLOT on 6/9   Procedure(s): 1. 05/11/23 EGD dilation biopsy                         2. 09/29/23 EGD, robot assisted esophagectomy, feeding jejunostomy by Dr. [name]   Pathology:  1. A. ESOPHAGUS, 38 CM, #2, (FROZEN SECTION) ENDOSCOPIC BIOPSY: ATYPICAL CELLS PRESENT SUSPICIOUS FOR ADENOCARCINOMA. B. ESOPHAGUS, 36 CM, #2, (FROZEN SECTION) ENDOSCOPIC BIOPSY: ATYPICAL CELLS PRESENT SUSPICIOUS FOR ADENOCARCINOMA. C.ESOPHAGUS, 40 CM, ENDOSCOPIC BIOPSY: POORLY DIFFERENTIATED INVASIVE MUCINOUS ADENOCARCINOMA WITH PATCHY SIGNET RING CELLS. D. ESOPHAGUS, 38 CM, #1, ENDOSCOPIC BIOPSY: POORLY DIFFERENTIATED INVASIVE MUCINOUS ADENOCARCINOMA WITH PATCHY SIGNET RING CELLS. E. ESOPHAGUS, 36 CM, ENDOSCOPIC BIOPSY: POORLY DIFFERENTIATED INVASIVE MUCINOUS ADENOCARCINOMA WITH SIGNET RING CELLS. F. STOMACH, ENDOSCOPIC BIOPSY: GASTRIC OXYNTIC MUCOSA WITH NO PATHOLOGIC DIAGNOSIS. NO ACTIVE OR CHRONIC GASTRITIS IS SEEN. NEGATIVE FOR EVIDENCE OF HELICOBACTER PYLORI ON ROUTINE H&E. NEGATIVE FOR CARCINOMA. 2. Pending    Postoperative therapy: TBD  SUBJECTIVE   Current Status  Samuel Hubbard presents to the thoracic surgery clinic for postoperative evaluation for the above procedure.  History of Present Illness His esophagectomy was aborted due to extent of tumor.  The final pathology results are pending and will direct subsequent treatment plan. If positive he will get definitive therapy, if negative the a colon interposition after radiation.  He is currently having regular bowel movements, reporting once a day, which he considers good given his current condition and feeding tube use.   Samuel Hubbard lives in [place], [place]. He is here today with his  wife Samuel Hubbard.  Smoking Status:  Never smoked cigarettes  Anticoagulation/antiplatelet: apixaban  (for Afib)  GLP-1/SGLT-2 status: None   Allergies   Allergies  Allergen Reactions  . Legumes Swelling  . Peanut Swelling    Throat swelling and lips  . Wasp Venom Swelling  . Zolpidem Hallucination     Medications   Current Outpatient Medications  Medication Sig Dispense Refill  . acetaminophen  (TYLENOL ) 160 mg/5 mL suspension Take 20.3 mLs (649.6 mg total) by mouth every 6 (six) hours for 6 days 473 mL 0  . atorvastatin  (LIPITOR) 40 MG tablet Take 40 mg by mouth once daily    . carboxymethylcellulose sodium (REFRESH OPHTH) Place 1 drop into both eyes as needed    . citalopram  (CELEXA ) 10 mg/5 mL suspension Take 10 mLs (20 mg total) by mouth once daily 240 mL 0  . levothyroxine  (SYNTHROID ) 112 MCG tablet Take 224 mcg by mouth once daily    . lidocaine -prilocaine  (EMLA ) cream Apply topically as needed    . magnesium  hydroxide 400 mg/5 mL suspension Take 30 mLs by mouth once daily as needed (1st line treatment) for up to 10 days 118 mL 0  . metFORMIN  (GLUCOPHAGE ) 1000 MG tablet Take 1,000 mg by mouth 2 (two) times daily with meals    . metoprolol  TARTrate (LOPRESSOR ) oral suspension 10 mg/mL Take 2.5 mLs (25 mg total) by tube every 12 (twelve) hours for 30 days **Refrigerate** 150 mL 0  . nitroGLYcerin  (NITROSTAT ) 0.4 MG SL tablet Place 0.4 mg under the tongue every 5 (five) minutes as needed    . olmesartan  (BENICAR ) 40 MG tablet Take 40 mg by mouth every morning       . omeprazole  2  mg/mL oral suspension Take 20 mLs (40 mg total) by tube 2 (two) times daily for 30 days 1200 mL 0  . oxyCODONE  (ROXICODONE ) 5 mg/5 mL solution Take 5-10 mLs (5-10 mg total) by mouth every 4 (four) hours as needed for up to 5 days 100 mL 0  . sucralfate  (CARAFATE ) 100 mg/mL suspension Take 10 mLs by mouth 2 (two) times daily before meals    . multivitamin tablet Take by mouth once daily 2 Gummies by mouth  daily (Patient not taking: Reported on 10/06/2023)     No current facility-administered medications for this visit.     OBJECTIVE   Vitals:  Vitals:   10/08/23 1057  BP: (!) 146/84  Pulse: 68  Resp: 12  Temp: 36.6 C (97.9 F)  TempSrc: Oral  SpO2: 100%  Weight: 96.7 kg (213 lb 3 oz)  PainSc:   6  PainLoc: Abdomen    Wt Readings from Last 10 Encounters:  10/08/23 96.7 kg (213 lb 3 oz)  10/08/23 96.9 kg (213 lb 10 oz)  10/06/23 99.7 kg (219 lb 12.8 oz)  10/03/23 (!) 102.6 kg (226 lb 3.1 oz)  09/28/23 97.2 kg (214 lb 4.6 oz)  09/25/23 95.3 kg (210 lb)  08/27/23 98.4 kg (216 lb 14.9 oz)  08/27/23 98.4 kg (216 lb 14.9 oz)  06/04/23 (!) 101.8 kg (224 lb 6.9 oz)  05/11/23 (!) 102.8 kg (226 lb 10.1 oz)   ECOG Performance Scale:  1 - Restricted in physically strenuous activity but ambulatory and able to carry our work of a light or sedentary nature, e.g., light house work, office work  Physical Exam  General: well developed, well nourished, and in no acute distress Neurologic: non-focal exam Psych: oriented to time, place, and person; mood and affect are within normal limits; pt is a good historian; no memory problems were noted Skin: no rashes or lesions noted on exposed skin   Neck: Supple; no masses, no tenderness Lymph Nodes: No palpable cervical and supraclavicular lymph nodes Heart: regular rate and rhythm; S1 and S2 present; no murmur, rub, or gallop Breast/Chest Wall: deferred Lungs: clear to ausculatation bilaterally; no rhonchi, wheezing, or crackles GI/Abdomen: nondistended.  J tube in position  Extremities: radial pulses 2+ bilaterally; no edema   DIAGNOSTIC STUDIES  The following studies were personally reviewed in clinic today:  No new studies today  IMPRESSION AND PLAN     ICD-10-CM   1. Malignant neoplasm of lower third of esophagus (CMS/HHS-HCC)  C15.5       Samuel Hubbard is a 68 y.o. male with a history of stage III esophageal adenocarcinoma  treated with FLOT who presents post aborted esophagectomy  with j tube placement.  Final pathology pending    Results of the above studies were discussed with the patient.  Assessment & Plan Esophageal cancer with gastric involvement Esophageal cancer extended into the stomach, complicating surgical options. Preliminary biopsy benign. Surgery halted to avoid incomplete resection. Radiation therapy considered to manage tumor and improve quality of life, allowing swallowing improvement and planning for future surgery. - Coordinate with Doctor Palta for radiation therapy plan based on final pathology. - Explore local radiation therapy options to reduce travel. - Arrange local colonoscopy with gastroenterologist for future surgery planning.  Dysphagia due to esophageal cancer Dysphagia worsening, impacting saliva swallowing. Radiation therapy expected to alleviate swallowing difficulties and improve nutrition. - Proceed with radiation therapy to address dysphagia and improve swallowing.     I personally reviewed the radiographic studies  and evaluated the patient.  I discussed the plan above with the patient and his wife.  Samuel Hubbard expressed an understanding of this discussion and recommendations above.  All questions were answered to their satisfaction.   Clinical Notes on My Chart: Progress notes documented by your healthcare team are available on my chart portal. We encourage you to review notes after visits and in preparation for upcoming appointments. This provides the opportunity to review recommendations as well as prepare questions for your healthcare team to address during your next visit. If you have specific questions related to the notes, please bring them to your next scheduled visit to discuss with your physician, nurse practitioner or physician assistant. With increased transparency, our hope is that we can create better communication, more shared decision-making, and increased  satisfaction. However, if you have questions regarding the plan of care, these may be managed by calling our office or through MyChart.  Patient Care Team: Corum, Olam CROME, MD as PCP - General (Family Medicine) Dyana Perkins, RN as Registered Nurse Rogers Hai, MD (Oncology)

## 2023-10-09 ENCOUNTER — Encounter: Payer: Self-pay | Admitting: Radiology

## 2023-10-14 NOTE — Telephone Encounter (Signed)
 Nurse placed call to patient and his wife to inform them of his CT [facility] appointment that has been scheduled for 10/15/2023 at 9:00 AM. Nurse spoke with the wife, Merlynn, gave her the appointment date and time. Nurse also gave her specific instructions per Dr. [name]. Patient is to remain NPO (including J-Tube feeds) for 4 hours prior to scan and will drink small sips of Gastrografin contrast. Merlynn repeated appointment information and instructions with nurse. Nurse advised for them to call with any other questions or concerns as needed.

## 2023-10-15 ENCOUNTER — Inpatient Hospital Stay (HOSPITAL_BASED_OUTPATIENT_CLINIC_OR_DEPARTMENT_OTHER): Admitting: Oncology

## 2023-10-15 VITALS — BP 122/79 | HR 76 | Temp 98.1°F | Resp 18 | Wt 209.7 lb

## 2023-10-15 DIAGNOSIS — C155 Malignant neoplasm of lower third of esophagus: Secondary | ICD-10-CM

## 2023-10-15 DIAGNOSIS — I4819 Other persistent atrial fibrillation: Secondary | ICD-10-CM | POA: Diagnosis not present

## 2023-10-15 DIAGNOSIS — G6289 Other specified polyneuropathies: Secondary | ICD-10-CM

## 2023-10-15 DIAGNOSIS — R1319 Other dysphagia: Secondary | ICD-10-CM

## 2023-10-15 NOTE — Progress Notes (Signed)
 Patient Care Team: System, Provider Not In as PCP - General Mallipeddi, Samuel SQUIBB, MD as PCP - Cardiology (Cardiology) Hubbard Siad, MD as Medical Oncologist (Medical Oncology) Samuel [name], RN as Oncology Nurse Navigator (Medical Oncology) Dr [facility], OD (Optometry) Samuel, [name], MD as Consulting Physician (Gastroenterology)  Clinic Day:  10/15/2023  Referring physician: [name], MD   CHIEF COMPLAINT:  CC: Stage III (T3 N0 M0 G3) poorly differentiated adenocarcinoma of distal esophagus  Samuel Hubbard 68 y.o. male was transferred to my care after his prior physician has left.   ASSESSMENT & PLAN:   Assessment & Plan: Samuel Hubbard  is a 68 y.o. male with stage III (T3 N0 M0 G3) poorly differentiated adenocarcinoma of distal esophagus  Assessment & Plan Malignant neoplasm of lower third of esophagus (HCC) Patient with stage III adenocarcinoma of distal esophagus S/p perioperative chemotherapy with FLOT X 8 cycles Had an attempted esophagectomy at [facility] but aborted due evidence of bulky tumor at GEJ, splenic, and omental metastases and metastases localized to gastric fundus and body limiting width of potential conduit.  Omental nodule biopsies did not show any malignancy Was evaluated by Dr. [name] at [facility] yesterday and recommended to start chemo RT  - It appears like patient still has bulky tumor in GEJ even after 8 cycles with FLOT - Chemoradiation might be the next best option for the patient to treat this cancer still with a curative intent.  Will consider carboplatin and paclitaxel instead of 5-FU based regimens considering he did have a great response with it previously - Risk versus benefits discussed in detail.Carboplatin and paclitaxel with radiation therapy (RT) can cause side effects such as fatigue, nausea, and increased risk of infection due to bone marrow suppression. Patients may also experience neuropathy, hair loss,  and gastrointestinal symptoms like diarrhea or mucositis. When combined with RT, there's an added risk of localized skin irritation and enhanced radiation-related side effects in the treated area.  Patient is in agreement with the treatment - Will coordinate with Dr. 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  Radiation therapy is expected to improve swallowing by reducing tumor size, although symptoms may worsen before improving due to treatment-related inflammation.  - Monitor swallowing ability during treatment. - Continue PEG tube feeds during this time Other polyneuropathy Secondary to previous chemotherapy Not on any medication currently  -Continue to monitor Persistent atrial fibrillation Samuel Hubbard) Patient has a previous history of atrial fibrillation was on Eliquis .   He was changed to Lovenox  injections as Eliquis  could not be taken via tube  -I will reach out to his cardiologist Dr.Mallipeddi to see if patient has any alternatives   The patient understands the plans discussed today and is in agreement with them.  He knows to contact our office if he develops concerns prior to his next appointment.  90 minutes of total time was spent for this patient encounter, including preparation, face-to-face counseling with the patient and coordination of care, physical exam, and documentation of the encounter. > 50% of the time was spent on counseling as documented under my assessment and plan.    Samuel Hubbard,acting as a Neurosurgeon for Samuel Davonna, MD.,have documented all relevant documentation on the behalf of Samuel Davonna, MD,as directed by  Samuel Davonna, MD while in the presence of Samuel Davonna, MD.  Samuel Samuel Dry MD, have reviewed the above documentation for accuracy and completeness, and I  agree with the above.     Samuel Dry, MD  [facility] [place] - A DEPT OF [facility] [place] [address] [place] [address] Dept: [phone] Dept Fax: [phone]   No orders of the defined types were placed in this encounter.    ONCOLOGY HISTORY:   I have reviewed his chart and materials related to his cancer extensively and collaborated history with the patient. 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  Pathology: Poorly differentiated adenocarcinoma with mucinous and signet ring cell features. -01/05/2023: CT CAP:  Substantial irregular wall thickening of the distal half of the thoracic esophagus extending over a 8.5 cm length, compatible with tumor. No findings of metastatic disease in the chest, abdomen, or pelvis. -01/22/2023: Initial PET: Circumferential wall thickening in the distal third of the thoracic esophagus, with maximum SUV 5.7, compatible with malignancy. -01/29/2023: Germline mutation testing: Negative with the VUS of APC and RET -02/02/2023: EGD/EUS: Large fungating and ulcerating mass in the distal esophagus, at the GE junction extending into the cardia, 32 to 42 cm from the incisors. Mass was partially obstructing and circumferential. Sonographic evidence suggesting invasion into the muscularis propria and adventitia. 1 enlarged lymph node in the lower paraesophageal mediastinum measuring 4 x 3 mm, which was round, hypoechoic and had well-defined margins. FNB not performed.  -[facility] NGS testing: HER2 (0), MS-stable, TMB-low, CLDN 18-negative -02/16/2023-04/09/2023: 4 cycles of neoadjuvant FLOT (ESOPEC trial) -04/23/2023: Patient evaluated by Dr. Medora rodes surgeon], who recommended esophagectomy. Patient was reluctant to undergo surgery, so proceeded with EGD with dilation and biopsy.  -05/11/2023: EGD: Severe erosion in the distal esophagus consistent with the location of the tumor. The esophagus is significantly narrowed. Biopsies taken at 36, 38, 40 cm.  -Pathology ar 36 cm and 38 cm: Atypical cells present suspicious for adenocarcinoma.  -Pathology at 40 cm: Poorly differentiated invasive mucinous adenocarcinoma with patchy signet ring cells.  -06/04/2023: Patient seen by Dr. Sheilda coons oncologist] at [facility] who recommended 4 additional cycles of FLOT and surgical reevaluation. -06/15/2023 - 07/27/2023: 4 more cycles of FLOT completed -08/27/2023: PET: Mild thickening of distal esophagus with mild hypermetabolic activity. No evidence of hypermetabolic disease in the chest, abdomen or pelvis.  -09/29/2023: CT CAP: No measurable peritoneal or omental disease. Small volume trace free fluid versus ill-defined omental/peritoneal implants as above. Diffuse wall thickening of the gastroesophageal junction, compatible with malignancy.  Bilateral lower lung predominant consolidation and groundglass, suspicious for aspiration and atelectasis, infection could have a similar appearance. Trace bilateral pleural effusions.  -09/29/2023: Attempted esophagectomy with Dr.Saltify at [facility]  but aborted due evidence of bulky tumor at [facility], splenic, and omental metastases and metastases localized to gastric fundus and body limiting width of potential conduit. A j-tube was placed. Omental nodules were biopsied -09/29/2023: Right omentum nodule biopsy.  -Pathology of right omentum nodule 2: Mesothelial-lined fibroadipose tissue and smooth muscle with no pathologic change. Negative for malignancy. -Pathology of right omental nodule 1: Fibroadipose tissue with necrosis. Negative for malignancy.  Current Treatment:  Planned chemo RT with carboplatin and  paclitaxel  INTERVAL HISTORY:   Samuel Hubbard  is here today for follow up. Patient is accompanied by his wife today.    He reports some numbness and tingling in his hands and feet, as well as cold sensitivity.  He started using his PEG tube for nutrition, his PEG tube site is sore but is managing feeds through it.  He has changed from Eliquis  to Lovenox  shots due to his swallowing difficulties.  He does not smoke, does not drink alcohol.  He has no other complaints today.   I have reviewed the past medical history, past surgical history, social history and family history with the patient and they are unchanged from previous note.  ALLERGIES:  is allergic to other, wasp venom, and zolpidem.  MEDICATIONS:  Current Outpatient Medications  Medication Sig Dispense Refill   atorvastatin  (LIPITOR) 40 MG tablet TAKE 1 TABLET BY MOUTH DAILY 90 tablet 0   blood glucose meter kit and supplies Dispense based on patient and insurance preference. Once daily testing DX E11.9 1 each 0   citalopram  (CELEXA ) 20 MG tablet TAKE 1 TABLET BY MOUTH EVERY DAY 90 tablet 3   enoxaparin  (LOVENOX ) 150 MG/ML injection Inject 1 mL (150 mg total) into the skin daily. 30 mL 3   glucose blood test strip Use as instructed 100 each 12   Lancets 30G MISC Once daily testing dx e11.9 100 each 5   levothyroxine  (SYNTHROID ) 112 MCG tablet TAKE 2 TABLETS BY MOUTH PRIOR TO BREAKFAST 180 tablet 0   lidocaine -prilocaine  (EMLA ) cream Apply to affected area once 30 g 3   metFORMIN  (GLUCOPHAGE ) 500 MG tablet Take 2 tablets (1,000 mg total) by mouth 2 (two) times daily with a meal. 120 tablet 3   metoprolol  tartrate (LOPRESSOR ) 25 MG tablet Take 1 tablet (25 mg total) by mouth 2 (two) times daily. (Patient taking differently: Take 25 mg by mouth daily.) 180 tablet 90   nitroGLYCERIN  (NITROSTAT ) 0.4 MG SL tablet Place 1 tablet (0.4 mg total) under the tongue every 5 (five) minutes as needed for chest pain. 50 tablet 3   olmesartan   (BENICAR ) 40 MG tablet TAKE 1 TABLET BY MOUTH EVERY DAY 90 tablet 1   omeprazole  (PRILOSEC) 40 MG capsule Take 1 capsule (40 mg total) by mouth 2 (two) times daily. Swallow granules 180 capsule 3   oxyCODONE  (ROXICODONE ) 5 MG/5ML solution Take by mouth.     sucralfate  (CARAFATE ) 1 GM/10ML suspension Take 10 mLs (1 g total) by mouth 2 (two) times daily. 1200 mL 0   TYLENOL  CHILDRENS 160 MG/5ML suspension TAKE 20.3 ML BY MOUTH EVERY 6 HOURS AS NEEDED     No current facility-administered medications for this visit.    REVIEW OF SYSTEMS:   Constitutional: Denies fevers, chills or abnormal weight loss Eyes: Denies blurriness of vision Ears, nose, mouth, throat, and face: Denies mucositis or sore throat Respiratory: Denies cough, dyspnea or wheezes Cardiovascular: Denies palpitation, chest discomfort or lower extremity swelling Gastrointestinal:  Denies nausea, heartburn or change in bowel habits Skin: Denies abnormal skin rashes Lymphatics: Denies new lymphadenopathy or easy bruising Neurological:Denies numbness, tingling or new weaknesses Behavioral/Psych: Mood is stable, no new changes  All other systems were reviewed with the patient and are negative.   VITALS:  Blood pressure 122/79, pulse 76, temperature 98.1 F (36.7 C), temperature source Oral, resp. rate 18, weight 209 lb 10.5 oz (95.1 kg), SpO2 98%.  Wt Readings from Last 3 Encounters:  10/15/23 209 lb 10.5 oz (95.1 kg)  09/21/23 207 lb 3.7 oz (94 kg)  09/08/23 220 lb (99.8 kg)    Body mass index is 28.43 kg/m.  Performance status (ECOG): 1 - Symptomatic but completely ambulatory  PHYSICAL EXAM:   GENERAL:alert, no distress and comfortable SKIN: skin color, texture, turgor are normal, no rashes or significant lesions, port site clean LYMPH:  no palpable lymphadenopathy in the cervical, axillary or inguinal LUNGS: clear to auscultation and percussion with normal breathing effort HEART: regular rate & rhythm and no  murmurs and no lower extremity edema ABDOMEN:abdomen soft, non-tender and normal bowel sounds, PEG tube site clean Musculoskeletal:no cyanosis of digits and no clubbing  NEURO: alert & oriented x 3 with fluent speech, no focal motor/sensory deficits  LABORATORY DATA:  I have reviewed the data as listed  Lab Results  Component Value Date   WBC 5.9 09/08/2023   NEUTROABS 11.5 (H) 08/03/2023   HGB 12.2 (L) 09/08/2023   HCT 37.9 (L) 09/08/2023   MCV 93.6 09/08/2023   PLT 166 09/08/2023     Chemistry      Component Value Date/Time   NA 140 09/08/2023 1604   NA 140 12/01/2022 1513   K 3.9 09/08/2023 1604   CL 106 09/08/2023 1604   CO2 22 09/08/2023 1604   BUN 12 09/08/2023 1604   BUN 17 12/01/2022 1513   CREATININE 0.63 09/08/2023 1604   CREATININE 1.10 10/13/2018 1054      Component Value Date/Time   CALCIUM  9.4 09/08/2023 1604   ALKPHOS 161 (H) 08/03/2023 1302   AST 26 08/03/2023 1302   ALT 35 08/03/2023 1302   BILITOT 0.5 08/03/2023 1302   BILITOT 0.3 12/01/2022 1513      Latest Reference Range & Units 09/08/23 16:04  TSH 0.350 - 4.500 uIU/mL 11.650 (H)  (H): Data is abnormally high  RADIOGRAPHIC STUDIES: I have personally reviewed the radiological images as listed and agreed with the findings in the report.  CT Soft Tissue Neck W Contrast CLINICAL DATA:  Head/neck cancer, monitor difficulty swallowing  EXAM: CT NECK WITH CONTRAST  TECHNIQUE: Multidetector CT imaging of the neck was performed using the standard protocol following the bolus administration of intravenous contrast.  RADIATION DOSE REDUCTION: This exam was performed according to the departmental dose-optimization program which includes automated exposure control, adjustment of the mA and/or kV according to patient size and/or use of iterative reconstruction technique.  CONTRAST:  75mL OMNIPAQUE  IOHEXOL  300 MG/ML  SOLN  COMPARISON:  PET-CT fusion study dated April 13, 2023.  FINDINGS: Pharynx and larynx: Normal. No mass or swelling.  Salivary glands: No inflammation, mass, or stone.  Thyroid : Status post total thyroidectomy. No masses are present within the thyroid  fossa. There are several surgical clips.  Lymph nodes: There are few shotty cervical lymph nodes present bilaterally. There are no pathologically enlarged lymph nodes present.  Vascular: Negative.  Limited intracranial: Unremarkable.  Visualized orbits: Negative.  Mastoids and visualized paranasal sinuses: Negative.  Skeleton: Mild degenerative disc disease.  No osseous lesions.  Upper chest: The visualized lungs are clear. A right internal jugular chest port is present.  Other: The thoracic esophagus is distended with air and fluid.  IMPRESSION: 1. The visualized thoracic esophagus is distended with air in fluid, suggesting reflux or distal stricture or obstruction. 2. Status post thyroidectomy.  Electronically Signed   By: Evalene Coho M.D.   On: 09/08/2023 18:03 DG Chest Port 1 View CLINICAL DATA:  Cough. Difficulty swallowing starting yesterday. Recent chemotherapy for esophageal cancer.  EXAM: PORTABLE CHEST 1 VIEW  COMPARISON:  PET-CT 04/13/2023  FINDINGS: Power injectable right Port-A-Cath tip: SVC.  The lungs appear clear. Cardiac and mediastinal contours normal. No blunting of the costophrenic angles.  IMPRESSION: 1. No active cardiopulmonary disease is radiographically apparent. 2. Power injectable right Port-A-Cath tip: SVC.  Electronically Signed   By: Ryan Salvage M.D.   On: 09/08/2023 15:33

## 2023-10-15 NOTE — Patient Instructions (Signed)
 [facility] at [facility] Discharge Instructions   You were seen and examined today by Dr. Davonna.  She discussed treatment with you. We will plan to treat you here in the clinic weekly with a combination of chemotherapy drugs. Those drugs are Taxol and carboplatin. These are given in conjunction with radiation therapy. Radiation will be given in [place]. This is given Monday-Friday. Chemo will be given for the duration of radiation treatment.    We will start treatment when radiation starts.   Return as scheduled.    Thank you for choosing [facility] to provide your oncology and hematology care.  To afford each patient quality time with our provider, please arrive at least 15 minutes before your scheduled appointment time.   If you have a lab appointment with the Cancer Center please come in thru the Main Entrance and check in at the main information desk.  You need to re-schedule your appointment should you arrive 10 or more minutes late.  We strive to give you quality time with our providers, and arriving late affects you and other patients whose appointments are after yours.  Also, if you no show three or more times for appointments you may be dismissed from the clinic at the providers discretion.     Again, thank you for choosing [facility].  Our hope is that these requests will decrease the amount of time that you wait before being seen by our physicians.       _____________________________________________________________  Should you have questions after your visit to [facility], please contact our office at [phone] and follow the prompts.  Our office hours are 8:00 a.m. and 4:30 p.m. Monday - Friday.  Please note that voicemails left after 4:00 p.m. may not be returned until the following business day.  We are closed weekends and major holidays.  You do have access to a nurse 24-7, just call the  main number to the clinic [phone] and do not press any options, hold on the line and a nurse will answer the phone.    For prescription refill requests, have your pharmacy contact our office and allow 72 hours.    Due to Covid, you will need to wear a mask upon entering the hospital. If you do not have a mask, a mask will be given to you at the Main Entrance upon arrival. For doctor visits, patients may have 1 support person age 67 or older with them. For treatment visits, patients can not have anyone with them due to social distancing guidelines and our immunocompromised population.

## 2023-10-16 ENCOUNTER — Encounter: Payer: Self-pay | Admitting: Oncology

## 2023-10-16 DIAGNOSIS — G629 Polyneuropathy, unspecified: Secondary | ICD-10-CM | POA: Insufficient documentation

## 2023-10-16 NOTE — Assessment & Plan Note (Addendum)
 Patient has a previous history of atrial fibrillation was on Eliquis .   He was changed to Lovenox  injections as Eliquis  could not be taken via tube  -I will reach out to his cardiologist Dr.Mallipeddi to see if patient has any alternatives

## 2023-10-16 NOTE — Assessment & Plan Note (Addendum)
 Dysphagia due to gastroesophageal junction cancer.  Radiation therapy is expected to improve swallowing by reducing tumor size, although symptoms may worsen before improving due to treatment-related inflammation.  - Monitor swallowing ability during treatment. - Continue PEG tube feeds during this time

## 2023-10-16 NOTE — Assessment & Plan Note (Addendum)
 Patient with stage III adenocarcinoma of distal esophagus S/p perioperative chemotherapy with FLOT X 8 cycles Had an attempted esophagectomy at [facility] but aborted due evidence of bulky tumor at GEJ, splenic, and omental metastases and metastases localized to gastric fundus and body limiting width of potential conduit.  Omental nodule biopsies did not show any malignancy Was evaluated by Dr. [name] at [facility] yesterday and recommended to start chemo RT  - It appears like patient still has bulky tumor in GEJ even after 8 cycles with FLOT - Chemoradiation might be the next best option for the patient to treat this cancer still with a curative intent.  Will consider carboplatin and paclitaxel instead of 5-FU based regimens considering he did have a great response with it previously - Risk versus benefits discussed in detail.Carboplatin and paclitaxel with radiation therapy (RT) can cause side effects such as fatigue, nausea, and increased risk of infection due to bone marrow suppression. Patients may also experience neuropathy, hair loss, and gastrointestinal symptoms like diarrhea or mucositis. When combined with RT, there's an added risk of localized skin irritation and enhanced radiation-related side effects in the treated area.  Patient is in agreement with the treatment - Will coordinate with Dr. [name] for starting chemotherapy the same week as radiation -Will monitor for worsening peripheral neuropathy  RTC prior to second cycle of chemotherapy

## 2023-10-16 NOTE — Assessment & Plan Note (Addendum)
 Secondary to previous chemotherapy Not on any medication currently  -Continue to monitor

## 2023-10-17 ENCOUNTER — Encounter: Payer: Self-pay | Admitting: Oncology

## 2023-10-17 NOTE — Progress Notes (Signed)
 DISCONTINUE ON PATHWAY REGIMEN - Gastroesophageal     A cycle is every 14 days:     Docetaxel       Oxaliplatin       Leucovorin       Fluorouracil    **Always confirm dose/schedule in your pharmacy ordering system**  PRIOR TREATMENT: [id number]: FLOT q14 Days x 4 Cycles (2 Months) Preoperative, FLOT q14 Days x 4 Cycles (2 Months) Postoperative  START ON PATHWAY REGIMEN - Gastroesophageal     A cycle is every 7 days, concurrent with RT:     Paclitaxel      Carboplatin   **Always confirm dose/schedule in your pharmacy ordering system**  Patient Characteristics: Esophageal & GE Junction, Adenocarcinoma, Preoperative or Nonsurgical Candidate, M0 (Clinical Staging), cT2 or Higher or cN+, Unresectable/Nonsurgical Candidate (Any cT) Therapeutic Status: Preoperative or Nonsurgical Candidate, M0 (Clinical Staging) Histology: Adenocarcinoma Disease Classification: Esophageal AJCC Grade: G3 AJCC 8 Stage Grouping: III AJCC T Category: cT3 AJCC N Category: cN0 AJCC M Category: cM0 Intent of Therapy: Curative Intent, Discussed with Patient

## 2023-10-17 NOTE — Addendum Note (Signed)
 Addended by: Francisco Ostrovsky on: 10/17/2023 01:37 PM   Modules accepted: Orders

## 2023-10-23 ENCOUNTER — Other Ambulatory Visit: Payer: Self-pay

## 2023-10-23 ENCOUNTER — Other Ambulatory Visit (INDEPENDENT_AMBULATORY_CARE_PROVIDER_SITE_OTHER): Payer: Self-pay | Admitting: Gastroenterology

## 2023-10-30 ENCOUNTER — Encounter: Payer: Self-pay | Admitting: Oncology

## 2023-11-02 ENCOUNTER — Inpatient Hospital Stay

## 2023-11-02 ENCOUNTER — Inpatient Hospital Stay: Admitting: Dietician

## 2023-11-02 ENCOUNTER — Inpatient Hospital Stay: Attending: Hematology

## 2023-11-02 ENCOUNTER — Inpatient Hospital Stay (HOSPITAL_BASED_OUTPATIENT_CLINIC_OR_DEPARTMENT_OTHER): Attending: Hematology | Admitting: Physician Assistant

## 2023-11-02 VITALS — BP 123/77 | HR 77 | Temp 97.0°F | Resp 18

## 2023-11-02 DIAGNOSIS — R1310 Dysphagia, unspecified: Secondary | ICD-10-CM | POA: Diagnosis not present

## 2023-11-02 DIAGNOSIS — G2581 Restless legs syndrome: Secondary | ICD-10-CM | POA: Diagnosis not present

## 2023-11-02 DIAGNOSIS — Z79899 Other long term (current) drug therapy: Secondary | ICD-10-CM | POA: Diagnosis not present

## 2023-11-02 DIAGNOSIS — Z85850 Personal history of malignant neoplasm of thyroid: Secondary | ICD-10-CM

## 2023-11-02 DIAGNOSIS — I4891 Unspecified atrial fibrillation: Secondary | ICD-10-CM | POA: Diagnosis not present

## 2023-11-02 DIAGNOSIS — Z7901 Long term (current) use of anticoagulants: Secondary | ICD-10-CM | POA: Insufficient documentation

## 2023-11-02 DIAGNOSIS — C155 Malignant neoplasm of lower third of esophagus: Secondary | ICD-10-CM

## 2023-11-02 DIAGNOSIS — Z79890 Hormone replacement therapy (postmenopausal): Secondary | ICD-10-CM | POA: Diagnosis not present

## 2023-11-02 DIAGNOSIS — G4700 Insomnia, unspecified: Secondary | ICD-10-CM | POA: Insufficient documentation

## 2023-11-02 DIAGNOSIS — F411 Generalized anxiety disorder: Secondary | ICD-10-CM

## 2023-11-02 DIAGNOSIS — E039 Hypothyroidism, unspecified: Secondary | ICD-10-CM | POA: Diagnosis not present

## 2023-11-02 DIAGNOSIS — G620 Drug-induced polyneuropathy: Secondary | ICD-10-CM | POA: Diagnosis not present

## 2023-11-02 DIAGNOSIS — T451X5A Adverse effect of antineoplastic and immunosuppressive drugs, initial encounter: Secondary | ICD-10-CM | POA: Insufficient documentation

## 2023-11-02 DIAGNOSIS — Z5111 Encounter for antineoplastic chemotherapy: Secondary | ICD-10-CM

## 2023-11-02 DIAGNOSIS — Z95828 Presence of other vascular implants and grafts: Secondary | ICD-10-CM

## 2023-11-02 LAB — MAGNESIUM: Magnesium: 1.6 mg/dL — ABNORMAL LOW (ref 1.7–2.4)

## 2023-11-02 LAB — CBC WITH DIFFERENTIAL/PLATELET
Abs Immature Granulocytes: 0.02 K/uL (ref 0.00–0.07)
Basophils Absolute: 0 K/uL (ref 0.0–0.1)
Basophils Relative: 0 %
Eosinophils Absolute: 0.2 K/uL (ref 0.0–0.5)
Eosinophils Relative: 2 %
HCT: 35 % — ABNORMAL LOW (ref 39.0–52.0)
Hemoglobin: 11.3 g/dL — ABNORMAL LOW (ref 13.0–17.0)
Immature Granulocytes: 0 %
Lymphocytes Relative: 14 %
Lymphs Abs: 1.3 K/uL (ref 0.7–4.0)
MCH: 29.7 pg (ref 26.0–34.0)
MCHC: 32.3 g/dL (ref 30.0–36.0)
MCV: 92.1 fL (ref 80.0–100.0)
Monocytes Absolute: 0.7 K/uL (ref 0.1–1.0)
Monocytes Relative: 8 %
Neutro Abs: 6.8 K/uL (ref 1.7–7.7)
Neutrophils Relative %: 76 %
Platelets: 230 K/uL (ref 150–400)
RBC: 3.8 MIL/uL — ABNORMAL LOW (ref 4.22–5.81)
RDW: 13.4 % (ref 11.5–15.5)
WBC: 9.1 K/uL (ref 4.0–10.5)
nRBC: 0 % (ref 0.0–0.2)

## 2023-11-02 LAB — TSH: TSH: 44.835 u[IU]/mL — ABNORMAL HIGH (ref 0.350–4.500)

## 2023-11-02 LAB — COMPREHENSIVE METABOLIC PANEL WITH GFR
ALT: 14 U/L (ref 0–44)
AST: 13 U/L — ABNORMAL LOW (ref 15–41)
Albumin: 3.2 g/dL — ABNORMAL LOW (ref 3.5–5.0)
Alkaline Phosphatase: 70 U/L (ref 38–126)
Anion gap: 13 (ref 5–15)
BUN: 33 mg/dL — ABNORMAL HIGH (ref 8–23)
CO2: 24 mmol/L (ref 22–32)
Calcium: 8.9 mg/dL (ref 8.9–10.3)
Chloride: 99 mmol/L (ref 98–111)
Creatinine, Ser: 0.77 mg/dL (ref 0.61–1.24)
GFR, Estimated: >60 mL/min (ref >60)
Glucose, Bld: 139 mg/dL — ABNORMAL HIGH (ref 70–99)
Potassium: 3.7 mmol/L (ref 3.5–5.1)
Sodium: 136 mmol/L (ref 135–145)
Total Bilirubin: 0.7 mg/dL (ref 0.0–1.2)
Total Protein: 6.7 g/dL (ref 6.5–8.1)

## 2023-11-02 LAB — T4, FREE: Free T4: 0.81 ng/dL (ref 0.61–1.12)

## 2023-11-02 MED ORDER — DEXAMETHASONE SODIUM PHOSPHATE 10 MG/ML IJ SOLN
10.0000 mg | Freq: Once | INTRAMUSCULAR | Status: AC
  Administered 2023-11-02: 10 mg via INTRAVENOUS
  Filled 2023-11-02: qty 1

## 2023-11-02 MED ORDER — MAGNESIUM SULFATE 2 GM/50ML IV SOLN
2.0000 g | Freq: Once | INTRAVENOUS | Status: AC
Start: 2023-11-02 — End: 2023-11-02
  Administered 2023-11-02: 2 g via INTRAVENOUS
  Filled 2023-11-02: qty 50

## 2023-11-02 MED ORDER — SODIUM CHLORIDE 0.9 % IV SOLN
260.0000 mg | Freq: Once | INTRAVENOUS | Status: AC
  Administered 2023-11-02: 260 mg via INTRAVENOUS
  Filled 2023-11-02: qty 26

## 2023-11-02 MED ORDER — FAMOTIDINE IN NACL 20-0.9 MG/50ML-% IV SOLN
20.0000 mg | Freq: Once | INTRAVENOUS | Status: AC
  Administered 2023-11-02: 20 mg via INTRAVENOUS
  Filled 2023-11-02: qty 50

## 2023-11-02 MED ORDER — LIDOCAINE-PRILOCAINE 2.5-2.5 % EX CREA: TOPICAL_CREAM | CUTANEOUS | 3 refills | Status: DC

## 2023-11-02 MED ORDER — DEXAMETHASONE 4 MG PO TABS: ORAL_TABLET | ORAL | 1 refills | Status: DC

## 2023-11-02 MED ORDER — SODIUM CHLORIDE 0.9 % IV SOLN
50.0000 mg/m2 | Freq: Once | INTRAVENOUS | Status: AC
  Administered 2023-11-02: 108 mg via INTRAVENOUS
  Filled 2023-11-02: qty 18

## 2023-11-02 MED ORDER — LORAZEPAM 2 MG/ML IJ SOLN
0.5000 mg | Freq: Once | INTRAMUSCULAR | Status: AC
  Administered 2023-11-02: 0.5 mg via INTRAVENOUS
  Filled 2023-11-02: qty 1

## 2023-11-02 MED ORDER — PROCHLORPERAZINE MALEATE 10 MG PO TABS: 10.0000 mg | ORAL_TABLET | Freq: Four times a day (QID) | ORAL | 1 refills | Status: DC | PRN

## 2023-11-02 MED ORDER — DIPHENHYDRAMINE HCL 50 MG/ML IJ SOLN
50.0000 mg | Freq: Once | INTRAMUSCULAR | Status: AC
  Administered 2023-11-02: 50 mg via INTRAVENOUS
  Filled 2023-11-02: qty 1

## 2023-11-02 MED ORDER — GABAPENTIN 300 MG PO CAPS: 300.0000 mg | ORAL_CAPSULE | Freq: Every evening | ORAL | 1 refills | Status: DC

## 2023-11-02 MED ORDER — SODIUM CHLORIDE 0.9 % IV SOLN: INTRAVENOUS | Status: DC

## 2023-11-02 MED ORDER — PALONOSETRON HCL INJECTION 0.25 MG/5ML
0.2500 mg | Freq: Once | INTRAVENOUS | Status: AC
  Administered 2023-11-02: 0.25 mg via INTRAVENOUS
  Filled 2023-11-02: qty 5

## 2023-11-02 NOTE — Progress Notes (Signed)
 Nutrition Follow-up:  Patient with malignant neoplasm of lower third esophagus. He has completed neoadjuvant FLOT q14d x 8 cycles. Esophagectomy at [facility] (8/12) aborted due to concerns for gastric tumor. S/p Jtube. Pathology negative for cancer. Patient is now receiving concurrent chemoradiation with weekly carboplatin /paclitaxel  (start 9/15).   7/22 - EGD with successful removal of food and pill in lower third of esophagus (full liquid diet order per GI)  Met with patient in infusion. Patient feeling tired s/p ativan  for worsening RLS with premedications. Patient reports poor sleep with overnight feeds. Per chart, patient ordered for continuous Nutren 1.5 x24h. Radiation oncology educated pt to stop feedings 4 hours prior to treatment. He stopped the pump at 4AM for 9AM radiation. Patient did not bring backpack with him today to give nutrition during treatment.   He eats soups (ground chicken/noodle) for pleasure, however has lost desire to eat. He is drinking water without difficulty. Tolerating some medications orally. He is asking when to take synthroid  with continuous feedings.   He has been taking liquid tylenol  for headaches and shoulder blade pain. Asking if he can take ibprofen as well. Patient feels he may have a kidney stone on left. Complains of left-sided lower back pain which he experienced with previous stones. Patient also endorses episodes of Afib. Heart rate in 150's lasting a couple minutes.   Medications: reviewed   Labs: glucose 139, BUN 33, albumin   Anthropometrics: Wt 212 lb 6.4 oz today - trending up   8/28 - 209 lb 10.5 oz 8/4 - 207 lb 3.7 oz    NUTRITION DIAGNOSIS: Food and nutrition related knowledge deficit ongoing   INTERVENTION:  Will work to transition pt to cyclic feedings. Recommend increasing by 10 ml q24h as tolerated to 130 ml/hr x 12h. Pt giving 20/40 ml water with medications daily (2340 kcal, 106 g, 1186 ml free water + flush/po) Encourage oral intake  of thin liquids as desired Discussed synthroid  may be taken po with continuous feeds given small bowel feedings There is no concern for aspiration with small bowel feeds - per Dr. [name] pt to stop feeds min 2h before treatment due to possibility of back flow into stomach. Patient understanding Nursing provided education on importance of going to ED for afib episodes - pt understanding Pt may alternate liquid tylenol /ibprofen q6 as needed per nursing Written instructions provided     MONITORING, EVALUATION, GOAL: wt trends, JTF, po   NEXT VISIT: Monday September 22 during infusion

## 2023-11-02 NOTE — Patient Instructions (Signed)
 Paclitaxel  (Taxol )  About This Drug Paclitaxel  is a drug used to treat cancer. It is given in the vein (IV).  This will take 1 hour to infuse.  This first infusion will take longer to infuse because it is increased slowly to monitor for reactions.  The nurse will be in the room with you for the first 15 minutes of the first infusion.  Possible Side Effects   Hair loss. Hair loss is often temporary, although with certain medicine, hair loss can sometimes be permanent. Hair loss may happen suddenly or gradually. If you lose hair, you may lose it from your head, face, armpits, pubic area, chest, and/or legs. You may also notice your hair getting thin.   Swelling of your legs, ankles and/or feet (edema)   Flushing   Nausea and throwing up (vomiting)   Loose bowel movements (diarrhea)   Bone marrow depression. This is a decrease in the number of white blood cells, red blood cells, and platelets. This may raise your risk of infection, make you tired and weak (fatigue), and raise your risk of bleeding.   Effects on the nerves are called peripheral neuropathy. You may feel numbness, tingling, or pain in your hands and feet. It may be hard for you to button your clothes, open jars, or walk as usual. The effect on the nerves may get worse with more doses of the drug. These effects get better in some people after the drug is stopped but it does not get better in all people.   Changes in your liver function   Bone, joint and muscle pain   Abnormal EKG   Allergic reaction: Allergic reactions, including anaphylaxis are rare but may happen in some patients. Signs of allergic reaction to this drug may be swelling of the face, feeling like your tongue or throat are swelling, trouble breathing, rash, itching, fever, chills, feeling dizzy, and/or feeling that your heart is beating in a fast or not normal way. If this happens, do not take another dose of this drug. You should get urgent medical  treatment.   Infection   Changes in your kidney function.  Note: Each of the side effects above was reported in 20% or greater of patients treated with paclitaxel . Not all possible side effects are included above.  Warnings and Precautions   Severe allergic reactions   Severe bone marrow depression  Treating Side Effects   To help with hair loss, wash with a mild shampoo and avoid washing your hair every day.   Avoid rubbing your scalp, instead, pat your hair or scalp dry   Avoid coloring your hair   Limit your use of hair spray, electric curlers, blow dryers, and curling irons.   If you are interested in getting a wig, talk to your nurse. You can also call the [employer] at 800-ACS-2345 to find out information about the "Look Good, Feel Better" program close to where you live. It is a free program where women getting chemotherapy can learn about wigs, turbans and scarves as well as makeup techniques and skin and nail care.   Ask your doctor or nurse about medicines that are available to help stop or lessen diarrhea and/or nausea.   To help with nausea and vomiting, eat small, frequent meals instead of three large meals a day. Choose foods and drinks that are at room temperature. Ask your nurse or doctor about other helpful tips and medicine that is available to help or stop lessen these  symptoms.   If you get diarrhea, eat low-fiber foods that are high in protein and calories and avoid foods that can irritate your digestive tracts or lead to cramping. Ask your nurse or doctor about medicine that can lessen or stop your diarrhea.   Mouth care is very important. Your mouth care should consist of routine, gentle cleaning of your teeth or dentures and rinsing your mouth with a mixture of 1/2 teaspoon of salt in 8 ounces of water or  teaspoon of baking soda in 8 ounces of water. This should be done at least after each meal and at bedtime.   If you have mouth sores, avoid  mouthwash that has alcohol. Also avoid alcohol and smoking because they can bother your mouth and throat.   Drink plenty of fluids (a minimum of eight glasses per day is recommended).   Take your temperature as your doctor or nurse tells you, and whenever you feel like you may have a fever.   Talk to your doctor or nurse about precautions you can take to avoid infections and bleeding.   Be careful when cooking, walking, and handling sharp objects and hot liquids.  Food and Drug Interactions   There are no known interactions of paclitaxel  with food.   This drug may interact with other medicines. Tell your doctor and pharmacist about all the medicines and dietary supplements (vitamins, minerals, herbs and others) that you are taking at this time.   The safety and use of dietary supplements and alternative diets are often not known. Using these might affect your cancer or interfere with your treatment. Until more is known, you should not use dietary supplements or alternative diets without your cancer doctor's help.  When to Call the Doctor  Call your doctor or nurse if you have any of the following symptoms and/or any new or unusual symptoms:   Fever of 100.4 F (38 C) or above   Chills   Redness, pain, warmth, or swelling at the IV site during the infusion   Signs of allergic reaction: swelling of the face, feeling like your tongue or throat are swelling, trouble breathing, rash, itching, fever, chills, feeling dizzy, and/or feeling that your heart is beating in a fast or not normal way   Feeling that your heart is beating in a fast or not normal way (palpitations)   Weight gain of 5 pounds in one week (fluid retention)   Decreased urine or very dark urine   Signs of liver problems: dark urine, pale bowel movements, bad stomach pain, feeling very tired and weak, unusual  itching, or yellowing of the eyes or skin   Heavy menstrual period that lasts longer than normal   Easy  bruising or bleeding   Nausea that stops you from eating or drinking, and/or that is not relieved by prescribed medicines.   Loose bowel movements (diarrhea) more than 4 times a day or diarrhea with weakness or lightheadedness   Pain in your mouth or throat that makes it hard to eat or drink   Lasting loss of appetite or rapid weight loss of five pounds in a week   Signs of peripheral neuropathy: numbness, tingling, or decreased feeling in fingers or toes; trouble walking or changes in the way you walk; or feeling clumsy when buttoning clothes, opening jars, or other routine activities   Joint and muscle pain that is not relieved by prescribed medicines   Extreme fatigue that interferes with normal activities   While you are  getting this drug, please tell your nurse right away if you have any pain, redness, or swelling at the site of the IV infusion.   If you think you are pregnant.  Reproduction Warnings   Pregnancy warning: This drug may have harmful effects on the unborn child, it is recommended that effective methods of birth control should be used during your cancer treatment. Let your doctor know right away if you think you may be pregnant.   Breast feeding warning: Women should not breast feed during treatment because this drug could enter the breastmilk and cause harm to a breast feeding baby.   Carboplatin  (Paraplatin , CBDCA)  About This Drug  Carboplatin  is used to treat cancer. It is given in the vein (IV).  It will take 30 minutes to infuse.   Possible Side Effects   Bone marrow suppression. This is a decrease in the number of white blood cells, red blood cells, and platelets. This may raise your risk of infection, make you tired and weak (fatigue), and raise your risk of bleeding.   Nausea and vomiting (throwing up)   Weakness   Changes in your liver function   Changes in your kidney function   Electrolyte changes   Pain  Note: Each of the side effects  above was reported in 20% or greater of patients treated with carboplatin . Not all possible side effects are included above.   Warnings and Precautions   Severe bone marrow suppression   Allergic reactions, including anaphylaxis are rare but may happen in some patients. Signs of allergic reaction to this drug may be swelling of the face, feeling like your tongue or throat are swelling, trouble breathing, rash, itching, fever, chills, feeling dizzy, and/or feeling that your heart is beating in a fast or not normal way. If this happens, do not take another dose of this drug. You should get urgent medical treatment.   Severe nausea and vomiting   Effects on the nerves are called peripheral neuropathy. This risk is increased if you are over the age of 27 or if you have received other medicine with risk of peripheral neuropathy. You may feel numbness, tingling, or pain in your hands and feet. It may be hard for you to button your clothes, open jars, or walk as usual. The effect on the nerves may get worse with more doses of the drug. These effects get better in some people after the drug is stopped but it does not get better in all people.   Blurred vision, loss of vision or other changes in eyesight   Decreased hearing   - Skin and tissue irritation including redness, pain, warmth, or swelling at the IV site if the drug leaks out of the vein and into nearby tissue.   Severe changes in your kidney function, which can cause kidney failure   Severe changes in your liver function, which can cause liver failure  Note: Some of the side effects above are very rare. If you have concerns and/or questions, please discuss them with your medical team.   Important Information   This drug may be present in the saliva, tears, sweat, urine, stool, vomit, semen, and vaginal secretions. Talk to your doctor and/or your nurse about the necessary precautions to take during this time.   Treating Side  Effects   Manage tiredness by pacing your activities for the day.   Be sure to include periods of rest between energy-draining activities.   To decrease the risk of infection, wash  your hands regularly.   Avoid close contact with people who have a cold, the flu, or other infections.   Take your temperature as your doctor or nurse tells you, and whenever you feel like you may have a fever.   To help decrease the risk of bleeding, use a soft toothbrush. Check with your nurse before using dental floss.   Be very careful when using knives or tools.   Use an electric shaver instead of a razor.   Drink plenty of fluids (a minimum of eight glasses per day is recommended).   If you throw up or have loose bowel movements, you should drink more fluids so that you do not become dehydrated (lack of water in the body from losing too much fluid).   To help with nausea and vomiting, eat small, frequent meals instead of three large meals a day. Choose foods and drinks that are at room temperature. Ask your nurse or doctor about other helpful tips and medicine that is available to help stop or lessen these symptoms.   If you have numbness and tingling in your hands and feet, be careful when cooking, walking, and handling sharp objects and hot liquids.   Keeping your pain under control is important to your well-being. Please tell your doctor or nurse if you are experiencing pain.   Food and Drug Interactions   There are no known interactions of carboplatin  with food.   This drug may interact with other medicines. Tell your doctor and pharmacist about all the prescription and over-the-counter medicines and dietary supplements (vitamins, minerals, herbs and others) that you are taking at this time. Also, check with your doctor or pharmacist before starting any new prescription or over-the-counter medicines, or dietary supplements to make sure that there are no interactions.   When to Call the  Doctor  Call your doctor or nurse if you have any of these symptoms and/or any new or unusual symptoms:   Fever of 100.4 F (38 C) or higher   Chills   Tiredness that interferes with your daily activities   Feeling dizzy or lightheaded   Easy bleeding or bruising   Nausea that stops you from eating or drinking and/or is not relieved by prescribed medicines   Throwing up   Blurred vision or other changes in eyesight   Decrease in hearing or ringing in the ear   Signs of allergic reaction: swelling of the face, feeling like your tongue or throat are swelling, trouble breathing, rash, itching, fever, chills, feeling dizzy, and/or feeling that your heart is beating in a fast or not normal way. If this happens, call 911 for emergency care.   Signs of possible liver problems: dark urine, pale bowel movements, bad stomach pain, feeling very tired and weak, unusual itching, or yellowing of the eyes or skin   Decreased urine, or very dark urine   Numbness, tingling, or pain in your hands and feet   Pain that does not go away or is not relieved by prescribed medicine   While you are getting this drug, please tell your nurse right away if you have any pain, redness, or swelling at the site of the IV infusion, or if you have any new onset of symptoms, or if you just feel different from before when the infusion was started.   Reproduction Warnings   Pregnancy warning: This drug may have harmful effects on the unborn baby. Women of child bearing potential should use effective methods of birth control  during your cancer treatment. Let your doctor know right away if you think you may be pregnant.   Breastfeeding warning: It is not known if this drug passes into breast milk. For this reason, women should not breastfeed during treatment because this drug could enter the breast milk and cause harm to a breastfeeding baby.   Fertility warning: Human fertility studies have not been done with  this drug. Talk with your doctor or nurse if you plan to have children. Ask for information on sperm or egg banking.   SELF CARE ACTIVITIES WHILE RECEIVING CHEMOTHERAPY:  Hydration Increase your fluid intake 48 hours prior to treatment and drink at least 8 to 12 cups (64 ounces) of water/decaffeinated beverages per day after treatment. You can still have your cup of coffee or soda but these beverages do not count as part of your 8 to 12 cups that you need to drink daily. No alcohol intake.  Medications Continue taking your normal prescription medication as prescribed.  If you start any new herbal or new supplements please let us  know first to make sure it is safe.  Mouth Care Have teeth cleaned professionally before starting treatment. Keep dentures and partial plates clean. Use soft toothbrush and do not use mouthwashes that contain alcohol. Biotene is a good mouthwash that is available at most pharmacies or may be ordered by calling [phone]. Use warm salt water gargles (1 teaspoon salt per 1 quart warm water) before and after meals and at bedtime. If you need dental work, please let the doctor know before you go for your appointment so that we can coordinate the best possible time for you in regards to your chemo regimen. You need to also let your dentist know that you are actively taking chemo. We may need to do labs prior to your dental appointment.  Skin Care Always use sunscreen that has not expired and with SPF (Sun Protection Factor) of 50 or higher. Wear hats to protect your head from the sun. Remember to use sunscreen on your hands, ears, face, & feet.  Use good moisturizing lotions such as udder cream, eucerin, or even Vaseline. Some chemotherapies can cause dry skin, color changes in your skin and nails.    Avoid long, hot showers or baths. Use gentle, fragrance-free soaps and laundry detergent. Use moisturizers, preferably creams or ointments rather than lotions because the  thicker consistency is better at preventing skin dehydration. Apply the cream or ointment within 15 minutes of showering. Reapply moisturizer at night, and moisturize your hands every time after you wash them.  Hair Loss (if your doctor says your hair will fall out)  If your doctor says that your hair is likely to fall out, decide before you begin chemo whether you want to wear a wig. You may want to shop before treatment to match your hair color. Hats, turbans, and scarves can also camouflage hair loss, although some people prefer to leave their heads uncovered. If you go bare-headed outdoors, be sure to use sunscreen on your scalp. Cut your hair short. It eases the inconvenience of shedding lots of hair, but it also can reduce the emotional impact of watching your hair fall out. Don't perm or color your hair during chemotherapy. Those chemical treatments are already damaging to hair and can enhance hair loss. Once your chemo treatments are done and your hair has grown back, it's OK to resume dyeing or perming hair.  With chemotherapy, hair loss is almost always temporary. But when  it grows back, it may be a different color or texture. In older adults who still had hair color before chemotherapy, the new growth may be completely gray.  Often, new hair is very fine and soft.  Infection Prevention Please wash your hands for at least 30 seconds using warm soapy water. Handwashing is the #1 way to prevent the spread of germs. Stay away from sick people or people who are getting over a cold. If you develop respiratory systems such as green/yellow mucus production or productive cough or persistent cough let us  know and we will see if you need an antibiotic. It is a good idea to keep a pair of gloves on when going into grocery stores/[employer] to decrease your risk of coming into contact with germs on the carts, etc. Carry alcohol hand gel with you at all times and use it frequently if out in public. If your  temperature reaches 100.4 or higher please call the clinic and let us  know.  If it is after hours or on the weekend please go to the ER if your temperature is over 100.4.  Please have your own personal thermometer at home to use.    Sex and bodily fluids If you are going to have sex, a condom must be used to protect the person that isn't taking chemotherapy. Chemo can decrease your libido (sex drive). For a few days after chemotherapy, chemotherapy can be excreted through your bodily fluids.  When using the toilet please close the lid and flush the toilet twice.  Do this for a few day after you have had chemotherapy.   Effects of chemotherapy on your sex life Some changes are simple and won't last long. They won't affect your sex life permanently.  Sometimes you may feel: too tired not strong enough to be very active sick or sore  not in the mood anxious or low  Your anxiety might not seem related to sex. For example, you may be worried about the cancer and how your treatment is going. Or you may be worried about money, or about how you family are coping with your illness.  These things can cause stress, which can affect your interest in sex. It's important to talk to your partner about how you feel.  Remember - the changes to your sex life don't usually last long. There's usually no medical reason to stop having sex during chemo. The drugs won't have any long term physical effects on your performance or enjoyment of sex. Cancer can't be passed on to your partner during sex  Contraception It's important to use reliable contraception during treatment. Avoid getting pregnant while you or your partner are having chemotherapy. This is because the drugs may harm the baby. Sometimes chemotherapy drugs can leave a man or woman infertile.  This means you would not be able to have children in the future. You might want to talk to someone about permanent infertility. It can be very difficult to learn that  you may no longer be able to have children. Some people find counselling helpful. There might be ways to preserve your fertility, although this is easier for men than for women. You may want to speak to a fertility expert. You can talk about sperm banking or harvesting your eggs. You can also ask about other fertility options, such as donor eggs. If you have or have had breast cancer, your doctor might advise you not to take the contraceptive pill. This is because the hormones in it  might affect the cancer. It is not known for sure whether or not chemotherapy drugs can be passed on through semen or secretions from the vagina. Because of this some doctors advise people to use a barrier method if you have sex during treatment. This applies to vaginal, anal or oral sex. Generally, doctors advise a barrier method only for the time you are actually having the treatment and for about a week after your treatment. Advice like this can be worrying, but this does not mean that you have to avoid being intimate with your partner. You can still have close contact with your partner and continue to enjoy sex.  Animals If you have cats or birds we just ask that you not change the litter or change the cage.  Please have someone else do this for you while you are on chemotherapy.   Food Safety During and After Cancer Treatment Food safety is important for people both during and after cancer treatment. Cancer and cancer treatments, such as chemotherapy, radiation therapy, and stem cell/bone marrow transplantation, often weaken the immune system. This makes it harder for your body to protect itself from foodborne illness, also called food poisoning. Foodborne illness is caused by eating food that contains harmful bacteria, parasites, or viruses.  Foods to avoid Some foods have a higher risk of becoming tainted with bacteria. These include: Unwashed fresh fruit and vegetables, especially leafy vegetables that can hide dirt and  other contaminants Raw sprouts, such as alfalfa sprouts Raw or undercooked beef, especially ground beef, or other raw or undercooked meat and poultry Fatty, fried, or spicy foods immediately before or after treatment.  These can sit heavy on your stomach and make you feel nauseous. Raw or undercooked shellfish, such as oysters. Sushi and sashimi, which often contain raw fish.  Unpasteurized beverages, such as unpasteurized fruit juices, raw milk, raw yogurt, or cider Undercooked eggs, such as soft boiled, over easy, and poached; raw, unpasteurized eggs; or foods made with raw egg, such as homemade raw cookie dough and homemade mayonnaise  Simple steps for food safety  Shop smart. Do not buy food stored or displayed in an unclean area. Do not buy bruised or damaged fruits or vegetables. Do not buy cans that have cracks, dents, or bulges. Pick up foods that can spoil at the end of your shopping trip and store them in a cooler on the way home.  Prepare and clean up foods carefully. Rinse all fresh fruits and vegetables under running water, and dry them with a clean towel or paper towel. Clean the top of cans before opening them. After preparing food, wash your hands for 20 seconds with hot water and soap. Pay special attention to areas between fingers and under nails. Clean your utensils and dishes with hot water and soap. Disinfect your kitchen and cutting boards using 1 teaspoon of liquid, unscented bleach mixed into 1 quart of water.    Dispose of old food. Eat canned and packaged food before its expiration date (the "use by" or "best before" date). Consume refrigerated leftovers within 3 to 4 days. After that time, throw out the food. Even if the food does not smell or look spoiled, it still may be unsafe. Some bacteria, such as Listeria, can grow even on foods stored in the refrigerator if they are kept for too long.  Take precautions when eating out. At restaurants, avoid buffets and  salad bars where food sits out for a long time and comes in contact  with many people. Food can become contaminated when someone with a virus, often a norovirus, or another "bug" handles it. Put any leftover food in a "to-go" container yourself, rather than having the server do it. And, refrigerate leftovers as soon as you get home. Choose restaurants that are clean and that are willing to prepare your food as you order it cooked.   AT HOME MEDICATIONS:                                                                                                                                                                Compazine /Prochlorperazine  10mg  tablet. Take 1 tablet every 6 hours as needed for nausea/vomiting. (This can make you sleepy)   EMLA  cream. Apply a quarter size amount to port site 1 hour prior to chemo. Do not rub in. Cover with plastic wrap.    Diarrhea Sheet   If you are having loose stools/diarrhea, please purchase Imodium and begin taking as outlined:  At the first sign of poorly formed or loose stools you should begin taking Imodium (loperamide) 2 mg capsules.  Take two tablets (4mg ) followed by one tablet (2mg ) every 2 hours - DO NOT EXCEED 8 tablets in 24 hours.  If it is bedtime and you are having loose stools, take 2 tablets at bedtime, then 2 tablets every 4 hours until morning.   Always call the Cancer Center if you are having loose stools/diarrhea that you can't get under control.  Loose stools/diarrhea leads to dehydration (loss of water) in your body.  We have other options of trying to get the loose stools/diarrhea to stop but you must let us  know!   Constipation Sheet  Colace - 100 mg capsules - take 2 capsules daily.  If this doesn't help then you can increase to 2 capsules twice daily.  Please call if the above does not work for you. Do not go more than 2 days without a bowel movement.  It is very important that you do not become constipated.  It will make you feel sick  to your stomach (nausea) and can cause abdominal pain and vomiting.  Nausea Sheet   Compazine /Prochlorperazine  10mg  tablet. Take 1 tablet every 6 hours as needed for nausea/vomiting (This can make you drowsy).  If you are having persistent nausea (nausea that does not stop) please call the Cancer Center and let us  know the amount of nausea that you are experiencing.  If you begin to vomit, you need to call the Cancer Center and if it is the weekend and you have vomited more than one time and can't get it to stop-go to the Emergency Room.  Persistent nausea/vomiting can lead to dehydration (loss of fluid in your body) and will make you feel very weak and unwell.  Ice chips, sips of clear liquids, foods that are at room temperature, crackers, and toast tend to be better tolerated.   SYMPTOMS TO REPORT AS SOON AS POSSIBLE AFTER TREATMENT:  FEVER GREATER THAN 100.4 F  CHILLS WITH OR WITHOUT FEVER  NAUSEA AND VOMITING THAT IS NOT CONTROLLED WITH YOUR NAUSEA MEDICATION  UNUSUAL SHORTNESS OF BREATH  UNUSUAL BRUISING OR BLEEDING  TENDERNESS IN MOUTH AND THROAT WITH OR WITHOUT PRESENCE OF ULCERS  URINARY PROBLEMS  BOWEL PROBLEMS  UNUSUAL RASH      Wear comfortable clothing and clothing appropriate for easy access to any Portacath or PICC line. Let us  know if there is anything that we can do to make your therapy better!    What to do if you need assistance after hours or on the weekends: CALL [phone].  HOLD on the line, do not hang up.  You will hear multiple messages but at the end you will be connected with a nurse triage line.  They will contact the doctor if necessary.  Most of the time they will be able to assist you.  Do not call the hospital operator.      I have been informed and understand all of the instructions given to me and have received a copy. I have been instructed to call the clinic [phone] or my family physician as soon as possible for continued medical  care, if indicated. I do not have any more questions at this time but understand that I may call the Cancer Center or the Patient Navigator at [phone] during office hours should I have questions or need assistance in obtaining follow-up care.

## 2023-11-02 NOTE — Progress Notes (Signed)
 Patient presents today for C1D1 Paclitaxel /Carboplatin  infusion. Patient is in satisfactory condition with no new complaints voiced.  Vital signs are stable.  Labs reviewed by Johnston Police, PA-C during the office visit and all labs are within treatment parameters.  We will proceed with treatment per MD orders.   Treatment given today per MD orders. Tolerated infusion without adverse affects. Vital signs stable. No complaints at this time. Discharged from clinic ambulatory in stable condition. Alert and oriented x 3. F/U with [facility] as scheduled.

## 2023-11-02 NOTE — Progress Notes (Signed)
 Discontinue diphenhydramine  from oncology treatment plan --> Add Quzyttir  (cetirizine ) 10 mg IVPush x 1 as premedication for oncology treatment plan.  T.O. Dr Ivery Molt, PharmD
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
## 2023-11-02 NOTE — Progress Notes (Signed)
 Consent and treatment information given to the patient.  Questions asked and answered.

## 2023-11-03 ENCOUNTER — Ambulatory Visit: Payer: Self-pay | Admitting: *Deleted

## 2023-11-03 ENCOUNTER — Encounter: Payer: Self-pay | Admitting: Oncology

## 2023-11-03 ENCOUNTER — Other Ambulatory Visit: Payer: Self-pay | Admitting: *Deleted

## 2023-11-03 ENCOUNTER — Telehealth: Payer: Self-pay

## 2023-11-03 ENCOUNTER — Telehealth: Payer: Self-pay | Admitting: *Deleted

## 2023-11-03 NOTE — Telephone Encounter (Unsigned)
 Copied from CRM [id number]. Topic: Clinical - Lab/Test Results >> Nov 03, 2023  8:56 AM Samuel Hubbard wrote: Reason for CRM: Cancer center sent a fax about lab results of patient, and the fax notes states due to tube feeding patient has not been taking synthroids.  To clarify patient has been crushing it up and taking it with the other medications, Patient was told yesterday to crush and take by mouth.

## 2023-11-03 NOTE — Telephone Encounter (Signed)
 Per Dr. Davonna, patient may stop lovenox  and restart Eliquis , which can be crushed and taken by tube.  Verbalized understanding.

## 2023-11-03 NOTE — Progress Notes (Signed)
 Reached out to patient and he has not been taking synthroid  due to tube feeds.  Was educated by Elvie RD yesterday.  Results sent to PCP and patient will reach out to her today after radiation treatment to discuss.

## 2023-11-03 NOTE — Progress Notes (Signed)
 Patient called back and stated that he was crushing and taking along with other medications.  States he was advised to crush and take by mouth by itself.  Patient will still reach out to PCP this afternoon.

## 2023-11-04 ENCOUNTER — Other Ambulatory Visit: Payer: Self-pay | Admitting: *Deleted

## 2023-11-04 DIAGNOSIS — E038 Other specified hypothyroidism: Secondary | ICD-10-CM

## 2023-11-04 NOTE — Progress Notes (Signed)
 24 hour call back: 15:02 pm. C1D1 Taxol , Carboplatin  on 11/02/2023. Patient denies nausea, vomiting, diarrhea. Patient states he is able to drink fluids but appetite has decreased so patient is unable to eat as well. Patient states,  NO appetite. Concurrent chemoradiation with carbo / taxol . Patient instructed to call the clinic if symptoms get worse and understanding verbalized.

## 2023-11-04 NOTE — Telephone Encounter (Signed)
 Patient demanding to be seen this week- offered him appointment on Friday at 9:00 am with Leita he said he is unable to make that. Per Tobie if he can't make that he will have to get in early next week as we do not have any more availability this week. LVM to inform patient.

## 2023-11-04 NOTE — Telephone Encounter (Signed)
 LVM with patient to call back per Tobie to provide patient with appointment with him or Leita within the next week. Will continue to reach out to patient

## 2023-11-04 NOTE — Telephone Encounter (Signed)
 Patient is returning call    Transferred to the office

## 2023-11-06 ENCOUNTER — Encounter (HOSPITAL_COMMUNITY): Payer: Self-pay | Admitting: Gastroenterology

## 2023-11-06 ENCOUNTER — Other Ambulatory Visit: Payer: Self-pay

## 2023-11-06 ENCOUNTER — Other Ambulatory Visit: Payer: Self-pay | Admitting: Oncology

## 2023-11-06 MED ORDER — OXYCODONE HCL 5 MG/5ML PO SOLN: 5.0000 mg | Freq: Four times a day (QID) | ORAL | 0 refills | Status: DC | PRN

## 2023-11-06 MED ORDER — OXYCODONE HCL 5 MG/5ML PO SOLN: 5.0000 mg | Freq: Four times a day (QID) | ORAL | 0 refills | Status: AC | PRN

## 2023-11-09 ENCOUNTER — Inpatient Hospital Stay

## 2023-11-09 ENCOUNTER — Inpatient Hospital Stay: Admitting: Dietician

## 2023-11-09 ENCOUNTER — Inpatient Hospital Stay (HOSPITAL_BASED_OUTPATIENT_CLINIC_OR_DEPARTMENT_OTHER): Admitting: Physician Assistant

## 2023-11-09 VITALS — BP 139/76 | HR 75 | Temp 96.6°F | Resp 18

## 2023-11-09 DIAGNOSIS — C155 Malignant neoplasm of lower third of esophagus: Secondary | ICD-10-CM

## 2023-11-09 DIAGNOSIS — Z5111 Encounter for antineoplastic chemotherapy: Secondary | ICD-10-CM | POA: Diagnosis not present

## 2023-11-09 LAB — CBC WITH DIFFERENTIAL/PLATELET
Abs Immature Granulocytes: 0.08 K/uL — ABNORMAL HIGH (ref 0.00–0.07)
Basophils Absolute: 0 K/uL (ref 0.0–0.1)
Basophils Relative: 1 %
Eosinophils Absolute: 0.1 K/uL (ref 0.0–0.5)
Eosinophils Relative: 1 %
HCT: 32.8 % — ABNORMAL LOW (ref 39.0–52.0)
Hemoglobin: 10.5 g/dL — ABNORMAL LOW (ref 13.0–17.0)
Immature Granulocytes: 1 %
Lymphocytes Relative: 9 %
Lymphs Abs: 0.6 K/uL — ABNORMAL LOW (ref 0.7–4.0)
MCH: 30 pg (ref 26.0–34.0)
MCHC: 32 g/dL (ref 30.0–36.0)
MCV: 93.7 fL (ref 80.0–100.0)
Monocytes Absolute: 0.4 K/uL (ref 0.1–1.0)
Monocytes Relative: 6 %
Neutro Abs: 5.1 K/uL (ref 1.7–7.7)
Neutrophils Relative %: 82 %
Platelets: 265 K/uL (ref 150–400)
RBC: 3.5 MIL/uL — ABNORMAL LOW (ref 4.22–5.81)
RDW: 13.2 % (ref 11.5–15.5)
WBC: 6.3 K/uL (ref 4.0–10.5)
nRBC: 0 % (ref 0.0–0.2)

## 2023-11-09 LAB — COMPREHENSIVE METABOLIC PANEL WITH GFR
ALT: 15 U/L (ref 0–44)
AST: 11 U/L — ABNORMAL LOW (ref 15–41)
Albumin: 3 g/dL — ABNORMAL LOW (ref 3.5–5.0)
Alkaline Phosphatase: 68 U/L (ref 38–126)
Anion gap: 8 (ref 5–15)
BUN: 23 mg/dL (ref 8–23)
CO2: 28 mmol/L (ref 22–32)
Calcium: 8.7 mg/dL — ABNORMAL LOW (ref 8.9–10.3)
Chloride: 99 mmol/L (ref 98–111)
Creatinine, Ser: 0.5 mg/dL — ABNORMAL LOW (ref 0.61–1.24)
GFR, Estimated: >60 mL/min (ref >60)
Glucose, Bld: 142 mg/dL — ABNORMAL HIGH (ref 70–99)
Potassium: 4.3 mmol/L (ref 3.5–5.1)
Sodium: 135 mmol/L (ref 135–145)
Total Bilirubin: 0.3 mg/dL (ref 0.0–1.2)
Total Protein: 6.4 g/dL — ABNORMAL LOW (ref 6.5–8.1)

## 2023-11-09 LAB — MAGNESIUM: Magnesium: 1.6 mg/dL — ABNORMAL LOW (ref 1.7–2.4)

## 2023-11-09 MED ORDER — PALONOSETRON HCL INJECTION 0.25 MG/5ML
0.2500 mg | Freq: Once | INTRAVENOUS | Status: AC
  Administered 2023-11-09: 0.25 mg via INTRAVENOUS
  Filled 2023-11-09: qty 5

## 2023-11-09 MED ORDER — SODIUM CHLORIDE 0.9 % IV SOLN
260.0000 mg | Freq: Once | INTRAVENOUS | Status: AC
  Administered 2023-11-09: 260 mg via INTRAVENOUS
  Filled 2023-11-09: qty 26

## 2023-11-09 MED ORDER — FAMOTIDINE IN NACL 20-0.9 MG/50ML-% IV SOLN
20.0000 mg | Freq: Once | INTRAVENOUS | Status: AC
  Administered 2023-11-09: 20 mg via INTRAVENOUS
  Filled 2023-11-09: qty 50

## 2023-11-09 MED ORDER — SODIUM CHLORIDE 0.9 % IV SOLN: INTRAVENOUS | Status: DC

## 2023-11-09 MED ORDER — SODIUM CHLORIDE 0.9 % IV SOLN
50.0000 mg/m2 | Freq: Once | INTRAVENOUS | Status: AC
  Administered 2023-11-09: 108 mg via INTRAVENOUS
  Filled 2023-11-09: qty 18

## 2023-11-09 MED ORDER — CETIRIZINE HCL 10 MG/ML IV SOLN
10.0000 mg | Freq: Once | INTRAVENOUS | Status: AC
  Administered 2023-11-09: 10 mg via INTRAVENOUS
  Filled 2023-11-09: qty 1

## 2023-11-09 MED ORDER — MAGNESIUM SULFATE 2 GM/50ML IV SOLN
2.0000 g | Freq: Once | INTRAVENOUS | Status: AC
Start: 2023-11-09 — End: 2023-11-09
  Administered 2023-11-09: 2 g via INTRAVENOUS
  Filled 2023-11-09: qty 50

## 2023-11-09 MED ORDER — DEXAMETHASONE SODIUM PHOSPHATE 10 MG/ML IJ SOLN
10.0000 mg | Freq: Once | INTRAMUSCULAR | Status: AC
  Administered 2023-11-09: 10 mg via INTRAVENOUS
  Filled 2023-11-09: qty 1

## 2023-11-09 NOTE — Progress Notes (Signed)
 Patient presents today for Paclitaxel /Carboplatin   infusion. Patient is in satisfactory condition with no new complaints voiced.  Vital signs are stable.  Labs reviewed by Johnston Rosebush  during the office visit and all labs are within treatment parameters. Patient will receive 2g IV magnesium  per standing orders.  We will proceed with treatment per MD orders.   Treatment given today per MD orders. Tolerated infusion without adverse affects. Vital signs stable. No complaints at this time. Discharged from clinic ambulatory in stable condition. Alert and oriented x 3. F/U with [facility] as scheduled.
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
## 2023-11-09 NOTE — Progress Notes (Signed)
 Nutrition Follow-up:  Patient with malignant neoplasm of lower third esophagus. He has completed neoadjuvant FLOT q14d x 8 cycles. Esophagectomy at [facility] (8/12) aborted due to concerns for gastric tumor. S/p Jtube. Pathology negative for cancer. Patient is now receiving concurrent chemoradiation with weekly carboplatin /paclitaxel  (start 9/15).   7/22 - EGD with successful removal of food and pill in lower third of esophagus (full liquid diet order per GI)   Met with patient and wife in infusion. He is feeling sleepy. In chronic pain secondary to knee. Pain is worse at night and sleep is poor. Constipation has resolved. He is now on bowel regimen (daily miralax per provider). Tolerated some po. Yesterday had mac and cheese as well as cookies soaked in milk. Patient drinking protein shake sometimes. Jtube feedings decreased due to recent constipation and bloating as advised by radiation team at [facility]. He is tolerating Nutren 1.5 @ 70 ml/hr x9-12h. This depends on what time he goes to bed. Patient prefers to try eating orally during the day.    Medications: reviewed   Labs: glucose 142, Cr 0.50, albumin 3.0, Mg 1.6  Anthropometrics: Wt 212 lb 12.8 oz today   9/15 - 212 lb 6.4 oz 8/28 - 209 lb 10.5 oz 8/4 - 207 lb 3.7 oz   NUTRITION DIAGNOSIS: Food and nutrition related knowledge deficit improving    INTERVENTION:  Continue oral intake as tolerated - choose high calorie high protein foods  Recommend daily protein shake - if full, suggest splitting over 2 servings Continue overnight feeds - pt will increase by 5 ml/day as tolerated. Goal rate 130 ml/hr x 12 hours  Continue bowel regimen per provider     MONITORING, EVALUATION, GOAL: wt trends, intake, TF    NEXT VISIT: Monday September 29 during infusion

## 2023-11-09 NOTE — Patient Instructions (Signed)
 [facility] - A DEPT OF [facility]  Discharge Instructions: Thank you for choosing [facility] to provide your oncology and hematology care.  If you have a lab appointment with the Cancer Center - please note that after April 8th, 2024, all labs will be drawn in the cancer center.  You do not have to check in or register with the main entrance as you have in the past but will complete your check-in in the cancer center.  Wear comfortable clothing and clothing appropriate for easy access to any Portacath or PICC line.   We strive to give you quality time with your provider. You may need to reschedule your appointment if you arrive late (15 or more minutes).  Arriving late affects you and other patients whose appointments are after yours.  Also, if you miss three or more appointments without notifying the office, you may be dismissed from the clinic at the provider's discretion.      For prescription refill requests, have your pharmacy contact our office and allow 72 hours for refills to be completed.    Today you received the following chemotherapy and/or immunotherapy agents Paclitaxel /Carboplatin / 2g IV magnesium  sulfate      To help prevent nausea and vomiting after your treatment, we encourage you to take your nausea medication as directed.  BELOW ARE SYMPTOMS THAT SHOULD BE REPORTED IMMEDIATELY: *FEVER GREATER THAN 100.4 F (38 C) OR HIGHER *CHILLS OR SWEATING *NAUSEA AND VOMITING THAT IS NOT CONTROLLED WITH YOUR NAUSEA MEDICATION *UNUSUAL SHORTNESS OF BREATH *UNUSUAL BRUISING OR BLEEDING *URINARY PROBLEMS (pain or burning when urinating, or frequent urination) *BOWEL PROBLEMS (unusual diarrhea, constipation, pain near the anus) TENDERNESS IN MOUTH AND THROAT WITH OR WITHOUT PRESENCE OF ULCERS (sore throat, sores in mouth, or a toothache) UNUSUAL RASH, SWELLING OR PAIN  UNUSUAL VAGINAL DISCHARGE OR ITCHING   Items with * indicate a  potential emergency and should be followed up as soon as possible or go to the Emergency Department if any problems should occur.  Please show the CHEMOTHERAPY ALERT CARD or IMMUNOTHERAPY ALERT CARD at check-in to the Emergency Department and triage nurse.  Should you have questions after your visit or need to cancel or reschedule your appointment, please contact [facility]  - A DEPT OF [facility] [phone]  and follow the prompts.  Office hours are 8:00 a.m. to 4:30 p.m. Monday - Friday. Please note that voicemails left after 4:00 p.m. may not be returned until the following business day.  We are closed weekends and major holidays. You have access to a nurse at all times for urgent questions. Please call the main number to the clinic [phone] and follow the prompts.  For any non-urgent questions, you may also contact your provider using MyChart. We now offer e-Visits for anyone 27 and older to request care online for non-urgent symptoms. For details visit [email or web address].   Also download the MyChart app! Go to the app store, search MyChart, open the app, select [facility], and log in with your MyChart username and password.

## 2023-11-10 ENCOUNTER — Other Ambulatory Visit (HOSPITAL_COMMUNITY)

## 2023-11-10 ENCOUNTER — Inpatient Hospital Stay

## 2023-11-10 ENCOUNTER — Other Ambulatory Visit: Payer: Self-pay | Admitting: Oncology

## 2023-11-15 NOTE — Progress Notes (Incomplete)
 Patient Care Team: System, Provider Not In as PCP - General Mallipeddi, [name], MD as PCP - Cardiology (Cardiology) [name], MD as Medical Oncologist (Medical Oncology) [name], RN as Oncology Nurse Navigator (Medical Oncology) Dr Willma [facility], OD (Optometry) [name], [name], MD as Consulting Physician (Gastroenterology)  Clinic Day:  10/15/2023  Referring physician: No ref. provider found   CHIEF COMPLAINT:  CC: Stage III (T3 N0 M0 G3) poorly differentiated adenocarcinoma of distal esophagus   ASSESSMENT & PLAN:   Assessment & Plan: Samuel Hubbard  is a 69 y.o. male with stage III (T3 N0 M0 G3) poorly differentiated adenocarcinoma of distal esophagus  Assessment & Plan Malignant neoplasm of lower third of esophagus (HCC)  Esophageal dysphagia  Other polyneuropathy  Persistent atrial fibrillation (HCC)     The patient understands the plans discussed today and is in agreement with them.  He knows to contact our office if he develops concerns prior to his next appointment.  *** minutes of total time was spent for this patient encounter, including preparation, face-to-face counseling with the patient and coordination of care, physical exam, and documentation of the encounter. > 50% of the time was spent on counseling as documented under my assessment and plan.    LILLETTE Verneta SAUNDERS [name],acting as a Neurosurgeon for Siad Davonna, MD.,have documented all relevant documentation on the behalf of Siad Davonna, MD,as directed by  Siad Davonna, MD while in the presence of Siad Davonna, MD.  ***   Verneta SAUNDERS Ege  [facility] [place] - A DEPT OF [facility] [address] [place] [address] Dept: [phone] Dept Fax: [phone]   No orders of the defined types were placed in this encounter.    ONCOLOGY HISTORY:   I have reviewed his chart and materials related to his  cancer extensively and collaborated history with the patient. 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  Pathology: Poorly differentiated adenocarcinoma with mucinous and signet ring cell features. -01/05/2023: CT CAP:  Substantial irregular wall thickening of the distal half of the thoracic esophagus extending over a 8.5 cm length, compatible with tumor. No findings of metastatic disease in the chest, abdomen, or pelvis. -01/22/2023: Initial PET: Circumferential wall thickening in the distal third of the thoracic esophagus, with maximum SUV 5.7, compatible with malignancy. -01/29/2023: Germline mutation testing: Negative with the VUS of APC and RET -02/02/2023: EGD/EUS: Large fungating and ulcerating mass in the distal esophagus, at the GE junction extending into the cardia, 32 to 42 cm from the incisors. Mass was partially obstructing and circumferential. Sonographic evidence suggesting invasion into the muscularis propria and adventitia. 1 enlarged lymph node in the lower paraesophageal mediastinum measuring 4 x 3 mm, which was round, hypoechoic and had well-defined margins. FNB not performed.  -[facility] NGS testing: HER2 (0), MS-stable, TMB-low, CLDN 18-negative -02/16/2023-04/09/2023: 4 cycles of neoadjuvant FLOT (ESOPEC trial) -04/23/2023: Patient evaluated by Dr. Medora rodes surgeon], who recommended esophagectomy. Patient was reluctant to undergo surgery, so proceeded with EGD with dilation and biopsy.  -05/11/2023: EGD: Severe erosion in the distal esophagus consistent with the location of the tumor. The esophagus is significantly narrowed. Biopsies taken at 36, 38, 40 cm.  -  Pathology ar 36 cm and 38 cm: Atypical cells present suspicious for  adenocarcinoma.  -Pathology at 40 cm: Poorly differentiated invasive mucinous adenocarcinoma with patchy signet ring cells.  -06/04/2023: Patient seen by Dr. Sheilda coons oncologist] at [facility] who recommended 4 additional cycles of FLOT and surgical reevaluation. -06/15/2023 - 07/27/2023: 4 more cycles of FLOT completed -08/27/2023: PET: Mild thickening of distal esophagus with mild hypermetabolic activity. No evidence of hypermetabolic disease in the chest, abdomen or pelvis.  -09/29/2023: CT CAP: No measurable peritoneal or omental disease. Small volume trace free fluid versus ill-defined omental/peritoneal implants as above. Diffuse wall thickening of the gastroesophageal junction, compatible with malignancy.  Bilateral lower lung predominant consolidation and groundglass, suspicious for aspiration and atelectasis, infection could have a similar appearance. Trace bilateral pleural effusions.  -09/29/2023: Attempted esophagectomy with Dr.Saltify at [facility]  but aborted due evidence of bulky tumor at [facility], splenic, and omental metastases and metastases localized to gastric fundus and body limiting width of potential conduit. A j-tube was placed. Omental nodules were biopsied -09/29/2023: Right omentum nodule biopsy.  -Pathology of right omentum nodule 2: Mesothelial-lined fibroadipose tissue and smooth muscle with no pathologic change. Negative for malignancy. -Pathology of right omental nodule 1: Fibroadipose tissue with necrosis. Negative for malignancy.  Current Treatment:  Planned chemo RT with carboplatin  and paclitaxel   INTERVAL HISTORY:   Samuel Hubbard is here today for follow up. Patient is accompanied by his wife today.     I have reviewed the past medical history, past surgical history, social history and family history with the patient and they are unchanged from previous note.  ALLERGIES:  is allergic to other, wasp venom, and zolpidem.  MEDICATIONS:  Current Outpatient Medications   Medication Sig Dispense Refill   apixaban  (ELIQUIS ) 5 MG TABS tablet Take 5 mg by mouth 2 (two) times daily.     atorvastatin  (LIPITOR) 40 MG tablet TAKE 1 TABLET BY MOUTH DAILY 90 tablet 0   blood glucose meter kit and supplies Dispense based on patient and insurance preference. Once daily testing DX E11.9 1 each 0   citalopram  (CELEXA ) 20 MG tablet TAKE 1 TABLET BY MOUTH EVERY DAY 90 tablet 3   dexamethasone  (DECADRON ) 4 MG tablet Take 2 tablets daily for 2 days, start the day after chemotherapy. Take with food. 30 tablet 1   gabapentin  (NEURONTIN ) 300 MG capsule Take 1 capsule (300 mg total) by mouth at bedtime. 30 capsule 1   glucose blood test strip Use as instructed 100 each 12   Lancets 30G MISC Once daily testing dx e11.9 100 each 5   levothyroxine  (SYNTHROID ) 112 MCG tablet TAKE 2 TABLETS BY MOUTH PRIOR TO BREAKFAST 180 tablet 0   lidocaine -prilocaine  (EMLA ) cream Apply to affected area once 30 g 3   metFORMIN  (GLUCOPHAGE ) 500 MG tablet Take 2 tablets (1,000 mg total) by mouth 2 (two) times daily with a meal. 120 tablet 3   metoprolol  tartrate (LOPRESSOR ) 25 MG tablet Take 25 mg by mouth 2 (two) times daily.     nitroGLYCERIN  (NITROSTAT ) 0.4 MG SL tablet Place 1 tablet (0.4 mg total) under the tongue every 5 (five) minutes as needed for chest pain. 50 tablet 3   olmesartan  (BENICAR ) 40 MG tablet TAKE 1 TABLET BY MOUTH EVERY DAY 90 tablet 1   omeprazole  (PRILOSEC) 40 MG capsule Take 1 capsule (40 mg total) by mouth 2 (two) times daily. Swallow granules 180 capsule 3   oxyCODONE  (ROXICODONE ) 5 MG/5ML solution Take 5 mLs (5  mg total) by mouth every 6 (six) hours as needed for severe pain (pain score 7-10). 600 mL 0   oxyCODONE  (ROXICODONE ) 5 MG/5ML solution Place 5 mLs (5 mg total) into feeding tube every 6 (six) hours as needed for severe pain (pain score 7-10). 100 mL 0   prochlorperazine  (COMPAZINE ) 10 MG tablet Take 1 tablet (10 mg total) by mouth every 6 (six) hours as needed for  nausea or vomiting. 30 tablet 1   sucralfate  (CARAFATE ) 1 g tablet 1 g by Enteral route.     TYLENOL  CHILDRENS 160 MG/5ML suspension TAKE 20.3 ML BY MOUTH EVERY 6 HOURS AS NEEDED     No current facility-administered medications for this visit.    REVIEW OF SYSTEMS:   Constitutional: Denies fevers, chills or abnormal weight loss Eyes: Denies blurriness of vision Ears, nose, mouth, throat, and face: Denies mucositis or sore throat Respiratory: Denies cough, dyspnea or wheezes Cardiovascular: Denies palpitation, chest discomfort or lower extremity swelling Gastrointestinal:  Denies nausea, heartburn or change in bowel habits Skin: Denies abnormal skin rashes Lymphatics: Denies new lymphadenopathy or easy bruising Neurological:Denies numbness, tingling or new weaknesses Behavioral/Psych: Mood is stable, no new changes  All other systems were reviewed with the patient and are negative.   VITALS:  There were no vitals taken for this visit.  Wt Readings from Last 3 Encounters:  11/09/23 212 lb 12.8 oz (96.5 kg)  11/02/23 212 lb 6.4 oz (96.3 kg)  10/15/23 209 lb 10.5 oz (95.1 kg)    There is no height or weight on file to calculate BMI.  Performance status (ECOG): 1 - Symptomatic but completely ambulatory  PHYSICAL EXAM:   GENERAL:alert, no distress and comfortable SKIN: skin color, texture, turgor are normal, no rashes or significant lesions, port site clean LYMPH:  no palpable lymphadenopathy in the cervical, axillary or inguinal LUNGS: clear to auscultation and percussion with normal breathing effort HEART: regular rate & rhythm and no murmurs and no lower extremity edema ABDOMEN:abdomen soft, non-tender and normal bowel sounds, PEG tube site clean Musculoskeletal:no cyanosis of digits and no clubbing  NEURO: alert & oriented x 3 with fluent speech, no focal motor/sensory deficits  LABORATORY DATA:  I have reviewed the data as listed  Lab Results  Component Value Date    WBC 6.3 11/09/2023   NEUTROABS 5.1 11/09/2023   HGB 10.5 (L) 11/09/2023   HCT 32.8 (L) 11/09/2023   MCV 93.7 11/09/2023   PLT 265 11/09/2023     Chemistry      Component Value Date/Time   NA 135 11/09/2023 0844   NA 140 12/01/2022 1513   K 4.3 11/09/2023 0844   CL 99 11/09/2023 0844   CO2 28 11/09/2023 0844   BUN 23 11/09/2023 0844   BUN 17 12/01/2022 1513   CREATININE 0.50 (L) 11/09/2023 0844   CREATININE 1.10 10/13/2018 1054      Component Value Date/Time   CALCIUM  8.7 (L) 11/09/2023 0844   ALKPHOS 68 11/09/2023 0844   AST 11 (L) 11/09/2023 0844   ALT 15 11/09/2023 0844   BILITOT 0.3 11/09/2023 0844   BILITOT 0.3 12/01/2022 1513      Latest Reference Range & Units 09/08/23 16:04  TSH 0.350 - 4.500 uIU/mL 11.650 (H)  (H): Data is abnormally high  RADIOGRAPHIC STUDIES: I have personally reviewed the radiological images as listed and agreed with the findings in the report.  CT Soft Tissue Neck W Contrast CLINICAL DATA:  Head/neck cancer, monitor difficulty swallowing  EXAM: CT NECK WITH CONTRAST  TECHNIQUE: Multidetector CT imaging of the neck was performed using the standard protocol following the bolus administration of intravenous contrast.  RADIATION DOSE REDUCTION: This exam was performed according to the departmental dose-optimization program which includes automated exposure control, adjustment of the mA and/or kV according to patient size and/or use of iterative reconstruction technique.  CONTRAST:  75mL OMNIPAQUE  IOHEXOL  300 MG/ML  SOLN  COMPARISON:  PET-CT fusion study dated April 13, 2023.  FINDINGS: Pharynx and larynx: Normal. No mass or swelling.  Salivary glands: No inflammation, mass, or stone.  Thyroid : Status post total thyroidectomy. No masses are present within the thyroid  fossa. There are several surgical clips.  Lymph nodes: There are few shotty cervical lymph nodes present bilaterally. There are no pathologically enlarged lymph  nodes present.  Vascular: Negative.  Limited intracranial: Unremarkable.  Visualized orbits: Negative.  Mastoids and visualized paranasal sinuses: Negative.  Skeleton: Mild degenerative disc disease.  No osseous lesions.  Upper chest: The visualized lungs are clear. A right internal jugular chest port is present.  Other: The thoracic esophagus is distended with air and fluid.  IMPRESSION: 1. The visualized thoracic esophagus is distended with air in fluid, suggesting reflux or distal stricture or obstruction. 2. Status post thyroidectomy.  Electronically Signed   By: Evalene Coho M.D.   On: 09/08/2023 18:03 DG Chest Port 1 View CLINICAL DATA:  Cough. Difficulty swallowing starting yesterday. Recent chemotherapy for esophageal cancer.  EXAM: PORTABLE CHEST 1 VIEW  COMPARISON:  PET-CT 04/13/2023  FINDINGS: Power injectable right Port-A-Cath tip: SVC.  The lungs appear clear. Cardiac and mediastinal contours normal. No blunting of the costophrenic angles.  IMPRESSION: 1. No active cardiopulmonary disease is radiographically apparent. 2. Power injectable right Port-A-Cath tip: SVC.  Electronically Signed   By: Ryan Salvage M.D.   On: 09/08/2023 15:33

## 2023-11-16 ENCOUNTER — Inpatient Hospital Stay: Admitting: Dietician

## 2023-11-16 ENCOUNTER — Telehealth: Payer: Self-pay | Admitting: Dietician

## 2023-11-16 ENCOUNTER — Inpatient Hospital Stay

## 2023-11-16 ENCOUNTER — Inpatient Hospital Stay: Admitting: Oncology

## 2023-11-16 DIAGNOSIS — C155 Malignant neoplasm of lower third of esophagus: Secondary | ICD-10-CM

## 2023-11-16 DIAGNOSIS — R1319 Other dysphagia: Secondary | ICD-10-CM

## 2023-11-16 DIAGNOSIS — G6289 Other specified polyneuropathies: Secondary | ICD-10-CM

## 2023-11-16 DIAGNOSIS — I4819 Other persistent atrial fibrillation: Secondary | ICD-10-CM

## 2023-11-16 NOTE — Telephone Encounter (Signed)
 Nutrition Follow-up:  Brief nutrition follow-up with wife of patient via telephone.   Patient seen in ED [facility]) this morning due to weakness, hypotension, irregular heart beat.  Wife reports patient awaiting discharge papers. BP stable s/p fluids. Imaging without acute findings.   Wife reports patient experienced bloating after eating some shrimp last night. Jtube feedings running at 85 ml/hr X12 hours the last 4 days. Last BM yesterday. He is taking half capful of miralax daily. Wife states ED provider recommended reducing tube feeds to 65 ml/hr x 24 hours. Reports radiation oncologist (Dr. [name]) instructed no solids by mouth. May have sips of water as tolerated. Patient denies dysphagia.   Medications: reviewed   Labs: 9/29 [facility]) BUN 22, albumin 2.0  Anthropometrics: No new wts   9/22 - 212 lb 12.8 oz    NUTRITION DIAGNOSIS: Food and nutrition related knowledge deficit improving    INTERVENTION:  Pt tolerating Jtube feedings - Nutren 1.5 @ 85 ml/hr x 12h last days - do not feel bloating related to feedings. Recommend increasing miralax to 1 capful daily. Will trial x 3 days and make adjustments as indicated Encouraged to clarify diet order with Dr. [name] Support and encouragement     MONITORING, EVALUATION, GOAL: wt trends, intake   NEXT VISIT: Monday October 6 in infusion

## 2023-11-17 ENCOUNTER — Ambulatory Visit: Admitting: Oncology

## 2023-11-20 ENCOUNTER — Encounter (INDEPENDENT_AMBULATORY_CARE_PROVIDER_SITE_OTHER): Payer: Self-pay | Admitting: *Deleted

## 2023-11-23 ENCOUNTER — Inpatient Hospital Stay: Attending: Hematology

## 2023-11-23 ENCOUNTER — Inpatient Hospital Stay

## 2023-11-23 ENCOUNTER — Inpatient Hospital Stay: Admitting: Oncology

## 2023-11-23 ENCOUNTER — Inpatient Hospital Stay: Admitting: Dietician

## 2023-11-23 VITALS — BP 130/81 | HR 70 | Temp 99.6°F | Resp 16

## 2023-11-23 DIAGNOSIS — C155 Malignant neoplasm of lower third of esophagus: Secondary | ICD-10-CM | POA: Diagnosis not present

## 2023-11-23 DIAGNOSIS — Z79899 Other long term (current) drug therapy: Secondary | ICD-10-CM | POA: Diagnosis not present

## 2023-11-23 DIAGNOSIS — E890 Postprocedural hypothyroidism: Secondary | ICD-10-CM | POA: Diagnosis not present

## 2023-11-23 DIAGNOSIS — F329 Major depressive disorder, single episode, unspecified: Secondary | ICD-10-CM | POA: Insufficient documentation

## 2023-11-23 DIAGNOSIS — Z95828 Presence of other vascular implants and grafts: Secondary | ICD-10-CM

## 2023-11-23 DIAGNOSIS — Z5111 Encounter for antineoplastic chemotherapy: Secondary | ICD-10-CM | POA: Diagnosis present

## 2023-11-23 DIAGNOSIS — G6289 Other specified polyneuropathies: Secondary | ICD-10-CM | POA: Diagnosis not present

## 2023-11-23 DIAGNOSIS — G629 Polyneuropathy, unspecified: Secondary | ICD-10-CM | POA: Insufficient documentation

## 2023-11-23 DIAGNOSIS — F411 Generalized anxiety disorder: Secondary | ICD-10-CM | POA: Diagnosis not present

## 2023-11-23 DIAGNOSIS — R1319 Other dysphagia: Secondary | ICD-10-CM

## 2023-11-23 DIAGNOSIS — E039 Hypothyroidism, unspecified: Secondary | ICD-10-CM

## 2023-11-23 LAB — CBC WITH DIFFERENTIAL/PLATELET
Abs Immature Granulocytes: 0.01 K/uL (ref 0.00–0.07)
Basophils Absolute: 0 K/uL (ref 0.0–0.1)
Basophils Relative: 0 %
Eosinophils Absolute: 0 K/uL (ref 0.0–0.5)
Eosinophils Relative: 1 %
HCT: 34 % — ABNORMAL LOW (ref 39.0–52.0)
Hemoglobin: 11 g/dL — ABNORMAL LOW (ref 13.0–17.0)
Immature Granulocytes: 0 %
Lymphocytes Relative: 4 %
Lymphs Abs: 0.2 K/uL — ABNORMAL LOW (ref 0.7–4.0)
MCH: 29.5 pg (ref 26.0–34.0)
MCHC: 32.4 g/dL (ref 30.0–36.0)
MCV: 91.2 fL (ref 80.0–100.0)
Monocytes Absolute: 0.5 K/uL (ref 0.1–1.0)
Monocytes Relative: 12 %
Neutro Abs: 3.5 K/uL (ref 1.7–7.7)
Neutrophils Relative %: 83 %
Platelets: 199 K/uL (ref 150–400)
RBC: 3.73 MIL/uL — ABNORMAL LOW (ref 4.22–5.81)
RDW: 13.5 % (ref 11.5–15.5)
WBC: 4.3 K/uL (ref 4.0–10.5)
nRBC: 0 % (ref 0.0–0.2)

## 2023-11-23 LAB — COMPREHENSIVE METABOLIC PANEL WITH GFR
ALT: 11 U/L (ref 0–44)
AST: 10 U/L — ABNORMAL LOW (ref 15–41)
Albumin: 3.7 g/dL (ref 3.5–5.0)
Alkaline Phosphatase: 87 U/L (ref 38–126)
Anion gap: 12 (ref 5–15)
BUN: 18 mg/dL (ref 8–23)
CO2: 25 mmol/L (ref 22–32)
Calcium: 9.6 mg/dL (ref 8.9–10.3)
Chloride: 98 mmol/L (ref 98–111)
Creatinine, Ser: 0.59 mg/dL — ABNORMAL LOW (ref 0.61–1.24)
GFR, Estimated: >60 mL/min (ref >60)
Glucose, Bld: 156 mg/dL — ABNORMAL HIGH (ref 70–99)
Potassium: 4.3 mmol/L (ref 3.5–5.1)
Sodium: 136 mmol/L (ref 135–145)
Total Bilirubin: 0.3 mg/dL (ref 0.0–1.2)
Total Protein: 6.9 g/dL (ref 6.5–8.1)

## 2023-11-23 LAB — T4, FREE: Free T4: 1.25 ng/dL — ABNORMAL HIGH (ref 0.61–1.12)

## 2023-11-23 LAB — TSH: TSH: 8.15 u[IU]/mL — ABNORMAL HIGH (ref 0.350–4.500)

## 2023-11-23 LAB — MAGNESIUM: Magnesium: 1.7 mg/dL (ref 1.7–2.4)

## 2023-11-23 MED ORDER — CETIRIZINE HCL 10 MG/ML IV SOLN
10.0000 mg | Freq: Once | INTRAVENOUS | Status: AC
  Administered 2023-11-23: 10 mg via INTRAVENOUS
  Filled 2023-11-23: qty 1

## 2023-11-23 MED ORDER — SODIUM CHLORIDE 0.9 % IV SOLN
260.0000 mg | Freq: Once | INTRAVENOUS | Status: AC
  Administered 2023-11-23: 260 mg via INTRAVENOUS
  Filled 2023-11-23: qty 26

## 2023-11-23 MED ORDER — FAMOTIDINE IN NACL 20-0.9 MG/50ML-% IV SOLN
20.0000 mg | Freq: Once | INTRAVENOUS | Status: AC
  Administered 2023-11-23: 20 mg via INTRAVENOUS
  Filled 2023-11-23: qty 50

## 2023-11-23 MED ORDER — PALONOSETRON HCL INJECTION 0.25 MG/5ML
0.2500 mg | Freq: Once | INTRAVENOUS | Status: AC
  Administered 2023-11-23: 0.25 mg via INTRAVENOUS
  Filled 2023-11-23: qty 5

## 2023-11-23 MED ORDER — SODIUM CHLORIDE 0.9 % IV SOLN
50.0000 mg/m2 | Freq: Once | INTRAVENOUS | Status: AC
  Administered 2023-11-23: 108 mg via INTRAVENOUS
  Filled 2023-11-23: qty 18

## 2023-11-23 MED ORDER — DEXAMETHASONE SODIUM PHOSPHATE 10 MG/ML IJ SOLN
10.0000 mg | Freq: Once | INTRAMUSCULAR | Status: AC
  Administered 2023-11-23: 10 mg via INTRAVENOUS
  Filled 2023-11-23: qty 1

## 2023-11-23 MED ORDER — SODIUM CHLORIDE 0.9 % IV SOLN: INTRAVENOUS | Status: DC

## 2023-11-23 NOTE — Progress Notes (Signed)
 Nutrition Follow-up:  Patient with malignant neoplasm of lower third esophagus. He has completed neoadjuvant FLOT q14d x 8 cycles. Esophagectomy at [facility] (8/12) aborted due to concerns for gastric tumor. S/p Jtube. Pathology negative for cancer. Patient is now receiving concurrent chemoradiation with weekly carboplatin /paclitaxel  (start 9/15).   Met with patient and wife in infusion. Patient reports esophageal discomfort with swallow. He is fatigued and reports sleeping more. Patient received cortisone injection to his knee. Pain has resolved for now. Patient has been tolerating Nutren 1.5 @ 75 ml/hr x 8-10 hours. He is agreeable to increase length of feeds now that he is unable to tolerate solid foods.    Medications: reviewed   Labs: glucose 156, Cr 0.59  Anthropometrics: Wt 207 lb 3.2 oz today - trending down (6% in last 11 weeks - significant)  9/22 - 212 lb 12.8 oz 8/28 - 209 lb 10.5 oz  7/21 - 221 lb 6.4 oz    NUTRITION DIAGNOSIS: Food and nutrition related knowledge deficit improving    INTERVENTION:  Pt will increase rate by 5 ml q24 to 85 ml/hr x 18 hr/day (1530 ml/day/~6 cartons - provides 2250 kcal, 102 g, 1146 ml free water). Provide 60 ml water flush before and after medications Oral intake of thin liquids as tolerated     MONITORING, EVALUATION, GOAL: wt trends, intake    NEXT VISIT: Monday October 13 during infusion

## 2023-11-23 NOTE — Progress Notes (Signed)

## 2023-11-23 NOTE — Patient Instructions (Signed)

## 2023-11-23 NOTE — Progress Notes (Signed)
 Patient Care Team: System, Provider Not In as PCP - General Mallipeddi, [name], MD as PCP - Cardiology (Cardiology) [name], MD as Medical Oncologist (Medical Oncology) [name], RN as Oncology Nurse Navigator (Medical Oncology) Dr Willma [facility], OD (Optometry) [name], [name], MD as Consulting Physician (Gastroenterology)  Clinic Day:  10/15/2023  Referring physician: [name], MD   CHIEF COMPLAINT:  CC: Stage III (T3 N0 M0 G3) poorly differentiated adenocarcinoma of distal esophagus   ASSESSMENT & PLAN:   Assessment & Plan: Samuel Hubbard  is a 68 y.o. male with stage III (T3 N0 M0 G3) poorly differentiated adenocarcinoma of distal esophagus  Assessment and Plan Assessment & Plan Malignant neoplasm of the lower third of the esophagus  Stage III adenocarcinoma of distal esophagus S/p perioperative chemotherapy with FLOT X 8 cycles Had an attempted esophagectomy at [facility] but aborted due evidence of bulky tumor at [facility], splenic, and omental metastases and metastases localized to gastric fundus and body limiting width of potential conduit.  Omental nodule biopsies did not show any malignancy 11/02/2023: Started chemo RT with carboplatin  and paclitaxel   -Cycle 1 day 15 today.  Tolerating well so far - Labs reviewed today: CMP: Normal creatinine, normal LFTs, CBC: WBC: Normal, hemoglobin: 11.0, platelets: 199.  Physical exam stable - Okay to proceed with chemotherapy today. -Will monitor for worsening peripheral neuropathy -Continue radiation therapy with Dr. [name] at [facility]   RTC prior to next cycle of chemotherapy   Dysphagia and weight loss secondary to cancer therapy Dysphagia and weight loss due to radiation therapy. Five-pound weight loss since starting radiation. Dysphagia likely from radiation-induced swelling.  - Increase feeding tube nutrition as tolerated. - Consult with [name](nutritionist) for nutritional  management.  Peripheral neuropathy Numbness and tingling in extremities likely related to cancer treatment.  Stable at this time  Hypothyroidism post-thyroidectomy Hypothyroidism managed with levothyroxine . Current dose is high but stable.  Taking 2 x112 mcg daily.  Thyroid  function tests pending today.  - Continue current thyroid  medication. - Review thyroid  function test results when available. - Patient's primary care left from this practice and is requesting for a new primary care.  Will refer to Dr. Bluford as per patient's preference.  Major depressive disorder Mood symptoms may be exacerbated by cancer treatment. On citalopram .   - Monitor mood symptoms and reassess after completion of cancer therapy. - Continue citalopram  as prescribed.   The patient understands the plans discussed today and is in agreement with them.  He knows to contact our office if he develops concerns prior to his next appointment.  40 minutes of total time was spent for this patient encounter, including preparation, face-to-face counseling with the patient and coordination of care, physical exam, and documentation of the encounter.    Siad Davonna, MD  [facility] [place] - A DEPT OF JOLYNN HUNT [facility] [address] [place] [address] Dept: [phone] Dept Fax: [phone]   No orders of the defined types were placed in this encounter.    ONCOLOGY HISTORY:   I have reviewed his chart and materials related to his cancer extensively and collaborated history with the patient. 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  Pathology: Poorly differentiated adenocarcinoma with mucinous and signet  ring cell features. -01/05/2023: CT CAP:  Substantial irregular wall thickening of the distal half of the thoracic esophagus extending over a 8.5 cm length, compatible with tumor. No findings of metastatic disease in the chest, abdomen, or pelvis. -01/22/2023: Initial PET: Circumferential wall thickening in the distal third of the thoracic esophagus, with maximum SUV 5.7, compatible with malignancy. -01/29/2023: Germline mutation testing: Negative with the VUS of APC and RET -02/02/2023: EGD/EUS: Large fungating and ulcerating mass in the distal esophagus, at the GE junction extending into the cardia, 32 to 42 cm from the incisors. Mass was partially obstructing and circumferential. Sonographic evidence suggesting invasion into the muscularis propria and adventitia. 1 enlarged lymph node in the lower paraesophageal mediastinum measuring 4 x 3 mm, which was round, hypoechoic and had well-defined margins. FNB not performed.  -[facility] NGS testing: HER2 (0), MS-stable, TMB-low, CLDN 18-negative -02/16/2023-04/09/2023: 4 cycles of neoadjuvant FLOT (ESOPEC trial) -04/23/2023: Patient evaluated by Dr. Medora rodes surgeon], who recommended esophagectomy. Patient was reluctant to undergo surgery, so proceeded with EGD with dilation and biopsy.  -05/11/2023: EGD: Severe erosion in the distal esophagus consistent with the location of the tumor. The esophagus is significantly narrowed. Biopsies taken at 36, 38, 40 cm.  -Pathology ar 36 cm and 38 cm: Atypical cells present suspicious for adenocarcinoma.  -Pathology at 40 cm: Poorly differentiated invasive mucinous adenocarcinoma with patchy signet ring cells.  -06/04/2023: Patient seen by Dr. Sheilda coons oncologist] at [facility] who recommended 4 additional cycles of FLOT and surgical reevaluation. -06/15/2023 - 07/27/2023: 4 more cycles of FLOT completed -08/27/2023: PET: Mild thickening of distal esophagus with mild hypermetabolic activity. No evidence of  hypermetabolic disease in the chest, abdomen or pelvis.  -09/29/2023: CT CAP: No measurable peritoneal or omental disease. Small volume trace free fluid versus ill-defined omental/peritoneal implants as above. Diffuse wall thickening of the gastroesophageal junction, compatible with malignancy.  Bilateral lower lung predominant consolidation and groundglass, suspicious for aspiration and atelectasis, infection could have a similar appearance. Trace bilateral pleural effusions.  -09/29/2023: Attempted esophagectomy with Dr.Saltify at [facility]  but aborted due evidence of bulky tumor at [facility], splenic, and omental metastases and metastases localized to gastric fundus and body limiting width of potential conduit. A j-tube was placed. Omental nodules were biopsied -09/29/2023: Right omentum nodule biopsy.  -Pathology of right omentum nodule 2: Mesothelial-lined fibroadipose tissue and smooth muscle with no pathologic change. Negative for malignancy. -Pathology of right omental nodule 1: Fibroadipose tissue with necrosis. Negative for malignancy. - 11/02/2023-current: ChemoRT with carboplatin  and paclitaxel . (Chose not 5-FU based regimen as patient did not have great response to 5-FU based regimen previously)  Current Treatment: Chemo RT with carboplatin  and paclitaxel   INTERVAL HISTORY:   Discussed the use of AI scribe software for clinical note transcription with the patient, who gave verbal consent to proceed.  History of Present Illness Samuel Hubbard is a 68 year old male undergoing chemotherapy and radiation therapy for esophageal carcinoma presented for follow-up  He has experienced a weight loss of five pounds since starting radiation therapy, with his weight previously stable at 210 pounds. He attributes this weight loss to the effects of radiation and is concerned about his nutritional intake. He is currently using a feeding tube to manage swallowing difficulties, which have worsened with  radiation.  He is experiencing severe fatigue, describing it as feeling like he is 'going downhill a little bit' due to being at the peak of his  radiation treatment. He sleeps a lot during the day.   He reports difficulty with swallowing and is currently using a feeding tube.  He experiences numbness and tingling in his feet, which become cold easily, and describes the numbness as affecting his fingers as well.  He takes two thyroid  pills in the morning, each with a dose of 112 mcg, due to the absence of a thyroid  gland.  He is taking citalopram  for anxiety, although he is unsure if it is effective. He has been on this medication for a long time.   I have reviewed the past medical history, past surgical history, social history and family history with the patient and they are unchanged from previous note.  ALLERGIES:  is allergic to other, wasp venom, and zolpidem.  MEDICATIONS:  Current Outpatient Medications  Medication Sig Dispense Refill   apixaban  (ELIQUIS ) 5 MG TABS tablet Take 5 mg by mouth 2 (two) times daily.     atorvastatin  (LIPITOR) 40 MG tablet TAKE 1 TABLET BY MOUTH DAILY 90 tablet 0   blood glucose meter kit and supplies Dispense based on patient and insurance preference. Once daily testing DX E11.9 1 each 0   citalopram  (CELEXA ) 20 MG tablet TAKE 1 TABLET BY MOUTH EVERY DAY 90 tablet 3   dexamethasone  (DECADRON ) 4 MG tablet Take 2 tablets daily for 2 days, start the day after chemotherapy. Take with food. 30 tablet 1   gabapentin  (NEURONTIN ) 300 MG capsule Take 1 capsule (300 mg total) by mouth at bedtime. 30 capsule 1   glucose blood test strip Use as instructed 100 each 12   Lancets 30G MISC Once daily testing dx e11.9 100 each 5   levothyroxine  (SYNTHROID ) 112 MCG tablet TAKE 2 TABLETS BY MOUTH PRIOR TO BREAKFAST 180 tablet 0   lidocaine -prilocaine  (EMLA ) cream Apply to affected area once 30 g 3   metFORMIN  (GLUCOPHAGE ) 500 MG tablet Take 2 tablets (1,000 mg total) by  mouth 2 (two) times daily with a meal. 120 tablet 3   metoprolol  tartrate (LOPRESSOR ) 25 MG tablet Take 25 mg by mouth 2 (two) times daily.     nitroGLYCERIN  (NITROSTAT ) 0.4 MG SL tablet Place 1 tablet (0.4 mg total) under the tongue every 5 (five) minutes as needed for chest pain. 50 tablet 3   olmesartan  (BENICAR ) 40 MG tablet TAKE 1 TABLET BY MOUTH EVERY DAY 90 tablet 1   omeprazole  (PRILOSEC) 40 MG capsule Take 1 capsule (40 mg total) by mouth 2 (two) times daily. Swallow granules 180 capsule 3   oxyCODONE  (ROXICODONE ) 5 MG/5ML solution Take 5 mLs (5 mg total) by mouth every 6 (six) hours as needed for severe pain (pain score 7-10). 600 mL 0   prochlorperazine  (COMPAZINE ) 10 MG tablet Take 1 tablet (10 mg total) by mouth every 6 (six) hours as needed for nausea or vomiting. 30 tablet 1   sucralfate  (CARAFATE ) 1 g tablet 1 g by Enteral route.     TYLENOL  CHILDRENS 160 MG/5ML suspension TAKE 20.3 ML BY MOUTH EVERY 6 HOURS AS NEEDED     oxyCODONE  (ROXICODONE ) 5 MG/5ML solution Place 5 mLs (5 mg total) into feeding tube every 6 (six) hours as needed for severe pain (pain score 7-10). 100 mL 0   No current facility-administered medications for this visit.    REVIEW OF SYSTEMS:   Constitutional: Denies fevers, chills or abnormal weight loss Eyes: Denies blurriness of vision Ears, nose, mouth, throat, and face: Denies mucositis or sore throat Respiratory: Denies  cough, dyspnea or wheezes Cardiovascular: Denies palpitation, chest discomfort or lower extremity swelling Gastrointestinal:  Denies nausea, heartburn or change in bowel habits Skin: Denies abnormal skin rashes Lymphatics: Denies new lymphadenopathy or easy bruising Neurological:Denies numbness, tingling or new weaknesses Behavioral/Psych: Mood is stable, no new changes  All other systems were reviewed with the patient and are negative.   VITALS:  There were no vitals taken for this visit.  Wt Readings from Last 3 Encounters:   11/23/23 207 lb 3.2 oz (94 kg)  11/09/23 212 lb 12.8 oz (96.5 kg)  11/02/23 212 lb 6.4 oz (96.3 kg)    There is no height or weight on file to calculate BMI.  Performance status (ECOG): 1 - Symptomatic but completely ambulatory  PHYSICAL EXAM:   GENERAL:alert, no distress and comfortable SKIN: skin color, texture, turgor are normal, no rashes or significant lesions, port site clean LYMPH:  no palpable lymphadenopathy in the cervical, axillary or inguinal LUNGS: clear to auscultation and percussion with normal breathing effort HEART: regular rate & rhythm and no murmurs and no lower extremity edema ABDOMEN:abdomen soft, non-tender and normal bowel sounds, PEG tube site clean Musculoskeletal:no cyanosis of digits and no clubbing  NEURO: alert & oriented x 3 with fluent speech, no focal motor/sensory deficits  LABORATORY DATA:  I have reviewed the data as listed  Lab Results  Component Value Date   WBC 4.3 11/23/2023   NEUTROABS 3.5 11/23/2023   HGB 11.0 (L) 11/23/2023   HCT 34.0 (L) 11/23/2023   MCV 91.2 11/23/2023   PLT 199 11/23/2023     Chemistry      Component Value Date/Time   NA 136 11/23/2023 0915   NA 140 12/01/2022 1513   K 4.3 11/23/2023 0915   CL 98 11/23/2023 0915   CO2 25 11/23/2023 0915   BUN 18 11/23/2023 0915   BUN 17 12/01/2022 1513   CREATININE 0.59 (L) 11/23/2023 0915   CREATININE 1.10 10/13/2018 1054      Component Value Date/Time   CALCIUM  9.6 11/23/2023 0915   ALKPHOS 87 11/23/2023 0915   AST 10 (L) 11/23/2023 0915   ALT 11 11/23/2023 0915   BILITOT 0.3 11/23/2023 0915   BILITOT 0.3 12/01/2022 1513      Latest Reference Range & Units 09/08/23 16:04  TSH 0.350 - 4.500 uIU/mL 11.650 (H)  (H): Data is abnormally high  RADIOGRAPHIC STUDIES: I have personally reviewed the radiological images as listed and agreed with the findings in the report.  None new to review

## 2023-11-23 NOTE — Patient Instructions (Signed)
 [facility] [place] - A DEPT OF [facility]  Discharge Instructions: Thank you for choosing [facility] to provide your oncology and hematology care.  If you have a lab appointment with the Cancer Center - please note that after April 8th, 2024, all labs will be drawn in the cancer center.  You do not have to check in or register with the main entrance as you have in the past but will complete your check-in in the cancer center.  Wear comfortable clothing and clothing appropriate for easy access to any Portacath or PICC line.   We strive to give you quality time with your provider. You may need to reschedule your appointment if you arrive late (15 or more minutes).  Arriving late affects you and other patients whose appointments are after yours.  Also, if you miss three or more appointments without notifying the office, you may be dismissed from the clinic at the provider's discretion.      For prescription refill requests, have your pharmacy contact our office and allow 72 hours for refills to be completed.    Today you received the following chemotherapy and/or immunotherapy agents taxol and carboplatin      To help prevent nausea and vomiting after your treatment, we encourage you to take your nausea medication as directed.  BELOW ARE SYMPTOMS THAT SHOULD BE REPORTED IMMEDIATELY: *FEVER GREATER THAN 100.4 F (38 C) OR HIGHER *CHILLS OR SWEATING *NAUSEA AND VOMITING THAT IS NOT CONTROLLED WITH YOUR NAUSEA MEDICATION *UNUSUAL SHORTNESS OF BREATH *UNUSUAL BRUISING OR BLEEDING *URINARY PROBLEMS (pain or burning when urinating, or frequent urination) *BOWEL PROBLEMS (unusual diarrhea, constipation, pain near the anus) TENDERNESS IN MOUTH AND THROAT WITH OR WITHOUT PRESENCE OF ULCERS (sore throat, sores in mouth, or a toothache) UNUSUAL RASH, SWELLING OR PAIN  UNUSUAL VAGINAL DISCHARGE OR ITCHING   Items with * indicate a potential emergency and should be  followed up as soon as possible or go to the Emergency Department if any problems should occur.  Please show the CHEMOTHERAPY ALERT CARD or IMMUNOTHERAPY ALERT CARD at check-in to the Emergency Department and triage nurse.  Should you have questions after your visit or need to cancel or reschedule your appointment, please contact [facility] [place] - A DEPT OF Eligha Bridegroom [facility] [phone]  and follow the prompts.  Office hours are 8:00 a.m. to 4:30 p.m. Monday - Friday. Please note that voicemails left after 4:00 p.m. may not be returned until the following business day.  We are closed weekends and major holidays. You have access to a nurse at all times for urgent questions. Please call the main number to the clinic [phone] and follow the prompts.  For any non-urgent questions, you may also contact your provider using MyChart. We now offer e-Visits for anyone 45 and older to request care online for non-urgent symptoms. For details visit [email or web address].   Also download the MyChart app! Go to the app store, search "MyChart", open the app, select [place], and log in with your MyChart username and password.

## 2023-11-26 ENCOUNTER — Inpatient Hospital Stay: Admitting: Licensed Clinical Social Worker

## 2023-11-26 ENCOUNTER — Inpatient Hospital Stay

## 2023-11-26 ENCOUNTER — Other Ambulatory Visit: Payer: Self-pay

## 2023-11-26 ENCOUNTER — Other Ambulatory Visit: Payer: Self-pay | Admitting: Oncology

## 2023-11-26 VITALS — BP 103/74 | HR 108 | Resp 20

## 2023-11-26 DIAGNOSIS — C155 Malignant neoplasm of lower third of esophagus: Secondary | ICD-10-CM | POA: Diagnosis not present

## 2023-11-26 DIAGNOSIS — Z5111 Encounter for antineoplastic chemotherapy: Secondary | ICD-10-CM | POA: Diagnosis not present

## 2023-11-26 DIAGNOSIS — G629 Polyneuropathy, unspecified: Secondary | ICD-10-CM | POA: Diagnosis not present

## 2023-11-26 MED ORDER — ONDANSETRON HCL 4 MG/2ML IJ SOLN
8.0000 mg | Freq: Once | INTRAMUSCULAR | Status: AC
  Administered 2023-11-26: 8 mg via INTRAVENOUS
  Filled 2023-11-26: qty 4

## 2023-11-26 MED ORDER — SODIUM CHLORIDE 0.9 % IV SOLN: Freq: Once | INTRAVENOUS | Status: AC

## 2023-11-26 NOTE — Patient Instructions (Signed)
 [facility] [place] - A DEPT OF [place]  Discharge Instructions: Thank you for choosing [facility] to provide your oncology and hematology care.  If you have a lab appointment with the Cancer Center - please note that after April 8th, 2024, all labs will be drawn in the cancer center.  You do not have to check in or register with the main entrance as you have in the past but will complete your check-in in the cancer center.  Wear comfortable clothing and clothing appropriate for easy access to any Portacath or PICC line.   We strive to give you quality time with your provider. You may need to reschedule your appointment if you arrive late (15 or more minutes).  Arriving late affects you and other patients whose appointments are after yours.  Also, if you miss three or more appointments without notifying the office, you may be dismissed from the clinic at the provider's discretion.      For prescription refill requests, have your pharmacy contact our office and allow 72 hours for refills to be completed.    Today you received the following : hydration fluids and anti nausea meds today.    To help prevent nausea and vomiting after your treatment, we encourage you to take your nausea medication as directed.  BELOW ARE SYMPTOMS THAT SHOULD BE REPORTED IMMEDIATELY: *FEVER GREATER THAN 100.4 F (38 C) OR HIGHER *CHILLS OR SWEATING *NAUSEA AND VOMITING THAT IS NOT CONTROLLED WITH YOUR NAUSEA MEDICATION *UNUSUAL SHORTNESS OF BREATH *UNUSUAL BRUISING OR BLEEDING *URINARY PROBLEMS (pain or burning when urinating, or frequent urination) *BOWEL PROBLEMS (unusual diarrhea, constipation, pain near the anus) TENDERNESS IN MOUTH AND THROAT WITH OR WITHOUT PRESENCE OF ULCERS (sore throat, sores in mouth, or a toothache) UNUSUAL RASH, SWELLING OR PAIN  UNUSUAL VAGINAL DISCHARGE OR ITCHING   Items with * indicate a potential emergency and should be followed up as  soon as possible or go to the Emergency Department if any problems should occur.  Please show the CHEMOTHERAPY ALERT CARD or IMMUNOTHERAPY ALERT CARD at check-in to the Emergency Department and triage nurse.  Should you have questions after your visit or need to cancel or reschedule your appointment, please contact [facility] [place] - A DEPT OF [facility] [phone]  and follow the prompts.  Office hours are 8:00 a.m. to 4:30 p.m. Monday - Friday. Please note that voicemails left after 4:00 p.m. may not be returned until the following business day.  We are closed weekends and major holidays. You have access to a nurse at all times for urgent questions. Please call the main number to the clinic [phone] and follow the prompts.  For any non-urgent questions, you may also contact your provider using MyChart. We now offer e-Visits for anyone 85 and older to request care online for non-urgent symptoms. For details visit [email or web address].   Also download the MyChart app! Go to the app store, search MyChart, open the app, select [place], and log in with your MyChart username and password.

## 2023-11-26 NOTE — Progress Notes (Signed)
 Patient came into clinic today for hydration fluids and nausea meds. Presented with extreme fatigue, nausea, dizziness, chest discomfort-pressure. Patient states he has the chest pressure often, not new or worse than usual. MD aware, EKG done, hydration fluids and Zofran  administered per orders. Will continue to monitor.   Patient stated he is feeling better. Patient tolerated it well without problems. Vitals stable and discharged home from clinic via wheelchair. Follow up as scheduled.

## 2023-11-27 ENCOUNTER — Encounter: Payer: Self-pay | Admitting: Oncology

## 2023-11-27 NOTE — Progress Notes (Signed)
 [facility] CSW Progress Note   Interventions: CSW contacted pt's wife by phone to check in.  Pt has experienced some set backs and changes to treatment plan which has unexpected.  CSW provided space for pt's wife to express her feelings regarding their situation.  Emotional support provided.  CSW reiterated availability for counseling should it be felt at any time it would be beneficial.        Follow Up Plan:  Patient will contact CSW with any support or resource needs    Devere JONELLE Manna, LCSW Clinical Social Worker [facility]

## 2023-11-30 ENCOUNTER — Inpatient Hospital Stay: Admitting: Oncology

## 2023-11-30 ENCOUNTER — Other Ambulatory Visit: Payer: Self-pay | Admitting: Oncology

## 2023-11-30 ENCOUNTER — Inpatient Hospital Stay

## 2023-11-30 ENCOUNTER — Inpatient Hospital Stay: Admitting: Dietician

## 2023-11-30 VITALS — BP 115/80 | HR 85 | Resp 18

## 2023-11-30 DIAGNOSIS — R1319 Other dysphagia: Secondary | ICD-10-CM

## 2023-11-30 DIAGNOSIS — G6289 Other specified polyneuropathies: Secondary | ICD-10-CM

## 2023-11-30 DIAGNOSIS — C155 Malignant neoplasm of lower third of esophagus: Secondary | ICD-10-CM

## 2023-11-30 DIAGNOSIS — Z95828 Presence of other vascular implants and grafts: Secondary | ICD-10-CM

## 2023-11-30 DIAGNOSIS — Z5111 Encounter for antineoplastic chemotherapy: Secondary | ICD-10-CM | POA: Diagnosis not present

## 2023-11-30 LAB — COMPREHENSIVE METABOLIC PANEL WITH GFR
ALT: 13 U/L (ref 0–44)
AST: 12 U/L — ABNORMAL LOW (ref 15–41)
Albumin: 3.6 g/dL (ref 3.5–5.0)
Alkaline Phosphatase: 79 U/L (ref 38–126)
Anion gap: 11 (ref 5–15)
BUN: 28 mg/dL — ABNORMAL HIGH (ref 8–23)
CO2: 26 mmol/L (ref 22–32)
Calcium: 9.6 mg/dL (ref 8.9–10.3)
Chloride: 99 mmol/L (ref 98–111)
Creatinine, Ser: 0.56 mg/dL — ABNORMAL LOW (ref 0.61–1.24)
GFR, Estimated: >60 mL/min (ref >60)
Glucose, Bld: 163 mg/dL — ABNORMAL HIGH (ref 70–99)
Potassium: 4.5 mmol/L (ref 3.5–5.1)
Sodium: 136 mmol/L (ref 135–145)
Total Bilirubin: 0.2 mg/dL (ref 0.0–1.2)
Total Protein: 6.6 g/dL (ref 6.5–8.1)

## 2023-11-30 LAB — CBC WITH DIFFERENTIAL/PLATELET
Abs Immature Granulocytes: 0.04 K/uL (ref 0.00–0.07)
Basophils Absolute: 0 K/uL (ref 0.0–0.1)
Basophils Relative: 1 %
Eosinophils Absolute: 0.1 K/uL (ref 0.0–0.5)
Eosinophils Relative: 2 %
HCT: 32.7 % — ABNORMAL LOW (ref 39.0–52.0)
Hemoglobin: 10.6 g/dL — ABNORMAL LOW (ref 13.0–17.0)
Immature Granulocytes: 1 %
Lymphocytes Relative: 4 %
Lymphs Abs: 0.2 K/uL — ABNORMAL LOW (ref 0.7–4.0)
MCH: 29.4 pg (ref 26.0–34.0)
MCHC: 32.4 g/dL (ref 30.0–36.0)
MCV: 90.6 fL (ref 80.0–100.0)
Monocytes Absolute: 0.4 K/uL (ref 0.1–1.0)
Monocytes Relative: 9 %
Neutro Abs: 3.6 K/uL (ref 1.7–7.7)
Neutrophils Relative %: 83 %
Platelets: 181 K/uL (ref 150–400)
RBC: 3.61 MIL/uL — ABNORMAL LOW (ref 4.22–5.81)
RDW: 13.8 % (ref 11.5–15.5)
WBC: 4.3 K/uL (ref 4.0–10.5)
nRBC: 0 % (ref 0.0–0.2)

## 2023-11-30 LAB — MAGNESIUM: Magnesium: 1.5 mg/dL — ABNORMAL LOW (ref 1.7–2.4)

## 2023-11-30 MED ORDER — DEXAMETHASONE SOD PHOSPHATE PF 10 MG/ML IJ SOLN
10.0000 mg | Freq: Once | INTRAMUSCULAR | Status: AC
  Administered 2023-11-30: 10 mg via INTRAVENOUS

## 2023-11-30 MED ORDER — SODIUM CHLORIDE 0.9 % IV SOLN: Freq: Once | INTRAVENOUS | Status: AC

## 2023-11-30 MED ORDER — FAMOTIDINE IN NACL 20-0.9 MG/50ML-% IV SOLN
20.0000 mg | Freq: Once | INTRAVENOUS | Status: AC
  Administered 2023-11-30: 20 mg via INTRAVENOUS
  Filled 2023-11-30: qty 50

## 2023-11-30 MED ORDER — SODIUM CHLORIDE 0.9 % IV SOLN
260.0000 mg | Freq: Once | INTRAVENOUS | Status: AC
  Administered 2023-11-30: 260 mg via INTRAVENOUS
  Filled 2023-11-30: qty 26

## 2023-11-30 MED ORDER — PALONOSETRON HCL INJECTION 0.25 MG/5ML
0.2500 mg | Freq: Once | INTRAVENOUS | Status: AC
  Administered 2023-11-30: 0.25 mg via INTRAVENOUS
  Filled 2023-11-30: qty 5

## 2023-11-30 MED ORDER — SODIUM CHLORIDE 0.9 % IV SOLN: INTRAVENOUS | Status: DC

## 2023-11-30 MED ORDER — CETIRIZINE HCL 10 MG/ML IV SOLN
10.0000 mg | Freq: Once | INTRAVENOUS | Status: AC
  Administered 2023-11-30: 10 mg via INTRAVENOUS
  Filled 2023-11-30: qty 1

## 2023-11-30 MED ORDER — SODIUM CHLORIDE 0.9 % IV SOLN
50.0000 mg/m2 | Freq: Once | INTRAVENOUS | Status: AC
  Administered 2023-11-30: 108 mg via INTRAVENOUS
  Filled 2023-11-30: qty 18

## 2023-11-30 MED ORDER — MAGNESIUM SULFATE 2 GM/50ML IV SOLN
2.0000 g | Freq: Once | INTRAVENOUS | Status: AC
  Administered 2023-11-30: 2 g via INTRAVENOUS
  Filled 2023-11-30: qty 50

## 2023-11-30 NOTE — Patient Instructions (Signed)

## 2023-11-30 NOTE — Progress Notes (Signed)
 Nutrition Follow-up:  Patient with malignant neoplasm of lower third esophagus. He has completed neoadjuvant FLOT q14d x 8 cycles. Esophagectomy at [facility] (8/12) aborted due to concerns for gastric tumor. S/p Jtube. Pathology negative for cancer. Patient is now receiving concurrent chemoradiation with weekly carboplatin /paclitaxel  (start 9/15).   Met with patient and wife in infusion. He reports persistent fatigue. Patient has no energy. He denies odynophagia. Tolerating sips of Ensure/Boost. Patient continues relying on small bowel feeds. Currently at 70 ml/hr x 12-14 hours due to feeling full/bloating. Patient tolerating Nutren 1.5 @ 75 ml last week. Denies nausea, vomiting, diarrhea, constipation.   Medications: reviewed   Labs: Mg 1.5, glucose 163, BUN 28, Cr 0.56  Anthropometrics: Wt 206 lb 9.1 oz today   10/6 - 207 lb 3.2 oz  9/22 - 212 lb 12.8 oz    NUTRITION DIAGNOSIS: Food and nutrition related knowledge deficit improving    INTERVENTION:  Continue working to increase Jtube feedings to goal as tolerated (85 ml/hr x 18 hr/day ~ 6 cartons/day provides 2250 kcal, 102 g, 1146 ml free water) Continue drinking Boost HP orally as tolerated Support and encouragement     MONITORING, EVALUATION, GOAL: wt trends, intake, TF   NEXT VISIT: Monday October 27 via telephone vs infusion to be determined

## 2023-11-30 NOTE — Patient Instructions (Signed)
 [facility] [place] - A DEPT OF [facility]  Discharge Instructions: Thank you for choosing [facility] to provide your oncology and hematology care.  If you have a lab appointment with the Cancer Center - please note that after April 8th, 2024, all labs will be drawn in the cancer center.  You do not have to check in or register with the main entrance as you have in the past but will complete your check-in in the cancer center.  Wear comfortable clothing and clothing appropriate for easy access to any Portacath or PICC line.   We strive to give you quality time with your provider. You may need to reschedule your appointment if you arrive late (15 or more minutes).  Arriving late affects you and other patients whose appointments are after yours.  Also, if you miss three or more appointments without notifying the office, you may be dismissed from the clinic at the provider's discretion.      For prescription refill requests, have your pharmacy contact our office and allow 72 hours for refills to be completed.    Today you received the following chemotherapy and/or immunotherapy agents Paclitaxel /Carboplatin   Paclitaxel  Injection What is this medication? PACLITAXEL  (PAK li TAX el) treats some types of cancer. It works by slowing down the growth of cancer cells. This medicine may be used for other purposes; ask your health care provider or pharmacist if you have questions. COMMON BRAND NAME(S): Onxol, Taxol  What should I tell my care team before I take this medication? They need to know if you have any of these conditions: Heart disease Liver disease Low white blood cell levels An unusual or allergic reaction to paclitaxel , other medications, foods, dyes, or preservatives If you or your partner are pregnant or trying to get pregnant Breast-feeding How should I use this medication? This medication is injected into a vein. It is given by your care team in a  hospital or clinic setting. Talk to your care team about the use of this medication in children. While it may be given to children for selected conditions, precautions do apply. Overdosage: If you think you have taken too much of this medicine contact a poison control center or emergency room at once. NOTE: This medicine is only for you. Do not share this medicine with others. What if I miss a dose? Keep appointments for follow-up doses. It is important not to miss your dose. Call your care team if you are unable to keep an appointment. What may interact with this medication? Do not take this medication with any of the following: Live virus vaccines Other medications may affect the way this medication works. Talk with your care team about all of the medications you take. They may suggest changes to your treatment plan to lower the risk of side effects and to make sure your medications work as intended. This list may not describe all possible interactions. Give your health care provider a list of all the medicines, herbs, non-prescription drugs, or dietary supplements you use. Also tell them if you smoke, drink alcohol, or use illegal drugs. Some items may interact with your medicine. What should I watch for while using this medication? Your condition will be monitored carefully while you are receiving this medication. You may need blood work while taking this medication. This medication may make you feel generally unwell. This is not uncommon as chemotherapy can affect healthy cells as well as cancer cells. Report any  side effects. Continue your course of treatment even though you feel ill unless your care team tells you to stop. This medication can cause serious allergic reactions. To reduce the risk, your care team may give you other medications to take before receiving this one. Be sure to follow the directions from your care team. This medication may increase your risk of getting an infection.  Call your care team for advice if you get a fever, chills, sore throat, or other symptoms of a cold or flu. Do not treat yourself. Try to avoid being around people who are sick. This medication may increase your risk to bruise or bleed. Call your care team if you notice any unusual bleeding. Be careful brushing or flossing your teeth or using a toothpick because you may get an infection or bleed more easily. If you have any dental work done, tell your dentist you are receiving this medication. Talk to your care team if you may be pregnant. Serious birth defects can occur if you take this medication during pregnancy. Talk to your care team before breastfeeding. Changes to your treatment plan may be needed. What side effects may I notice from receiving this medication? Side effects that you should report to your care team as soon as possible: Allergic reactions--skin rash, itching, hives, swelling of the face, lips, tongue, or throat Heart rhythm changes--fast or irregular heartbeat, dizziness, feeling faint or lightheaded, chest pain, trouble breathing Increase in blood pressure Infection--fever, chills, cough, sore throat, wounds that don't heal, pain or trouble when passing urine, general feeling of discomfort or being unwell Low blood pressure--dizziness, feeling faint or lightheaded, blurry vision Low red blood cell level--unusual weakness or fatigue, dizziness, headache, trouble breathing Painful swelling, warmth, or redness of the skin, blisters or sores at the infusion site Pain, tingling, or numbness in the hands or feet Slow heartbeat--dizziness, feeling faint or lightheaded, confusion, trouble breathing, unusual weakness or fatigue Unusual bruising or bleeding Side effects that usually do not require medical attention (report to your care team if they continue or are bothersome): Diarrhea Hair loss Joint pain Loss of appetite Muscle pain Nausea Vomiting This list may not describe  all possible side effects. Call your doctor for medical advice about side effects. You may report side effects to FDA at 1-800-FDA-1088. Where should I keep my medication? This medication is given in a hospital or clinic. It will not be stored at home. NOTE: This sheet is a summary. It may not cover all possible information. If you have questions about this medicine, talk to your doctor, pharmacist, or health care provider.  2024 Elsevier/Gold Standard (2021-06-25 00:00:00)   Carboplatin  Injection What is this medication? CARBOPLATIN  (KAR boe pla tin) treats some types of cancer. It works by slowing down the growth of cancer cells. This medicine may be used for other purposes; ask your health care provider or pharmacist if you have questions. COMMON BRAND NAME(S): Paraplatin  What should I tell my care team before I take this medication? They need to know if you have any of these conditions: Blood disorders Hearing problems Kidney disease Recent or ongoing radiation therapy An unusual or allergic reaction to carboplatin , cisplatin, other medications, foods, dyes, or preservatives Pregnant or trying to get pregnant Breast-feeding How should I use this medication? This medication is injected into a vein. It is given by your care team in a hospital or clinic setting. Talk to your care team about the use of this medication in children. Special care may be  needed. Overdosage: If you think you have taken too much of this medicine contact a poison control center or emergency room at once. NOTE: This medicine is only for you. Do not share this medicine with others. What if I miss a dose? Keep appointments for follow-up doses. It is important not to miss your dose. Call your care team if you are unable to keep an appointment. What may interact with this medication? Medications for seizures Some antibiotics, such as amikacin, gentamicin , neomycin, streptomycin, tobramycin Vaccines This list may  not describe all possible interactions. Give your health care provider a list of all the medicines, herbs, non-prescription drugs, or dietary supplements you use. Also tell them if you smoke, drink alcohol, or use illegal drugs. Some items may interact with your medicine. What should I watch for while using this medication? Your condition will be monitored carefully while you are receiving this medication. You may need blood work while taking this medication. This medication may make you feel generally unwell. This is not uncommon, as chemotherapy can affect healthy cells as well as cancer cells. Report any side effects. Continue your course of treatment even though you feel ill unless your care team tells you to stop. In some cases, you may be given additional medications to help with side effects. Follow all directions for their use. This medication may increase your risk of getting an infection. Call your care team for advice if you get a fever, chills, sore throat, or other symptoms of a cold or flu. Do not treat yourself. Try to avoid being around people who are sick. Avoid taking medications that contain aspirin, acetaminophen , ibuprofen , naproxen , or ketoprofen unless instructed by your care team. These medications may hide a fever. Be careful brushing or flossing your teeth or using a toothpick because you may get an infection or bleed more easily. If you have any dental work done, tell your dentist you are receiving this medication. Talk to your care team if you wish to become pregnant or think you might be pregnant. This medication can cause serious birth defects. Talk to your care team about effective forms of contraception. Do not breast-feed while taking this medication. What side effects may I notice from receiving this medication? Side effects that you should report to your care team as soon as possible: Allergic reactions--skin rash, itching, hives, swelling of the face, lips, tongue, or  throat Infection--fever, chills, cough, sore throat, wounds that don't heal, pain or trouble when passing urine, general feeling of discomfort or being unwell Low red blood cell level--unusual weakness or fatigue, dizziness, headache, trouble breathing Pain, tingling, or numbness in the hands or feet, muscle weakness, change in vision, confusion or trouble speaking, loss of balance or coordination, trouble walking, seizures Unusual bruising or bleeding Side effects that usually do not require medical attention (report to your care team if they continue or are bothersome): Hair loss Nausea Unusual weakness or fatigue Vomiting This list may not describe all possible side effects. Call your doctor for medical advice about side effects. You may report side effects to FDA at 1-800-FDA-1088. Where should I keep my medication? This medication is given in a hospital or clinic. It will not be stored at home. NOTE: This sheet is a summary. It may not cover all possible information. If you have questions about this medicine, talk to your doctor, pharmacist, or health care provider.  2024 Elsevier/Gold Standard (2021-05-28 00:00:00)     BELOW ARE SYMPTOMS THAT SHOULD BE REPORTED IMMEDIATELY: *  FEVER GREATER THAN 100.4 F (38 C) OR HIGHER *CHILLS OR SWEATING *NAUSEA AND VOMITING THAT IS NOT CONTROLLED WITH YOUR NAUSEA MEDICATION *UNUSUAL SHORTNESS OF BREATH *UNUSUAL BRUISING OR BLEEDING *URINARY PROBLEMS (pain or burning when urinating, or frequent urination) *BOWEL PROBLEMS (unusual diarrhea, constipation, pain near the anus) TENDERNESS IN MOUTH AND THROAT WITH OR WITHOUT PRESENCE OF ULCERS (sore throat, sores in mouth, or a toothache) UNUSUAL RASH, SWELLING OR PAIN  UNUSUAL VAGINAL DISCHARGE OR ITCHING   Items with * indicate a potential emergency and should be followed up as soon as possible or go to the Emergency Department if any problems should occur.  Please show the CHEMOTHERAPY ALERT CARD  or IMMUNOTHERAPY ALERT CARD at check-in to the Emergency Department and triage nurse.  Should you have questions after your visit or need to cancel or reschedule your appointment, please contact [facility] [place] - A DEPT OF [facility] [phone]  and follow the prompts.  Office hours are 8:00 a.m. to 4:30 p.m. Monday - Friday. Please note that voicemails left after 4:00 p.m. may not be returned until the following business day.  We are closed weekends and major holidays. You have access to a nurse at all times for urgent questions. Please call the main number to the clinic [phone] and follow the prompts.  For any non-urgent questions, you may also contact your provider using MyChart. We now offer e-Visits for anyone 64 and older to request care online for non-urgent symptoms. For details visit [email or web address].   Also download the MyChart app! Go to the app store, search MyChart, open the app, select [place], and log in with your MyChart username and password.

## 2023-11-30 NOTE — Progress Notes (Signed)
 Patient presents today for Paclitaxel /Carboplatin  infusion. Patient is in satisfactory condition with no new complaints voiced.  Vital signs are stable. Labs reviewed by Dr. Davonna during the office visit and all labs are within treatment parameters. Patient will receive 1 Liter of NS over 1 hour per Dr.Kandala. Patient will also receive 2g IV magnesium  sulfate per provider's standing orders. We will proceed with treatment per MD orders.   Treatment given today per MD orders. Tolerated infusion without adverse affects. Vital signs stable. No complaints at this time. Discharged from clinic ambulatory in stable condition. Alert and oriented x 3. F/U with [facility] as scheduled.

## 2023-11-30 NOTE — Progress Notes (Signed)
 Patient Care Team: System, Provider Not In as PCP - General Mallipeddi, [name], MD as PCP - Cardiology (Cardiology) [name], MD as Medical Oncologist (Medical Oncology) [name], RN as Oncology Nurse Navigator (Medical Oncology) Dr [facility], OD (Optometry) [name], [name], MD as Consulting Physician (Gastroenterology)  Clinic Day:  10/15/2023  Referring physician: [name], MD   CHIEF COMPLAINT:  CC: Stage III (T3 N0 M0 G3) poorly differentiated adenocarcinoma of distal esophagus   ASSESSMENT & PLAN:   Assessment & Plan: Samuel Hubbard  is a 68 y.o. male with stage III (T3 N0 M0 G3) poorly differentiated adenocarcinoma of distal esophagus   Assessment and Plan Assessment & Plan Malignant neoplasm of the lower third of the esophagus  Stage III adenocarcinoma of distal esophagus S/p perioperative chemotherapy with FLOT X 8 cycles Had an attempted esophagectomy at [facility] but aborted due evidence of bulky tumor at [facility], splenic, and omental metastases and metastases localized to gastric fundus and body limiting width of potential conduit.  Omental nodule biopsies did not show any malignancy 11/02/2023: Started chemo RT with carboplatin  and paclitaxel   -C4D1 today.  Tolerating well so far - Labs reviewed today: CMP: Normal creatinine, normal LFTs, CBC: WBC: Normal, hemoglobin: 10.6, platelets: 181.  Physical exam stable - Okay to proceed with chemotherapy today. -Will monitor for worsening peripheral neuropathy - Will coordinate with Dr. [name] and the surgical team at [facility] for potential surgical intervention. -Will evaluate the need for a colonoscopy with the surgical team.  RTC prior to next cycle of chemotherapy  Dysphagia and weight loss secondary to cancer therapy Dysphagia and weight loss due to radiation therapy. Five-pound weight loss since starting radiation. Dysphagia likely from radiation-induced swelling.  -  Increase feeding tube nutrition as tolerated. - Consult with [name](nutritionist) for nutritional management. - Weight stable today.  Supportive therapy Continue IV fluids twice a week for supportive care.  Peripheral neuropathy Numbness and tingling in extremities likely related to cancer treatment.  Stable at this time  Hypothyroidism post-thyroidectomy Hypothyroidism managed with levothyroxine . Current dose is high but stable.  Taking 2 x112 mcg daily.  Thyroid  function tests pending today.  - Continue current thyroid  medication. - Review thyroid  function test results when available. - Awaiting an appointment from new primary care referral  Major depressive disorder Mood symptoms may be exacerbated by cancer treatment. On citalopram .   - Monitor mood symptoms and reassess after completion of cancer therapy. - Continue citalopram  as prescribed.   The patient understands the plans discussed today and is in agreement with them.  He knows to contact our office if he develops concerns prior to his next appointment.  30 minutes of total time was spent for this patient encounter, including preparation, face-to-face counseling with the patient and coordination of care, physical exam, and documentation of the encounter.    Siad Davonna, MD  [facility] [place] - A DEPT OF [facility] [address] [place] [address] Dept: [phone] Dept Fax: [phone]   No orders of the defined types were placed in this encounter.    ONCOLOGY HISTORY:   I have reviewed his chart and materials related to his cancer extensively and collaborated history with the patient. Summary of oncologic history is as follows:   Diagnosis: Stage III (T3 N0 M0 G3) poorly differentiated adenocarcinoma of distal esophagus  -01/01/2023: EGD found: Partially obstructing, large, fungating mass in the lower third of  the esophagus involving the GE  junction and 30 to 40 cm in size.  Pathology: Poorly differentiated adenocarcinoma with mucinous and signet ring cell features. -01/05/2023: CT CAP:  Substantial irregular wall thickening of the distal half of the thoracic esophagus extending over a 8.5 cm length, compatible with tumor. No findings of metastatic disease in the chest, abdomen, or pelvis. -01/22/2023: Initial PET: Circumferential wall thickening in the distal third of the thoracic esophagus, with maximum SUV 5.7, compatible with malignancy. -01/29/2023: Germline mutation testing: Negative with the VUS of APC and RET -02/02/2023: EGD/EUS: Large fungating and ulcerating mass in the distal esophagus, at the GE junction extending into the cardia, 32 to 42 cm from the incisors. Mass was partially obstructing and circumferential. Sonographic evidence suggesting invasion into the muscularis propria and adventitia. 1 enlarged lymph node in the lower paraesophageal mediastinum measuring 4 x 3 mm, which was round, hypoechoic and had well-defined margins. FNB not performed.  -[facility] NGS testing: HER2 (0), MS-stable, TMB-low, CLDN 18-negative -02/16/2023-04/09/2023: 4 cycles of neoadjuvant FLOT (ESOPEC trial) -04/23/2023: Patient evaluated by Dr. Medora rodes surgeon], who recommended esophagectomy. Patient was reluctant to undergo surgery, so proceeded with EGD with dilation and biopsy.  -05/11/2023: EGD: Severe erosion in the distal esophagus consistent with the location of the tumor. The esophagus is significantly narrowed. Biopsies taken at 36, 38, 40 cm.  -Pathology ar 36 cm and 38 cm: Atypical cells present suspicious for adenocarcinoma.  -Pathology at 40 cm: Poorly differentiated invasive mucinous adenocarcinoma with patchy signet ring cells.  -06/04/2023: Patient seen by Dr. Sheilda coons oncologist] at [facility] who recommended 4 additional cycles of FLOT and surgical reevaluation. -06/15/2023 - 07/27/2023: 4 more cycles of FLOT  completed -08/27/2023: PET: Mild thickening of distal esophagus with mild hypermetabolic activity. No evidence of hypermetabolic disease in the chest, abdomen or pelvis.  -09/29/2023: CT CAP: No measurable peritoneal or omental disease. Small volume trace free fluid versus ill-defined omental/peritoneal implants as above. Diffuse wall thickening of the gastroesophageal junction, compatible with malignancy.  Bilateral lower lung predominant consolidation and groundglass, suspicious for aspiration and atelectasis, infection could have a similar appearance. Trace bilateral pleural effusions.  -09/29/2023: Attempted esophagectomy with Dr.Saltify at [facility]  but aborted due evidence of bulky tumor at [facility], splenic, and omental metastases and metastases localized to gastric fundus and body limiting width of potential conduit. A j-tube was placed. Omental nodules were biopsied -09/29/2023: Right omentum nodule biopsy.  -Pathology of right omentum nodule 2: Mesothelial-lined fibroadipose tissue and smooth muscle with no pathologic change. Negative for malignancy. -Pathology of right omental nodule 1: Fibroadipose tissue with necrosis. Negative for malignancy. - 11/02/2023-current: ChemoRT with carboplatin  and paclitaxel . (Chose not 5-FU based regimen as patient did not have great response to 5-FU based regimen previously)  Current Treatment: Chemo RT with carboplatin  and paclitaxel   INTERVAL HISTORY:   Discussed the use of AI scribe software for clinical note transcription with the patient, who gave verbal consent to proceed.  History of Present Illness  Auron Tadros is a 68 year old male with cancer who presents for follow-up for esophageal carcinoma  He is currently undergoing treatment for cancer. After receiving fluids, he initially did not notice much improvement, but subsequently felt better.  He uses a feeding tube, which has been adjusted to a rate of seventy to avoid bloating. He uses the  feeding tube for about twelve hours a day, mostly while sleeping, and his weight has stabilized with this regimen.  He experiences numbness and tingling  in his hands and feet, which has been stable. He is on a high dose of thyroid  medication, taking two pills in the morning, and his thyroid  levels were better at the last check.  He is also taking citalopram  and feels tired and run down, attributing this to the effects of chemotherapy and radiation.   I have reviewed the past medical history, past surgical history, social history and family history with the patient and they are unchanged from previous note.  ALLERGIES:  is allergic to other, wasp venom, and zolpidem.  MEDICATIONS:  Current Outpatient Medications  Medication Sig Dispense Refill   apixaban  (ELIQUIS ) 5 MG TABS tablet Take 5 mg by mouth 2 (two) times daily.     atorvastatin  (LIPITOR) 40 MG tablet TAKE 1 TABLET BY MOUTH DAILY 90 tablet 0   blood glucose meter kit and supplies Dispense based on patient and insurance preference. Once daily testing DX E11.9 1 each 0   citalopram  (CELEXA ) 20 MG tablet TAKE 1 TABLET BY MOUTH EVERY DAY 90 tablet 3   dexamethasone  (DECADRON ) 4 MG tablet Take 2 tablets daily for 2 days, start the day after chemotherapy. Take with food. 30 tablet 1   gabapentin  (NEURONTIN ) 300 MG capsule Take 1 capsule (300 mg total) by mouth at bedtime. 30 capsule 1   glucose blood test strip Use as instructed 100 each 12   Lancets 30G MISC Once daily testing dx e11.9 100 each 5   levothyroxine  (SYNTHROID ) 112 MCG tablet TAKE 2 TABLETS BY MOUTH PRIOR TO BREAKFAST 180 tablet 0   lidocaine -prilocaine  (EMLA ) cream Apply to affected area once 30 g 3   metFORMIN  (GLUCOPHAGE ) 500 MG tablet Take 2 tablets (1,000 mg total) by mouth 2 (two) times daily with a meal. 120 tablet 3   metoprolol  tartrate (LOPRESSOR ) 25 MG tablet Take 25 mg by mouth 2 (two) times daily.     nitroGLYCERIN  (NITROSTAT ) 0.4 MG SL tablet Place 1 tablet (0.4  mg total) under the tongue every 5 (five) minutes as needed for chest pain. 50 tablet 3   olmesartan  (BENICAR ) 40 MG tablet TAKE 1 TABLET BY MOUTH EVERY DAY 90 tablet 1   omeprazole  (PRILOSEC) 40 MG capsule Take 1 capsule (40 mg total) by mouth 2 (two) times daily. Swallow granules 180 capsule 3   oxyCODONE  (ROXICODONE ) 5 MG/5ML solution Take 5 mLs (5 mg total) by mouth every 6 (six) hours as needed for severe pain (pain score 7-10). 600 mL 0   oxyCODONE  (ROXICODONE ) 5 MG/5ML solution Place 5 mLs (5 mg total) into feeding tube every 6 (six) hours as needed for severe pain (pain score 7-10). 100 mL 0   prochlorperazine  (COMPAZINE ) 10 MG tablet Take 1 tablet (10 mg total) by mouth every 6 (six) hours as needed for nausea or vomiting. 30 tablet 1   sucralfate  (CARAFATE ) 1 g tablet 1 g by Enteral route.     TYLENOL  CHILDRENS 160 MG/5ML suspension TAKE 20.3 ML BY MOUTH EVERY 6 HOURS AS NEEDED     No current facility-administered medications for this visit.    REVIEW OF SYSTEMS:   Constitutional: Denies fevers, chills or abnormal weight loss Eyes: Denies blurriness of vision Ears, nose, mouth, throat, and face: Denies mucositis or sore throat Respiratory: Denies cough, dyspnea or wheezes Cardiovascular: Denies palpitation, chest discomfort or lower extremity swelling Gastrointestinal:  Denies nausea, heartburn or change in bowel habits Skin: Denies abnormal skin rashes Lymphatics: Denies new lymphadenopathy or easy bruising Neurological:Denies numbness, tingling or new  weaknesses Behavioral/Psych: Mood is stable, no new changes  All other systems were reviewed with the patient and are negative.   VITALS:  There were no vitals taken for this visit.  Wt Readings from Last 3 Encounters:  11/23/23 207 lb 3.2 oz (94 kg)  11/09/23 212 lb 12.8 oz (96.5 kg)  11/02/23 212 lb 6.4 oz (96.3 kg)    There is no height or weight on file to calculate BMI.  Performance status (ECOG): 1 - Symptomatic but  completely ambulatory  PHYSICAL EXAM:   GENERAL:alert, no distress and comfortable SKIN: skin color, texture, turgor are normal, no rashes or significant lesions, port site clean LYMPH:  no palpable lymphadenopathy in the cervical, axillary or inguinal LUNGS: clear to auscultation and percussion with normal breathing effort HEART: regular rate & rhythm and no murmurs and no lower extremity edema ABDOMEN:abdomen soft, non-tender and normal bowel sounds, PEG tube site clean Musculoskeletal:no cyanosis of digits and no clubbing  NEURO: alert & oriented x 3 with fluent speech, no focal motor/sensory deficits  LABORATORY DATA:  I have reviewed the data as listed  Lab Results  Component Value Date   WBC 4.3 11/23/2023   NEUTROABS 3.5 11/23/2023   HGB 11.0 (L) 11/23/2023   HCT 34.0 (L) 11/23/2023   MCV 91.2 11/23/2023   PLT 199 11/23/2023     Chemistry      Component Value Date/Time   NA 136 11/23/2023 0915   NA 140 12/01/2022 1513   K 4.3 11/23/2023 0915   CL 98 11/23/2023 0915   CO2 25 11/23/2023 0915   BUN 18 11/23/2023 0915   BUN 17 12/01/2022 1513   CREATININE 0.59 (L) 11/23/2023 0915   CREATININE 1.10 10/13/2018 1054      Component Value Date/Time   CALCIUM  9.6 11/23/2023 0915   ALKPHOS 87 11/23/2023 0915   AST 10 (L) 11/23/2023 0915   ALT 11 11/23/2023 0915   BILITOT 0.3 11/23/2023 0915   BILITOT 0.3 12/01/2022 1513      Latest Reference Range & Units 11/23/23 09:15  TSH 0.350 - 4.500 uIU/mL 8.150 (H)  T4,Free(Direct) 0.61 - 1.12 ng/dL 8.74 (H)  (H): Data is abnormally high  RADIOGRAPHIC STUDIES: I have personally reviewed the radiological images as listed and agreed with the findings in the report.  None new to review

## 2023-12-02 ENCOUNTER — Encounter (INDEPENDENT_AMBULATORY_CARE_PROVIDER_SITE_OTHER): Payer: Self-pay | Admitting: Gastroenterology

## 2023-12-03 ENCOUNTER — Inpatient Hospital Stay

## 2023-12-03 VITALS — BP 136/81 | HR 61 | Temp 97.9°F | Resp 18

## 2023-12-03 DIAGNOSIS — C155 Malignant neoplasm of lower third of esophagus: Secondary | ICD-10-CM

## 2023-12-03 DIAGNOSIS — Z5111 Encounter for antineoplastic chemotherapy: Secondary | ICD-10-CM | POA: Diagnosis not present

## 2023-12-03 MED ORDER — SODIUM CHLORIDE 0.9 % IV SOLN: Freq: Once | INTRAVENOUS | Status: AC

## 2023-12-03 NOTE — Progress Notes (Signed)
 Patient tolerated therapy with no complaints voiced.  Side effects with management reviewed with understanding verbalized.  Port site clean and dry with no bruising or swelling noted at site.  Good blood return noted before and after administration of therapy.  Band aid applied.  Patient left in satisfactory condition with VSS and no s/s of distress noted.

## 2023-12-03 NOTE — Patient Instructions (Signed)
 [facility] [place] - A DEPT OF [place]  Discharge Instructions: Thank you for choosing [facility] to provide your oncology and hematology care.  If you have a lab appointment with the Cancer Center - please note that after April 8th, 2024, all labs will be drawn in the cancer center.  You do not have to check in or register with the main entrance as you have in the past but will complete your check-in in the cancer center.  Wear comfortable clothing and clothing appropriate for easy access to any Portacath or PICC line.   We strive to give you quality time with your provider. You may need to reschedule your appointment if you arrive late (15 or more minutes).  Arriving late affects you and other patients whose appointments are after yours.  Also, if you miss three or more appointments without notifying the office, you may be dismissed from the clinic at the provider's discretion.      For prescription refill requests, have your pharmacy contact our office and allow 72 hours for refills to be completed.    Today you received the following 1 L of normal saline, return as scheduled.   To help prevent nausea and vomiting after your treatment, we encourage you to take your nausea medication as directed.  BELOW ARE SYMPTOMS THAT SHOULD BE REPORTED IMMEDIATELY: *FEVER GREATER THAN 100.4 F (38 C) OR HIGHER *CHILLS OR SWEATING *NAUSEA AND VOMITING THAT IS NOT CONTROLLED WITH YOUR NAUSEA MEDICATION *UNUSUAL SHORTNESS OF BREATH *UNUSUAL BRUISING OR BLEEDING *URINARY PROBLEMS (pain or burning when urinating, or frequent urination) *BOWEL PROBLEMS (unusual diarrhea, constipation, pain near the anus) TENDERNESS IN MOUTH AND THROAT WITH OR WITHOUT PRESENCE OF ULCERS (sore throat, sores in mouth, or a toothache) UNUSUAL RASH, SWELLING OR PAIN  UNUSUAL VAGINAL DISCHARGE OR ITCHING   Items with * indicate a potential emergency and should be followed up as soon as  possible or go to the Emergency Department if any problems should occur.  Please show the CHEMOTHERAPY ALERT CARD or IMMUNOTHERAPY ALERT CARD at check-in to the Emergency Department and triage nurse.  Should you have questions after your visit or need to cancel or reschedule your appointment, please contact [facility] [place] - A DEPT OF [facility] [phone]  and follow the prompts.  Office hours are 8:00 a.m. to 4:30 p.m. Monday - Friday. Please note that voicemails left after 4:00 p.m. may not be returned until the following business day.  We are closed weekends and major holidays. You have access to a nurse at all times for urgent questions. Please call the main number to the clinic [phone] and follow the prompts.  For any non-urgent questions, you may also contact your provider using MyChart. We now offer e-Visits for anyone 18 and older to request care online for non-urgent symptoms. For details visit [email or web address].   Also download the MyChart app! Go to the app store, search MyChart, open the app, select [place], and log in with your MyChart username and password.

## 2023-12-04 NOTE — Telephone Encounter (Unsigned)
 Copied from CRM #[id number]. Topic: Clinical - Medication Refill >> Dec 04, 2023  3:55 PM Charlet HERO wrote: Medication: citalopram  (CELEXA ) 20 MG tablet  Has the patient contacted their pharmacy? Yes Patient spoke to pharm and stated that no one has replied but did not see in chart that it was faxed over. Patient is requesting 90 day supply  This is the patient's preferred pharmacy:  [facility] - [place], [place] - [address] [place] [address] Phone: [phone] Fax: [phone]  Is this the correct pharmacy for this prescription? Yes If no, delete pharmacy and type the correct one.   Has the prescription been filled recently? Yes  Is the patient out of the medication? Yes  Has the patient been seen for an appointment in the last year OR does the patient have an upcoming appointment? Yes  Can we respond through MyChart? Yes  Agent: Please be advised that Rx refills may take up to 3 business days. We ask that you follow-up with your pharmacy.

## 2023-12-07 ENCOUNTER — Inpatient Hospital Stay

## 2023-12-07 ENCOUNTER — Inpatient Hospital Stay: Admitting: Oncology

## 2023-12-07 VITALS — BP 125/82 | HR 85 | Temp 97.8°F | Resp 18

## 2023-12-07 DIAGNOSIS — G6289 Other specified polyneuropathies: Secondary | ICD-10-CM | POA: Diagnosis not present

## 2023-12-07 DIAGNOSIS — Z5111 Encounter for antineoplastic chemotherapy: Secondary | ICD-10-CM | POA: Diagnosis not present

## 2023-12-07 DIAGNOSIS — Z95828 Presence of other vascular implants and grafts: Secondary | ICD-10-CM

## 2023-12-07 DIAGNOSIS — E039 Hypothyroidism, unspecified: Secondary | ICD-10-CM

## 2023-12-07 DIAGNOSIS — C155 Malignant neoplasm of lower third of esophagus: Secondary | ICD-10-CM | POA: Diagnosis not present

## 2023-12-07 DIAGNOSIS — R1319 Other dysphagia: Secondary | ICD-10-CM

## 2023-12-07 DIAGNOSIS — F419 Anxiety disorder, unspecified: Secondary | ICD-10-CM | POA: Diagnosis not present

## 2023-12-07 LAB — COMPREHENSIVE METABOLIC PANEL WITH GFR
ALT: 9 U/L (ref 0–44)
AST: 11 U/L — ABNORMAL LOW (ref 15–41)
Albumin: 3.6 g/dL (ref 3.5–5.0)
Alkaline Phosphatase: 74 U/L (ref 38–126)
Anion gap: 9 (ref 5–15)
BUN: 17 mg/dL (ref 8–23)
CO2: 28 mmol/L (ref 22–32)
Calcium: 8.9 mg/dL (ref 8.9–10.3)
Chloride: 100 mmol/L (ref 98–111)
Creatinine, Ser: 0.49 mg/dL — ABNORMAL LOW (ref 0.61–1.24)
GFR, Estimated: >60 mL/min (ref >60)
Glucose, Bld: 147 mg/dL — ABNORMAL HIGH (ref 70–99)
Potassium: 4.3 mmol/L (ref 3.5–5.1)
Sodium: 138 mmol/L (ref 135–145)
Total Bilirubin: 0.3 mg/dL (ref 0.0–1.2)
Total Protein: 6.1 g/dL — ABNORMAL LOW (ref 6.5–8.1)

## 2023-12-07 LAB — CBC WITH DIFFERENTIAL/PLATELET
Abs Immature Granulocytes: 0.01 K/uL (ref 0.00–0.07)
Basophils Absolute: 0 K/uL (ref 0.0–0.1)
Basophils Relative: 0 %
Eosinophils Absolute: 0.1 K/uL (ref 0.0–0.5)
Eosinophils Relative: 2 %
HCT: 32.2 % — ABNORMAL LOW (ref 39.0–52.0)
Hemoglobin: 10.3 g/dL — ABNORMAL LOW (ref 13.0–17.0)
Immature Granulocytes: 0 %
Lymphocytes Relative: 6 %
Lymphs Abs: 0.1 K/uL — ABNORMAL LOW (ref 0.7–4.0)
MCH: 29.9 pg (ref 26.0–34.0)
MCHC: 32 g/dL (ref 30.0–36.0)
MCV: 93.3 fL (ref 80.0–100.0)
Monocytes Absolute: 0.2 K/uL (ref 0.1–1.0)
Monocytes Relative: 10 %
Neutro Abs: 1.9 K/uL (ref 1.7–7.7)
Neutrophils Relative %: 82 %
Platelets: 182 K/uL (ref 150–400)
RBC: 3.45 MIL/uL — ABNORMAL LOW (ref 4.22–5.81)
RDW: 14.8 % (ref 11.5–15.5)
WBC: 2.3 K/uL — ABNORMAL LOW (ref 4.0–10.5)
nRBC: 0 % (ref 0.0–0.2)

## 2023-12-07 LAB — MAGNESIUM: Magnesium: 1.5 mg/dL — ABNORMAL LOW (ref 1.7–2.4)

## 2023-12-07 MED ORDER — PALONOSETRON HCL INJECTION 0.25 MG/5ML
0.2500 mg | Freq: Once | INTRAVENOUS | Status: AC
  Administered 2023-12-07: 0.25 mg via INTRAVENOUS
  Filled 2023-12-07: qty 5

## 2023-12-07 MED ORDER — FAMOTIDINE IN NACL 20-0.9 MG/50ML-% IV SOLN
20.0000 mg | Freq: Once | INTRAVENOUS | Status: AC
  Administered 2023-12-07: 20 mg via INTRAVENOUS
  Filled 2023-12-07: qty 50

## 2023-12-07 MED ORDER — SODIUM CHLORIDE 0.9 % IV SOLN: INTRAVENOUS | Status: DC

## 2023-12-07 MED ORDER — CITALOPRAM HYDROBROMIDE 20 MG PO TABS: 20.0000 mg | ORAL_TABLET | Freq: Every day | ORAL | 3 refills | Status: AC

## 2023-12-07 MED ORDER — DEXAMETHASONE SOD PHOSPHATE PF 10 MG/ML IJ SOLN
10.0000 mg | Freq: Once | INTRAMUSCULAR | Status: AC
  Administered 2023-12-07: 10 mg via INTRAVENOUS

## 2023-12-07 MED ORDER — SODIUM CHLORIDE 0.9 % IV SOLN
50.0000 mg/m2 | Freq: Once | INTRAVENOUS | Status: AC
  Administered 2023-12-07: 108 mg via INTRAVENOUS
  Filled 2023-12-07: qty 18

## 2023-12-07 MED ORDER — SODIUM CHLORIDE 0.9 % IV SOLN
260.0000 mg | Freq: Once | INTRAVENOUS | Status: AC
  Administered 2023-12-07: 260 mg via INTRAVENOUS
  Filled 2023-12-07: qty 26

## 2023-12-07 MED ORDER — MAGNESIUM SULFATE 2 GM/50ML IV SOLN
2.0000 g | Freq: Once | INTRAVENOUS | Status: AC
  Administered 2023-12-07: 2 g via INTRAVENOUS
  Filled 2023-12-07: qty 50

## 2023-12-07 MED ORDER — CETIRIZINE HCL 10 MG/ML IV SOLN
10.0000 mg | Freq: Once | INTRAVENOUS | Status: AC
  Administered 2023-12-07: 10 mg via INTRAVENOUS
  Filled 2023-12-07: qty 1

## 2023-12-07 NOTE — Progress Notes (Signed)
 Patient Care Team: System, Provider Not In as PCP - General Mallipeddi, Samuel SQUIBB, MD as PCP - Cardiology (Cardiology) Samuel Siad, MD as Medical Oncologist (Medical Oncology) Samuel [name], RN as Oncology Nurse Navigator (Medical Oncology) Dr [facility], OD (Optometry) [name], [name], MD as Consulting Physician (Gastroenterology)  Clinic Day:  10/15/2023  Referring physician: No ref. provider found   CHIEF COMPLAINT:  CC: Stage III (T3 N0 M0 G3) poorly differentiated adenocarcinoma of distal esophagus   ASSESSMENT & PLAN:   Assessment & Plan: Samuel Hubbard  is a 68 y.o. male with stage III (T3 N0 M0 G3) poorly differentiated adenocarcinoma of distal esophagus   Assessment and Plan  Malignant neoplasm of the lower third of the esophagus  Stage III adenocarcinoma of distal esophagus S/p perioperative chemotherapy with FLOT X 8 cycles Had an attempted esophagectomy at [facility] but aborted due evidence of bulky tumor at GEJ, splenic, and omental metastases and metastases localized to gastric fundus and body limiting width of potential conduit.  Omental nodule biopsies did not show any malignancy 11/02/2023: Started chemo RT with carboplatin  and paclitaxel   -C5D1 today.  Tolerating well so far. Likely last cycle of chemotherapy today as patients last day of radiation is on 10/23.  - Labs reviewed today: CMP: Normal creatinine, normal LFTs, CBC: WBC: Normal, hemoglobin: 10.3, platelets: 182.  Physical exam stable - Physical exam stable today.  Okay to proceed with chemotherapy today. -Will monitor for worsening peripheral neuropathy - Will coordinate with Dr. [name] and the surgical team at [facility].  As per discussions with Dr. [name] and Dr. [name], surgical resection is not an option anymore. - Will obtain CT CAP 4 weeks after completing chemoRT.  If patient has good response, will continue surveillance per NCCN guidelines.   RTC in 4 weeks with scans  to discuss results and further management.  Dysphagia and weight loss secondary to cancer therapy Dysphagia and weight loss due to radiation therapy. Five-pound weight loss since starting radiation. Dysphagia likely from radiation-induced swelling.  Regained weight recently.  210 pounds today.  - Continue feeding tube nutrition as tolerated. - Follow-up with Samuel Hubbard(nutritionist) for nutritional management.  Supportive therapy Continue IV fluids twice a week for supportive care.  Will continue this until next week and then reassess after that.  Peripheral neuropathy Numbness and tingling in extremities likely related to cancer treatment.  Stable at this time  Hypothyroidism post-thyroidectomy Hypothyroidism managed with levothyroxine . Current dose is high but stable.  Taking 2 x112 mcg daily.  Thyroid  function tests improved significantly.  - Continue current thyroid  medication. - Will repeat from 6 weeks from last check - Awaiting an appointment from new primary care referral  Major depressive disorder Mood symptoms may be exacerbated by cancer treatment. On citalopram .   - Monitor mood symptoms and reassess after completion of cancer therapy. - Continue citalopram  as prescribed.  Hypomagnesemia Magnesium  1.5 today No diarrhea reported  -Replace per protocol today.   The patient understands the plans discussed today and is in agreement with them.  He knows to contact our office if he develops concerns prior to his next appointment.  20 minutes of total time was spent for this patient encounter, including preparation, face-to-face counseling with the patient and coordination of care, physical exam, and documentation of the encounter.   I, Samuel Hubbard, acting as a Neurosurgeon for [employer], MD.,have documented all relevant documentation on the behalf of Hubbard Davonna, MD,as directed by  Hubbard Davonna, MD while  in the presence of Samuel Dry, MD.  I, Samuel Dry  MD, have reviewed the above documentation for accuracy and completeness, and I agree with the above.    Samuel Dry, MD  [facility] [place] - A DEPT OF [facility] [address] [place] [address] Dept: [phone] Dept Fax: [phone]   Orders Placed This Encounter  Procedures   CT CHEST ABDOMEN PELVIS W CONTRAST    Standing Status:   Future    Expected Date:   01/07/2024    Expiration Date:   12/06/2024    If indicated for the ordered procedure, I authorize the administration of contrast media per Radiology protocol:   Yes    Does the patient have a contrast media/X-ray dye allergy?:   No    Preferred imaging location?:   [facility]    If indicated for the ordered procedure, I authorize the administration of oral contrast media per Radiology protocol:   Yes     ONCOLOGY HISTORY:   I have reviewed his chart and materials related to his cancer extensively and collaborated history with the patient. 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  Pathology: Poorly differentiated adenocarcinoma with mucinous and signet ring cell features. -01/05/2023: CT CAP:  Substantial irregular wall thickening of the distal half of the thoracic esophagus extending over a 8.5 cm length, compatible with tumor. No findings of metastatic disease in the chest, abdomen, or pelvis. -01/22/2023: Initial PET: Circumferential wall thickening in the distal third of the thoracic esophagus, with maximum SUV 5.7, compatible with malignancy. -01/29/2023: Germline mutation testing: Negative with the VUS of APC and RET -02/02/2023: EGD/EUS: Large fungating and ulcerating mass in the distal  esophagus, at the GE junction extending into the cardia, 32 to 42 cm from the incisors. Mass was partially obstructing and circumferential. Sonographic evidence suggesting invasion into the muscularis propria and adventitia. 1 enlarged lymph node in the lower paraesophageal mediastinum measuring 4 x 3 mm, which was round, hypoechoic and had well-defined margins. FNB not performed.  -[facility] NGS testing: HER2 (0), MS-stable, TMB-low, CLDN 18-negative -02/16/2023-04/09/2023: 4 cycles of neoadjuvant FLOT (ESOPEC trial) -04/23/2023: Patient evaluated by Dr. Medora rodes surgeon], who recommended esophagectomy. Patient was reluctant to undergo surgery, so proceeded with EGD with dilation and biopsy.  -05/11/2023: EGD: Severe erosion in the distal esophagus consistent with the location of the tumor. The esophagus is significantly narrowed. Biopsies taken at 36, 38, 40 cm.  -Pathology ar 36 cm and 38 cm: Atypical cells present suspicious for adenocarcinoma.  -Pathology at 40 cm: Poorly differentiated invasive mucinous adenocarcinoma with patchy signet ring cells.  -06/04/2023: Patient seen by Dr. Sheilda coons oncologist] at [facility] who recommended 4 additional cycles of FLOT and surgical reevaluation. -06/15/2023 - 07/27/2023: 4 more cycles of FLOT completed -08/27/2023: PET: Mild thickening of distal esophagus with mild hypermetabolic activity. No evidence of hypermetabolic disease in the chest, abdomen or pelvis.  -09/29/2023: CT CAP: No measurable peritoneal or omental disease. Small volume trace free fluid versus ill-defined omental/peritoneal implants as above. Diffuse wall thickening of the gastroesophageal junction, compatible with malignancy.  Bilateral lower lung predominant consolidation and groundglass, suspicious for aspiration and atelectasis, infection could have a  similar appearance. Trace bilateral pleural effusions.  -09/29/2023: Attempted esophagectomy with Dr.Salfity at [facility]  but aborted due  evidence of bulky tumor at [facility], splenic, and omental metastases and metastases localized to gastric fundus and body limiting width of potential conduit. A j-tube was placed. Omental nodules were biopsied -09/29/2023: Right omentum nodule biopsy.  -Pathology of right omentum nodule 2: Mesothelial-lined fibroadipose tissue and smooth muscle with no pathologic change. Negative for malignancy. -Pathology of right omental nodule 1: Fibroadipose tissue with necrosis. Negative for malignancy. - 11/02/2023-current: ChemoRT with carboplatin  and paclitaxel . (Chose not 5-FU based regimen as patient did not have great response to 5-FU based regimen previously)  Current Treatment: Chemo RT with carboplatin  and paclitaxel   INTERVAL HISTORY:   Samuel Hubbard is returning for a follow up of his esophageal cancer. He is accompanied by his wife today.  Samuel Hubbard is feeling well overall, but is tired today.   Samuel Hubbard states that the numbness and tingling is not getting worse and is stable.  He has had an intentional weight gain of 4 pounds while on tube feeding.  I have reviewed the past medical history, past surgical history, social history and family history with the patient and they are unchanged from previous note.  ALLERGIES:  is allergic to other, wasp venom, and zolpidem.  MEDICATIONS:  Current Outpatient Medications  Medication Sig Dispense Refill   apixaban  (ELIQUIS ) 5 MG TABS tablet Take 5 mg by mouth 2 (two) times daily.     atorvastatin  (LIPITOR) 40 MG tablet TAKE 1 TABLET BY MOUTH DAILY 90 tablet 0   blood glucose meter kit and supplies Dispense based on patient and insurance preference. Once daily testing DX E11.9 1 each 0   citalopram  (CELEXA ) 20 MG tablet Take 1 tablet (20 mg total) by mouth daily. 90 tablet 3   dexamethasone  (DECADRON ) 4 MG tablet Take 2 tablets daily for 2 days, start the day after chemotherapy. Take with food. 30 tablet 1   gabapentin  (NEURONTIN ) 300 MG capsule Take 1  capsule (300 mg total) by mouth at bedtime. 30 capsule 1   glucose blood test strip Use as instructed 100 each 12   Lancets 30G MISC Once daily testing dx e11.9 100 each 5   levothyroxine  (SYNTHROID ) 112 MCG tablet TAKE 2 TABLETS BY MOUTH PRIOR TO BREAKFAST 180 tablet 0   lidocaine -prilocaine  (EMLA ) cream Apply to affected area once 30 g 3   metFORMIN  (GLUCOPHAGE ) 500 MG tablet Take 2 tablets (1,000 mg total) by mouth 2 (two) times daily with a meal. 120 tablet 3   metoprolol  tartrate (LOPRESSOR ) 25 MG tablet Take 25 mg by mouth 2 (two) times daily.     nitroGLYCERIN  (NITROSTAT ) 0.4 MG SL tablet Place 1 tablet (0.4 mg total) under the tongue every 5 (five) minutes as needed for chest pain. 50 tablet 3   olmesartan  (BENICAR ) 40 MG tablet TAKE 1 TABLET BY MOUTH EVERY DAY 90 tablet 1   omeprazole  (PRILOSEC) 40 MG capsule Take 1 capsule (40 mg total) by mouth 2 (two) times daily. Swallow granules 180 capsule 3   prochlorperazine  (COMPAZINE ) 10 MG tablet TAKE 1 TABLET BY MOUTH EVERY 6 HOURS AS NEEDED FOR NAUSEA AND VOMITING 30 tablet 1   sucralfate  (CARAFATE ) 1 g tablet 1 g by Enteral route.     TYLENOL  CHILDRENS 160 MG/5ML suspension TAKE 20.3 ML BY MOUTH EVERY 6 HOURS AS NEEDED     No current facility-administered medications for this visit.   Facility-Administered Medications Ordered in Other  Visits  Medication Dose Route Frequency Provider Last Rate Last Admin   0.9 %  sodium chloride  infusion   Intravenous Continuous Samuel Siad, MD 10 mL/hr at 12/07/23 1022 New Bag at 12/07/23 1022   CARBOplatin  (PARAPLATIN ) 260 mg in sodium chloride  0.9 % 100 mL chemo infusion  260 mg Intravenous Once Mikyah Alamo, MD       cetirizine  (QUZYTTIR ) injection 10 mg  10 mg Intravenous Once Shalika Arntz, MD       dexamethasone  (DECADRON ) injection 10 mg  10 mg Intravenous Once Hadlie Gipson, MD       famotidine  (PEPCID ) IVPB 20 mg premix  20 mg Intravenous Once Elner Seifert, MD       magnesium   sulfate IVPB 2 g 50 mL  2 g Intravenous Once Haeli Gerlich, MD       PACLitaxel  (TAXOL ) 108 mg in sodium chloride  0.9 % 250 mL chemo infusion (</= 80mg /m2)  50 mg/m2 (Treatment Plan Recorded) Intravenous Once Samuel Siad, MD        REVIEW OF SYSTEMS:   Constitutional: Denies fevers, chills or abnormal weight loss Eyes: Denies blurriness of vision Ears, nose, mouth, throat, and face: Denies mucositis or sore throat Respiratory: Denies cough, dyspnea or wheezes Cardiovascular: Denies palpitation, chest discomfort or lower extremity swelling Gastrointestinal:  Denies nausea, heartburn or change in bowel habits Skin: Denies abnormal skin rashes Lymphatics: Denies new lymphadenopathy or easy bruising Neurological:Denies numbness, tingling or new weaknesses Behavioral/Psych: Mood is stable, no new changes  All other systems were reviewed with the patient and are negative.   VITALS:  There were no vitals taken for this visit.  Wt Readings from Last 3 Encounters:  12/07/23 210 lb (95.3 kg)  11/30/23 206 lb 9.1 oz (93.7 kg)  11/23/23 207 lb 3.2 oz (94 kg)    There is no height or weight on file to calculate BMI.  Performance status (ECOG): 1 - Symptomatic but completely ambulatory  PHYSICAL EXAM:   GENERAL:alert, no distress and comfortable SKIN: skin color, texture, turgor are normal, no rashes or significant lesions, port site clean LYMPH:  no palpable lymphadenopathy in the cervical, axillary or inguinal LUNGS: clear to auscultation and percussion with normal breathing effort HEART: regular rate & rhythm and no murmurs and no lower extremity edema ABDOMEN:abdomen soft, non-tender and normal bowel sounds, PEG tube site clean Musculoskeletal:no cyanosis of digits and no clubbing  NEURO: alert & oriented x 3 with fluent speech, no focal motor/sensory deficits  LABORATORY DATA:  I have reviewed the data as listed  Lab Results  Component Value Date   WBC 2.3 (L) 12/07/2023    NEUTROABS 1.9 12/07/2023   HGB 10.3 (L) 12/07/2023   HCT 32.2 (L) 12/07/2023   MCV 93.3 12/07/2023   PLT 182 12/07/2023     Chemistry      Component Value Date/Time   NA 138 12/07/2023 0902   NA 140 12/01/2022 1513   K 4.3 12/07/2023 0902   CL 100 12/07/2023 0902   CO2 28 12/07/2023 0902   BUN 17 12/07/2023 0902   BUN 17 12/01/2022 1513   CREATININE 0.49 (L) 12/07/2023 0902   CREATININE 1.10 10/13/2018 1054      Component Value Date/Time   CALCIUM  8.9 12/07/2023 0902   ALKPHOS 74 12/07/2023 0902   AST 11 (L) 12/07/2023 0902   ALT 9 12/07/2023 0902   BILITOT 0.3 12/07/2023 0902   BILITOT 0.3 12/01/2022 1513      Latest Reference Range & Units  11/23/23 09:15  TSH 0.350 - 4.500 uIU/mL 8.150 (H)  T4,Free(Direct) 0.61 - 1.12 ng/dL 8.74 (H)  (H): Data is abnormally high  RADIOGRAPHIC STUDIES: I have personally reviewed the radiological images as listed and agreed with the findings in the report.  None new to review

## 2023-12-07 NOTE — Patient Instructions (Signed)

## 2023-12-07 NOTE — Progress Notes (Signed)
 Patient has been examined by Dr. Davonna. Vital signs and labs have been reviewed by MD - ANC, Creatinine, LFTs, hemoglobin, and platelets have been reviewed by M.D. - pt may proceed with treatment.  Primary RN and pharmacy notified.

## 2023-12-07 NOTE — Progress Notes (Signed)

## 2023-12-07 NOTE — Patient Instructions (Signed)
 [facility] [place] - A DEPT OF [facility]  Discharge Instructions: Thank you for choosing [facility] to provide your oncology and hematology care.  If you have a lab appointment with the Cancer Center - please note that after April 8th, 2024, all labs will be drawn in the cancer center.  You do not have to check in or register with the main entrance as you have in the past but will complete your check-in in the cancer center.  Wear comfortable clothing and clothing appropriate for easy access to any Portacath or PICC line.   We strive to give you quality time with your provider. You may need to reschedule your appointment if you arrive late (15 or more minutes).  Arriving late affects you and other patients whose appointments are after yours.  Also, if you miss three or more appointments without notifying the office, you may be dismissed from the clinic at the provider's discretion.      For prescription refill requests, have your pharmacy contact our office and allow 72 hours for refills to be completed.    Today you received the following chemotherapy and/or immunotherapy agents taxol and carboplatin      To help prevent nausea and vomiting after your treatment, we encourage you to take your nausea medication as directed.  BELOW ARE SYMPTOMS THAT SHOULD BE REPORTED IMMEDIATELY: *FEVER GREATER THAN 100.4 F (38 C) OR HIGHER *CHILLS OR SWEATING *NAUSEA AND VOMITING THAT IS NOT CONTROLLED WITH YOUR NAUSEA MEDICATION *UNUSUAL SHORTNESS OF BREATH *UNUSUAL BRUISING OR BLEEDING *URINARY PROBLEMS (pain or burning when urinating, or frequent urination) *BOWEL PROBLEMS (unusual diarrhea, constipation, pain near the anus) TENDERNESS IN MOUTH AND THROAT WITH OR WITHOUT PRESENCE OF ULCERS (sore throat, sores in mouth, or a toothache) UNUSUAL RASH, SWELLING OR PAIN  UNUSUAL VAGINAL DISCHARGE OR ITCHING   Items with * indicate a potential emergency and should be  followed up as soon as possible or go to the Emergency Department if any problems should occur.  Please show the CHEMOTHERAPY ALERT CARD or IMMUNOTHERAPY ALERT CARD at check-in to the Emergency Department and triage nurse.  Should you have questions after your visit or need to cancel or reschedule your appointment, please contact [facility] [place] - A DEPT OF Eligha Bridegroom [facility] [phone]  and follow the prompts.  Office hours are 8:00 a.m. to 4:30 p.m. Monday - Friday. Please note that voicemails left after 4:00 p.m. may not be returned until the following business day.  We are closed weekends and major holidays. You have access to a nurse at all times for urgent questions. Please call the main number to the clinic [phone] and follow the prompts.  For any non-urgent questions, you may also contact your provider using MyChart. We now offer e-Visits for anyone 45 and older to request care online for non-urgent symptoms. For details visit [email or web address].   Also download the MyChart app! Go to the app store, search "MyChart", open the app, select [place], and log in with your MyChart username and password.

## 2023-12-10 ENCOUNTER — Inpatient Hospital Stay

## 2023-12-10 VITALS — BP 134/87 | HR 84 | Temp 98.0°F | Resp 16

## 2023-12-10 DIAGNOSIS — C155 Malignant neoplasm of lower third of esophagus: Secondary | ICD-10-CM

## 2023-12-10 DIAGNOSIS — Z5111 Encounter for antineoplastic chemotherapy: Secondary | ICD-10-CM | POA: Diagnosis not present

## 2023-12-10 MED ORDER — ONDANSETRON HCL 4 MG/2ML IJ SOLN
8.0000 mg | Freq: Once | INTRAMUSCULAR | Status: AC
  Administered 2023-12-10: 8 mg via INTRAVENOUS
  Filled 2023-12-10: qty 4

## 2023-12-10 MED ORDER — SODIUM CHLORIDE 0.9 % IV SOLN: Freq: Once | INTRAVENOUS | Status: AC

## 2023-12-10 NOTE — Patient Instructions (Signed)
 [facility] [place] - A DEPT OF [facility]  Discharge Instructions: Thank you for choosing [facility] to provide your oncology and hematology care.  If you have a lab appointment with the Cancer Center - please note that after April 8th, 2024, all labs will be drawn in the cancer center.  You do not have to check in or register with the main entrance as you have in the past but will complete your check-in in the cancer center.  Wear comfortable clothing and clothing appropriate for easy access to any Portacath or PICC line.   We strive to give you quality time with your provider. You may need to reschedule your appointment if you arrive late (15 or more minutes).  Arriving late affects you and other patients whose appointments are after yours.  Also, if you miss three or more appointments without notifying the office, you may be dismissed from the clinic at the provider's discretion.      For prescription refill requests, have your pharmacy contact our office and allow 72 hours for refills to be completed.    Today you received 1 Liter of NS over 2 hours.      BELOW ARE SYMPTOMS THAT SHOULD BE REPORTED IMMEDIATELY: *FEVER GREATER THAN 100.4 F (38 C) OR HIGHER *CHILLS OR SWEATING *NAUSEA AND VOMITING THAT IS NOT CONTROLLED WITH YOUR NAUSEA MEDICATION *UNUSUAL SHORTNESS OF BREATH *UNUSUAL BRUISING OR BLEEDING *URINARY PROBLEMS (pain or burning when urinating, or frequent urination) *BOWEL PROBLEMS (unusual diarrhea, constipation, pain near the anus) TENDERNESS IN MOUTH AND THROAT WITH OR WITHOUT PRESENCE OF ULCERS (sore throat, sores in mouth, or a toothache) UNUSUAL RASH, SWELLING OR PAIN  UNUSUAL VAGINAL DISCHARGE OR ITCHING   Items with * indicate a potential emergency and should be followed up as soon as possible or go to the Emergency Department if any problems should occur.  Please show the CHEMOTHERAPY ALERT CARD or IMMUNOTHERAPY ALERT CARD  at check-in to the Emergency Department and triage nurse.  Should you have questions after your visit or need to cancel or reschedule your appointment, please contact [facility] [place] - A DEPT OF [facility] [phone]  and follow the prompts.  Office hours are 8:00 a.m. to 4:30 p.m. Monday - Friday. Please note that voicemails left after 4:00 p.m. may not be returned until the following business day.  We are closed weekends and major holidays. You have access to a nurse at all times for urgent questions. Please call the main number to the clinic [phone] and follow the prompts.  For any non-urgent questions, you may also contact your provider using MyChart. We now offer e-Visits for anyone 36 and older to request care online for non-urgent symptoms. For details visit [email or web address].   Also download the MyChart app! Go to the app store, search MyChart, open the app, select [place], and log in with your MyChart username and password.

## 2023-12-10 NOTE — Progress Notes (Signed)
 Patient presents today for 1 Liter of NS over 2 hours per provider's orders. Vital signs stable and patient voiced he was slightly nauseous and Zofran  was given.  1 Liter of NS given today per MD orders. Tolerated infusion without adverse affects. Vital signs stable. No complaints at this time. Discharged from clinic ambulatory in stable condition. Alert and oriented x 3. F/U with [facility] as scheduled.

## 2023-12-14 ENCOUNTER — Telehealth: Payer: Self-pay | Admitting: Dietician

## 2023-12-14 ENCOUNTER — Inpatient Hospital Stay: Admitting: Dietician

## 2023-12-14 NOTE — Telephone Encounter (Signed)
 Nutrition Follow-up:  Patient with malignant neoplasm of lower third esophagus. He has completed neoadjuvant FLOT q14d x 8 cycles. Esophagectomy at [facility] (8/12) aborted due to concerns for gastric tumor. S/p Jtube. Pathology negative for cancer. Patient is now receiving concurrent chemoradiation with weekly carboplatin /paclitaxel  (start 9/15). Final chemo 10/20, last RT 10/23. Patient is under the care of Dr. Davonna Sermon with patient via telephone. He reports fatigue. Says he slept almost the entire day yesterday. Patient endorses weakness/deconditioning. His shoulders/neck ache when standing upright for any extended period of time. Patient tolerating some water orally. He continues to rely on Jtube for nutrition. Reports Nutren 1.5 @ 70 ml/hr x 12-14 hours. Patient has bloating with increased rate. He is planning to try Boost VHC orally. Patient asking when he will start feeling better.    Medications: reviewed   Labs: 10/20 glucose 147, Cr 0.49, Mg 1.5, Hgb 10.3  Anthropometrics: Wt 210 lb on 10/20 increased   10/13 - 206 lb 9.1 oz  10/6 - 207 lb 3.2 oz 9/22 - 212 lb 12.8 oz    NUTRITION DIAGNOSIS: Food and nutrition related knowledge deficit improved   INTERVENTION:  Encourage po of liquids as tolerated - recommend SLP evaluation to assess swallowing function s/p concurrent chemoRT Encourage daily Boost VHC po as tolerated for added calories/protein  Continue Jtube feedings - encourage increasing Nutren 1.5 @70  ml/hr x 16-18 hrs to better meet nutrition needs  Continue supportive care with IVF as indicated per MD  Recommend PT evaluation    MONITORING, EVALUATION, GOAL: wt trends, TF, intake   NEXT VISIT: Monday November 10 via telephone

## 2023-12-15 ENCOUNTER — Encounter: Payer: Self-pay | Admitting: *Deleted

## 2023-12-15 ENCOUNTER — Other Ambulatory Visit: Payer: Self-pay | Admitting: *Deleted

## 2023-12-15 DIAGNOSIS — C155 Malignant neoplasm of lower third of esophagus: Secondary | ICD-10-CM

## 2023-12-15 DIAGNOSIS — R531 Weakness: Secondary | ICD-10-CM

## 2023-12-15 DIAGNOSIS — R1319 Other dysphagia: Secondary | ICD-10-CM

## 2023-12-15 NOTE — Progress Notes (Signed)
 Referral placed for PT and ST with [facility].  Per Jason Hinton, should be able to see patient tomorrow.

## 2023-12-17 ENCOUNTER — Other Ambulatory Visit: Payer: Self-pay | Admitting: Oncology

## 2023-12-17 ENCOUNTER — Inpatient Hospital Stay

## 2023-12-17 VITALS — BP 118/71 | HR 76 | Resp 17

## 2023-12-17 VITALS — BP 122/74 | HR 82 | Temp 97.7°F | Resp 18

## 2023-12-17 DIAGNOSIS — Z95828 Presence of other vascular implants and grafts: Secondary | ICD-10-CM

## 2023-12-17 DIAGNOSIS — C155 Malignant neoplasm of lower third of esophagus: Secondary | ICD-10-CM

## 2023-12-17 DIAGNOSIS — Z5111 Encounter for antineoplastic chemotherapy: Secondary | ICD-10-CM | POA: Diagnosis not present

## 2023-12-17 LAB — CBC WITH DIFFERENTIAL/PLATELET
Abs Immature Granulocytes: 0.01 K/uL (ref 0.00–0.07)
Basophils Absolute: 0 K/uL (ref 0.0–0.1)
Basophils Relative: 1 %
Eosinophils Absolute: 0 K/uL (ref 0.0–0.5)
Eosinophils Relative: 1 %
HCT: 33.8 % — ABNORMAL LOW (ref 39.0–52.0)
Hemoglobin: 10.6 g/dL — ABNORMAL LOW (ref 13.0–17.0)
Immature Granulocytes: 1 %
Lymphocytes Relative: 6 %
Lymphs Abs: 0.1 K/uL — ABNORMAL LOW (ref 0.7–4.0)
MCH: 29.4 pg (ref 26.0–34.0)
MCHC: 31.4 g/dL (ref 30.0–36.0)
MCV: 93.6 fL (ref 80.0–100.0)
Monocytes Absolute: 0.5 K/uL (ref 0.1–1.0)
Monocytes Relative: 22 %
Neutro Abs: 1.5 K/uL — ABNORMAL LOW (ref 1.7–7.7)
Neutrophils Relative %: 69 %
Platelets: 178 K/uL (ref 150–400)
RBC: 3.61 MIL/uL — ABNORMAL LOW (ref 4.22–5.81)
RDW: 16.3 % — ABNORMAL HIGH (ref 11.5–15.5)
WBC: 2.2 K/uL — ABNORMAL LOW (ref 4.0–10.5)
nRBC: 0 % (ref 0.0–0.2)

## 2023-12-17 LAB — COMPREHENSIVE METABOLIC PANEL WITH GFR
ALT: 12 U/L (ref 0–44)
AST: 13 U/L — ABNORMAL LOW (ref 15–41)
Albumin: 3.7 g/dL (ref 3.5–5.0)
Alkaline Phosphatase: 80 U/L (ref 38–126)
Anion gap: 11 (ref 5–15)
BUN: 20 mg/dL (ref 8–23)
CO2: 29 mmol/L (ref 22–32)
Calcium: 9 mg/dL (ref 8.9–10.3)
Chloride: 98 mmol/L (ref 98–111)
Creatinine, Ser: 0.47 mg/dL — ABNORMAL LOW (ref 0.61–1.24)
GFR, Estimated: >60 mL/min (ref >60)
Glucose, Bld: 135 mg/dL — ABNORMAL HIGH (ref 70–99)
Potassium: 3.7 mmol/L (ref 3.5–5.1)
Sodium: 138 mmol/L (ref 135–145)
Total Bilirubin: 0.4 mg/dL (ref 0.0–1.2)
Total Protein: 6.4 g/dL — ABNORMAL LOW (ref 6.5–8.1)

## 2023-12-17 LAB — MAGNESIUM: Magnesium: 1.4 mg/dL — ABNORMAL LOW (ref 1.7–2.4)

## 2023-12-17 MED ORDER — ONDANSETRON HCL 4 MG/2ML IJ SOLN
8.0000 mg | Freq: Once | INTRAMUSCULAR | Status: AC
  Administered 2023-12-17: 8 mg via INTRAVENOUS
  Filled 2023-12-17: qty 4

## 2023-12-17 MED ORDER — MAGNESIUM SULFATE 2 GM/50ML IV SOLN: 2.0000 g | Freq: Once | INTRAVENOUS | Status: DC

## 2023-12-17 MED ORDER — MAGNESIUM SULFATE 4 GM/100ML IV SOLN
4.0000 g | Freq: Once | INTRAVENOUS | Status: AC
  Administered 2023-12-17: 4 g via INTRAVENOUS
  Filled 2023-12-17: qty 100

## 2023-12-17 MED ORDER — SODIUM CHLORIDE 0.9 % IV SOLN: Freq: Once | INTRAVENOUS | Status: AC

## 2023-12-17 MED ORDER — MAGNESIUM OXIDE -MG SUPPLEMENT 400 (240 MG) MG PO TABS: 400.0000 mg | ORAL_TABLET | Freq: Every day | ORAL | 2 refills | Status: DC

## 2023-12-17 NOTE — Progress Notes (Signed)
 Patient presents today for 1 Liter of NS over 2 hours. Per pt he is feeling nauseous. Standing orders to give zofran  as needed. Magnesium  1.4. Mag 4g IV given per standing orders. MD notified of Magnesium  levels. Prescription sent to patient's pharmacy. Pt updated and all questions answered at this time.   Patient tolerated hydration with no complaints voiced.  Port site clean and dry with good blood return noted before and after hydration.  No bruising or swelling noted with port.  Band aid applied.  VSS with discharge and left ambulatory with no s/s of distress noted.  All follow ups as scheduled.   Prospero Mahnke

## 2023-12-21 ENCOUNTER — Encounter: Payer: Self-pay | Admitting: Radiology

## 2023-12-28 ENCOUNTER — Telehealth: Payer: Self-pay | Admitting: Dietician

## 2023-12-28 ENCOUNTER — Inpatient Hospital Stay: Attending: Hematology | Admitting: Dietician

## 2023-12-28 DIAGNOSIS — I700 Atherosclerosis of aorta: Secondary | ICD-10-CM | POA: Insufficient documentation

## 2023-12-28 DIAGNOSIS — C155 Malignant neoplasm of lower third of esophagus: Secondary | ICD-10-CM | POA: Insufficient documentation

## 2023-12-28 DIAGNOSIS — R918 Other nonspecific abnormal finding of lung field: Secondary | ICD-10-CM | POA: Insufficient documentation

## 2023-12-28 NOTE — Telephone Encounter (Signed)
 Nutrition Follow-up:  Patient with malignant neoplasm of lower third esophagus. He has completed neoadjuvant FLOT q14d x 8 cycles. Esophagectomy at [facility] (8/12) aborted due to concerns for gastric tumor. S/p Jtube. Pathology negative for cancer. Patient is now receiving concurrent chemoradiation with weekly carboplatin /paclitaxel  (start 9/15). Final chemo 10/20, last RT 10/23. Patient is under the care of Dr. Davonna Sermon with patient via telephone for nutrition follow-up. Wife present via speaker phone. Patient reports persistent fatigue and weakness. He has been sleeping more. Patient is s/p SLP assessment, planning to begin [facility] ST. Patient reports increased odynophagia after assessment. He tried eating half crab cake last night. Patient did not tolerate, endorsing multiple episodes of vomiting (similar feeling with prior food impaction). This has resolved. Thinking of trying small amount of yogurt this evening.   Patient relying on tube. Tolerating 4 cartons Nutren 1.5 via Jtube overnight. Currently not consuming Boost VHC as this is too thick. Patient reports less po water after SLP assessment.   Patient expressed frustration regarding establishing care with PCP.   Medications: reviewed   Labs: 10/30 - Mg 1.4, Cr 0.47, glucose 135  Anthropometrics: No new wt. Pt 210 lb on 10/20  Pt reports wts stable ~200 lb on home scale    NUTRITION DIAGNOSIS: Food and nutrition related knowledge deficit improved   INTERVENTION:  Pt starting HH ST - explained rationale for therapy and encourage following HEP as prescribed Suggest po trials of soft smooth textures as tolerated  Continue minimum 4 cartons Nutren 1.5 via Jtube - encourage increasing to 6 cartons (goal) as tolerated  Informed pt that referral sent 10/6 to [facility] (Dr. Bluford). RD provided office contact number - pt appreciative   MONITORING, EVALUATION, GOAL: wt trends, intake   NEXT VISIT: Monday December 1 via  telephone

## 2024-01-06 NOTE — H&P (Signed)
 Surgical History & Physical  Patient Name: Samuel Hubbard  DOB: 1955-08-02  Surgery: Cataract extraction with intraocular lens implant phacoemulsification; Right Eye Surgeon: Lynwood Hermann MD Surgery Date: 01/11/2024 Pre-Op Date: 12/21/2023  HPI: A 81 Yr. old male patient 1. The patient is a new patient here for a Cataract Evaluation. The patient complains of difficulty when reading fine print, books, newspaper, instructions etc., which began 2 years ago. Both eyes are affected. The episode is gradual. The patient describes foggy and hazy symptoms affecting their eyes/vision. This is negatively affecting the patient's quality of life and the patient is unable to function adequately in life with the current level of vision. HPI Completed by Dr. Lynwood Hermann  Medical History: Cataracts eye cancer os  Review of Systems  Negative Allergic/Immunologic Negative Cardiovascular Negative Constitutional Negative Ear, Nose, Mouth & Throat Negative Endocrine Melanoma,Lt Eyes Negative Gastrointestinal Negative Genitourinary Negative Hematologic/Lymphatic Negative Integumentary Negative Musculoskeletal Negative Neurological Negative Psychiatry Negative Respiratory  Social Never smoked  Medication Prednisolone-moxiflox-bromfen, Sucralfate , Atorvastatin , Milk of Magnesia, Prochlorperazine  maleate, Omeprazole , Oxycodone , Citalopram , Lidocaine -prilocaine , Levothyroxine , Ondansetron , Gabapentin , Dexamethasone , Olmesartan , Eliquis , Metoprolol  tartrate, Tylenol  Chl Susp Chry D F 4oz, Sucralfate   Sx/Procedures 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 NUCLEAR SCLEROSIS AGE RELATED; Both Eyes (H25.13) 2. BLEPHARITIS; Right Upper Lid, Right Lower Lid, Left Upper Lid,  Left Lower Lid (H01.001, H01.002,H01.004,H01.005) 3. CHOROID MALIGNANT NEOPLASM/MELANOMA; Left Eye (C69.32) 4. BLINDNESS LEFT EYE CATEGORY 5, NORMAL VISION RIGHT EYE (H54.42A5)  Plan: 1. Cataract accounts for the patient's decreased vision. This visual impairment is not correctable with a tolerable change in glasses or contact lenses. Cataract surgery with an implantation of a new lens should significantly improve the visual and functional status of the patient. Recommend phacoemulsification with intraocular lens. Discussed all risks, benefits, alternatives, and potential complications. Discussed the procedures and recovery. The patient desires to have surgery. A-scan/Biometry ordered and will be performed for intraocular lens calculations. The surgery will be performed in order to improve vision for driving, reading, and for eye examinations. Recommend Dextenza  for post-operative pain and inflammation. Educational materials provided: Cataract. History of corneal refractive Surgery: None History of Previous Ocular Surgery (PPV, other): None History of ocular trauma: None Use of Eye Pressure Lowering Drops: None Pupil Status: Dilates well - shugarcaine or Lidocaine +Omidira by protocol Pupil Status: Dilates poorly - shugarcaine or Lidocaine +Omidira by protocol, Malyugin Ring Multifocal Candidate: Right Eye first. Refractive Goal: [place] Standard Lens: DIB00  2. Blepharitis is present - recommend regular lid cleaning.  3. Several years ago. Treated with plaque bachytherapy. Will review records for further recommendations.  4. Monocular precautions discussed, including wearing shatterproof lenses.

## 2024-01-07 ENCOUNTER — Encounter (HOSPITAL_COMMUNITY): Payer: Self-pay

## 2024-01-07 ENCOUNTER — Other Ambulatory Visit: Payer: Self-pay

## 2024-01-07 ENCOUNTER — Encounter (HOSPITAL_COMMUNITY)
Admission: RE | Admit: 2024-01-07 | Discharge: 2024-01-07 | Disposition: A | Discharge status: Home / Self Care | Source: Ambulatory Visit | Attending: Ophthalmology | Admitting: Ophthalmology

## 2024-01-08 ENCOUNTER — Inpatient Hospital Stay

## 2024-01-08 ENCOUNTER — Ambulatory Visit (HOSPITAL_COMMUNITY)
Admission: RE | Admit: 2024-01-08 | Discharge: 2024-01-08 | Disposition: A | Discharge status: Home / Self Care | Source: Ambulatory Visit | Attending: Oncology | Admitting: Oncology

## 2024-01-08 DIAGNOSIS — Z95828 Presence of other vascular implants and grafts: Secondary | ICD-10-CM

## 2024-01-08 DIAGNOSIS — R918 Other nonspecific abnormal finding of lung field: Secondary | ICD-10-CM | POA: Diagnosis not present

## 2024-01-08 DIAGNOSIS — I700 Atherosclerosis of aorta: Secondary | ICD-10-CM | POA: Diagnosis not present

## 2024-01-08 DIAGNOSIS — C155 Malignant neoplasm of lower third of esophagus: Secondary | ICD-10-CM | POA: Diagnosis present

## 2024-01-08 LAB — COMPREHENSIVE METABOLIC PANEL WITH GFR
ALT: 28 U/L (ref 0–44)
AST: 20 U/L (ref 15–41)
Albumin: 3.5 g/dL (ref 3.5–5.0)
Alkaline Phosphatase: 84 U/L (ref 38–126)
Anion gap: 9 (ref 5–15)
BUN: 24 mg/dL — ABNORMAL HIGH (ref 8–23)
CO2: 30 mmol/L (ref 22–32)
Calcium: 9.2 mg/dL (ref 8.9–10.3)
Chloride: 98 mmol/L (ref 98–111)
Creatinine, Ser: 0.45 mg/dL — ABNORMAL LOW (ref 0.61–1.24)
GFR, Estimated: >60 mL/min (ref >60)
Glucose, Bld: 168 mg/dL — ABNORMAL HIGH (ref 70–99)
Potassium: 4.4 mmol/L (ref 3.5–5.1)
Sodium: 137 mmol/L (ref 135–145)
Total Bilirubin: 0.3 mg/dL (ref 0.0–1.2)
Total Protein: 6.5 g/dL (ref 6.5–8.1)

## 2024-01-08 LAB — CBC WITH DIFFERENTIAL/PLATELET
Abs Immature Granulocytes: 0.01 K/uL (ref 0.00–0.07)
Basophils Absolute: 0 K/uL (ref 0.0–0.1)
Basophils Relative: 0 %
Eosinophils Absolute: 0.1 K/uL (ref 0.0–0.5)
Eosinophils Relative: 5 %
HCT: 30.8 % — ABNORMAL LOW (ref 39.0–52.0)
Hemoglobin: 9.7 g/dL — ABNORMAL LOW (ref 13.0–17.0)
Immature Granulocytes: 0 %
Lymphocytes Relative: 9 %
Lymphs Abs: 0.2 K/uL — ABNORMAL LOW (ref 0.7–4.0)
MCH: 29.6 pg (ref 26.0–34.0)
MCHC: 31.5 g/dL (ref 30.0–36.0)
MCV: 93.9 fL (ref 80.0–100.0)
Monocytes Absolute: 0.5 K/uL (ref 0.1–1.0)
Monocytes Relative: 21 %
Neutro Abs: 1.6 K/uL — ABNORMAL LOW (ref 1.7–7.7)
Neutrophils Relative %: 65 %
Platelets: 205 K/uL (ref 150–400)
RBC: 3.28 MIL/uL — ABNORMAL LOW (ref 4.22–5.81)
RDW: 16 % — ABNORMAL HIGH (ref 11.5–15.5)
WBC: 2.6 K/uL — ABNORMAL LOW (ref 4.0–10.5)
nRBC: 0 % (ref 0.0–0.2)

## 2024-01-08 LAB — MAGNESIUM: Magnesium: 1.8 mg/dL (ref 1.7–2.4)

## 2024-01-08 MED ORDER — HEPARIN SOD (PORK) LOCK FLUSH 100 UNIT/ML IV SOLN
500.0000 [IU] | Freq: Once | INTRAVENOUS | Status: AC
  Administered 2024-01-08: 500 [IU] via INTRAVENOUS

## 2024-01-08 MED ORDER — IOHEXOL 300 MG/ML  SOLN
100.0000 mL | Freq: Once | INTRAMUSCULAR | Status: AC | PRN
  Administered 2024-01-08: 100 mL via INTRAVENOUS

## 2024-01-08 NOTE — Progress Notes (Signed)
 Patients port flushed without difficulty.  Good blood return noted with no bruising or swelling noted at site.  Patient left accessed for CT.

## 2024-01-11 ENCOUNTER — Other Ambulatory Visit: Payer: Self-pay | Admitting: Internal Medicine

## 2024-01-11 ENCOUNTER — Encounter (HOSPITAL_COMMUNITY): Payer: Self-pay | Admitting: Ophthalmology

## 2024-01-11 ENCOUNTER — Ambulatory Visit (HOSPITAL_COMMUNITY): Payer: Self-pay | Admitting: Anesthesiology

## 2024-01-11 ENCOUNTER — Telehealth: Payer: Self-pay

## 2024-01-11 ENCOUNTER — Encounter (HOSPITAL_COMMUNITY)
Admission: RE | Disposition: A | Discharge status: Home / Self Care | Payer: Self-pay | Source: Home / Self Care | Attending: Ophthalmology

## 2024-01-11 ENCOUNTER — Ambulatory Visit (HOSPITAL_COMMUNITY)
Admission: RE | Admit: 2024-01-11 | Discharge: 2024-01-11 | Disposition: A | Discharge status: Home / Self Care | Attending: Ophthalmology | Admitting: Ophthalmology

## 2024-01-11 DIAGNOSIS — I10 Essential (primary) hypertension: Secondary | ICD-10-CM | POA: Insufficient documentation

## 2024-01-11 DIAGNOSIS — H0100B Unspecified blepharitis left eye, upper and lower eyelids: Secondary | ICD-10-CM | POA: Insufficient documentation

## 2024-01-11 DIAGNOSIS — C6932 Malignant neoplasm of left choroid: Secondary | ICD-10-CM | POA: Insufficient documentation

## 2024-01-11 DIAGNOSIS — I4891 Unspecified atrial fibrillation: Secondary | ICD-10-CM | POA: Diagnosis not present

## 2024-01-11 DIAGNOSIS — K219 Gastro-esophageal reflux disease without esophagitis: Secondary | ICD-10-CM | POA: Insufficient documentation

## 2024-01-11 DIAGNOSIS — H5711 Ocular pain, right eye: Secondary | ICD-10-CM

## 2024-01-11 DIAGNOSIS — E119 Type 2 diabetes mellitus without complications: Secondary | ICD-10-CM | POA: Diagnosis not present

## 2024-01-11 DIAGNOSIS — E1169 Type 2 diabetes mellitus with other specified complication: Secondary | ICD-10-CM

## 2024-01-11 DIAGNOSIS — H0100A Unspecified blepharitis right eye, upper and lower eyelids: Secondary | ICD-10-CM | POA: Insufficient documentation

## 2024-01-11 DIAGNOSIS — H2511 Age-related nuclear cataract, right eye: Secondary | ICD-10-CM | POA: Diagnosis present

## 2024-01-11 DIAGNOSIS — G4730 Sleep apnea, unspecified: Secondary | ICD-10-CM | POA: Insufficient documentation

## 2024-01-11 DIAGNOSIS — H5442A5 Blindness left eye category 5, normal vision right eye: Secondary | ICD-10-CM | POA: Insufficient documentation

## 2024-01-11 DIAGNOSIS — E782 Mixed hyperlipidemia: Secondary | ICD-10-CM

## 2024-01-11 HISTORY — PX: CATARACT EXTRACTION W/PHACO: SHX586

## 2024-01-11 HISTORY — PX: INSERTION, STENT, DRUG-ELUTING, LACRIMAL CANALICULUS: SHX7453

## 2024-01-11 LAB — GLUCOSE, CAPILLARY: Glucose-Capillary: 137 mg/dL — ABNORMAL HIGH (ref 70–99)

## 2024-01-11 SURGERY — PHACOEMULSIFICATION, CATARACT, WITH IOL INSERTION
Anesthesia: Monitor Anesthesia Care | Site: Eye | Laterality: Right

## 2024-01-11 MED ORDER — DEXAMETHASONE 0.4 MG OP INST
VAGINAL_INSERT | OPHTHALMIC | Status: DC | PRN
Start: 2024-01-11 — End: 2024-01-11
  Administered 2024-01-11: .4 mg via OPHTHALMIC

## 2024-01-11 MED ORDER — BSS IO SOLN
INTRAOCULAR | Status: DC | PRN
Start: 2024-01-11 — End: 2024-01-11
  Administered 2024-01-11: 15 mL via INTRAOCULAR

## 2024-01-11 MED ORDER — SODIUM HYALURONATE 10 MG/ML IO SOLUTION
PREFILLED_SYRINGE | INTRAOCULAR | Status: DC | PRN
Start: 2024-01-11 — End: 2024-01-11
  Administered 2024-01-11: .85 mL via INTRAOCULAR

## 2024-01-11 MED ORDER — LIDOCAINE HCL (PF) 1 % IJ SOLN
INTRAMUSCULAR | Status: DC | PRN
  Administered 2024-01-11: 1 mL

## 2024-01-11 MED ORDER — LACTATED RINGERS IV SOLN: INTRAVENOUS | Status: DC

## 2024-01-11 MED ORDER — TROPICAMIDE 1 % OP SOLN
1.0000 [drp] | OPHTHALMIC | Status: AC | PRN
  Administered 2024-01-11 (×3): 1 [drp] via OPHTHALMIC

## 2024-01-11 MED ORDER — POVIDONE-IODINE 5 % OP SOLN
OPHTHALMIC | Status: DC | PRN
Start: 2024-01-11 — End: 2024-01-11
  Administered 2024-01-11: 1 via OPHTHALMIC

## 2024-01-11 MED ORDER — LIDOCAINE HCL 3.5 % OP GEL
1.0000 | Freq: Once | OPHTHALMIC | Status: AC
  Administered 2024-01-11: 1 via OPHTHALMIC

## 2024-01-11 MED ORDER — STERILE WATER FOR IRRIGATION IR SOLN
Status: DC | PRN
  Administered 2024-01-11: 1

## 2024-01-11 MED ORDER — ATORVASTATIN CALCIUM 40 MG PO TABS: 40.0000 mg | ORAL_TABLET | Freq: Every day | ORAL | 3 refills | Status: AC

## 2024-01-11 MED ORDER — MIDAZOLAM HCL 5 MG/5ML IJ SOLN
INTRAMUSCULAR | Status: DC | PRN
  Administered 2024-01-11: 2 mg via INTRAVENOUS

## 2024-01-11 MED ORDER — TETRACAINE HCL 0.5 % OP SOLN
1.0000 [drp] | OPHTHALMIC | Status: AC | PRN
  Administered 2024-01-11 (×3): 1 [drp] via OPHTHALMIC

## 2024-01-11 MED ORDER — MOXIFLOXACIN HCL 5 MG/ML IO SOLN
INTRAOCULAR | Status: DC | PRN
  Administered 2024-01-11: .2 mL via INTRACAMERAL

## 2024-01-11 MED ORDER — DEXAMETHASONE 0.4 MG OP INST
VAGINAL_INSERT | OPHTHALMIC | Status: AC
  Filled 2024-01-11: qty 1

## 2024-01-11 MED ORDER — MIDAZOLAM HCL 2 MG/2ML IJ SOLN
INTRAMUSCULAR | Status: AC
  Filled 2024-01-11: qty 2

## 2024-01-11 MED ORDER — PHENYLEPHRINE HCL 2.5 % OP SOLN
1.0000 [drp] | OPHTHALMIC | Status: AC | PRN
  Administered 2024-01-11 (×3): 1 [drp] via OPHTHALMIC

## 2024-01-11 MED ORDER — PHENYLEPHRINE-KETOROLAC 1-0.3 % IO SOLN
INTRAOCULAR | Status: DC | PRN
  Administered 2024-01-11: 500 mL via OPHTHALMIC

## 2024-01-11 MED ORDER — SODIUM HYALURONATE 23MG/ML IO SOSY
PREFILLED_SYRINGE | INTRAOCULAR | Status: DC | PRN
Start: 2024-01-11 — End: 2024-01-11
  Administered 2024-01-11: .6 mL via INTRAOCULAR

## 2024-01-11 SURGICAL SUPPLY — 10 items
CLOTH BEACON ORANGE TIMEOUT ST (SAFETY) ×1 IMPLANT
EYE SHIELD UNIVERSAL CLEAR (GAUZE/BANDAGES/DRESSINGS) IMPLANT
FEE CATARACT SUITE SIGHTPATH (MISCELLANEOUS) ×1 IMPLANT
GLOVE BIOGEL PI IND STRL 7.0 (GLOVE) ×2 IMPLANT
LENS IOL TECNIS EYHANCE 22.0 (Intraocular Lens) IMPLANT
NDL HYPO 18GX1.5 BLUNT FILL (NEEDLE) ×1 IMPLANT
PAD ARMBOARD POSITIONER FOAM (MISCELLANEOUS) ×1 IMPLANT
SYR TB 1ML LL NO SAFETY (SYRINGE) ×1 IMPLANT
TAPE SURG TRANSPORE 1 IN (GAUZE/BANDAGES/DRESSINGS) IMPLANT
WATER STERILE IRR 250ML POUR (IV SOLUTION) ×1 IMPLANT

## 2024-01-11 NOTE — Transfer of Care (Signed)
 Immediate Anesthesia Transfer of Care Note  Patient: Samuel Hubbard  Procedure(s) Performed: PHACOEMULSIFICATION, CATARACT, WITH IOL INSERTION (Right: Eye) INSERTION, STENT, DRUG-ELUTING, LACRIMAL CANALICULUS (Right: Eye)  Patient Location: PACU  Anesthesia Type:MAC  Level of Consciousness: awake and alert   Airway & Oxygen Therapy: Patient Spontanous Breathing and Patient connected to nasal cannula oxygen  Post-op Assessment: Report given to RN and Post -op Vital signs reviewed and stable  Post vital signs: Reviewed and stable  Last Vitals:  Vitals Value Taken Time  BP 111/72 01/11/24 08:20  Temp 37.6 C 01/11/24 08:20  Pulse 86 01/11/24 08:20  Resp    SpO2 100 % 01/11/24 08:20    Last Pain:  Vitals:   01/11/24 0820  TempSrc: Oral  PainSc: 0-No pain      Patients Stated Pain Goal: 5 (01/11/24 0820)  Complications: No notable events documented.

## 2024-01-11 NOTE — Discharge Instructions (Signed)
 Please discharge patient when stable, will follow up today with Dr. June Leap at the [facility] office immediately following discharge.  Leave shield in place until visit.  All paperwork with discharge instructions will be given at the office.  [facility] Address:  [address]  [place], [place] [address]

## 2024-01-11 NOTE — Anesthesia Preprocedure Evaluation (Signed)
 Anesthesia Evaluation  Patient identified by MRN, date of birth, ID band Patient awake    Reviewed: Allergy & Precautions, H&P , NPO status , Patient's Chart, lab work & pertinent test results, reviewed documented beta blocker date and time   Airway Mallampati: II  TM Distance: >3 FB Neck ROM: full    Dental no notable dental hx.    Pulmonary sleep apnea    Pulmonary exam normal breath sounds clear to auscultation       Cardiovascular Exercise Tolerance: Good hypertension, + dysrhythmias  Rhythm:regular Rate:Normal     Neuro/Psych   Anxiety      Neuromuscular disease  negative psych ROS   GI/Hepatic Neg liver ROS, PUD,GERD  ,,  Endo/Other  diabetesHypothyroidism    Renal/GU negative Renal ROS  negative genitourinary   Musculoskeletal   Abdominal   Peds  Hematology negative hematology ROS (+)   Anesthesia Other Findings   Reproductive/Obstetrics negative OB ROS                              Anesthesia Physical Anesthesia Plan  ASA: 3  Anesthesia Plan: MAC   Post-op Pain Management:    Induction:   PONV Risk Score and Plan:   Airway Management Planned:   Additional Equipment:   Intra-op Plan:   Post-operative Plan:   Informed Consent: I have reviewed the patients History and Physical, chart, labs and discussed the procedure including the risks, benefits and alternatives for the proposed anesthesia with the patient or authorized representative who has indicated his/her understanding and acceptance.     Dental Advisory Given  Plan Discussed with: CRNA  Anesthesia Plan Comments:         Anesthesia Quick Evaluation

## 2024-01-11 NOTE — Telephone Encounter (Signed)
 Copied from CRM #[id number]. Topic: Clinical - Prescription Issue >> Jan 11, 2024  9:59 AM Cleave MATSU wrote: Reason for CRM[place] [facility] a [facility] atorvastatin  40 mg sent in to them. Because it was at another pharmacy and pt transferred there and it has no refills on it. If any questions follow up with pharmacy [phone]

## 2024-01-11 NOTE — Anesthesia Postprocedure Evaluation (Signed)
 Anesthesia Post Note  Patient: Samuel Hubbard  Procedure(s) Performed: PHACOEMULSIFICATION, CATARACT, WITH IOL INSERTION (Right: Eye) INSERTION, STENT, DRUG-ELUTING, LACRIMAL CANALICULUS (Right: Eye)  Patient location during evaluation: Phase II Anesthesia Type: MAC Level of consciousness: awake Pain management: pain level controlled Vital Signs Assessment: post-procedure vital signs reviewed and stable Respiratory status: spontaneous breathing and respiratory function stable Cardiovascular status: blood pressure returned to baseline and stable Postop Assessment: no headache and no apparent nausea or vomiting Anesthetic complications: no Comments: Late entry   No notable events documented.   Last Vitals:  Vitals:   01/11/24 0721 01/11/24 0820  BP: 100/76 111/72  Pulse: 93 86  Resp: (!) 93   Temp: 36.9 C 37.6 C  SpO2: 100% 100%    Last Pain:  Vitals:   01/11/24 0820  TempSrc: Oral  PainSc: 0-No pain                 [name]

## 2024-01-11 NOTE — Op Note (Signed)
 Date of procedure: 01/11/24  Pre-operative diagnosis:  Visually significant age-related nuclear cataract, Right Eye (H25.11)  Post-operative diagnosis:   1. Visually significant age-related nuclear cataract, Right Eye (H25.11) 2. Pain and inflammation following cataract surgery Right Eye (H57.11)  Procedure:  Removal of cataract via phacoemulsification and insertion of intra-ocular lens [employer] DIB00 +22.0D into the capsular bag of the Right Eye 2. Placement of Dextenza  insert, Right Eye  Attending surgeon: Lynwood LABOR. Brelynn Wheller, MD, MA  Anesthesia: MAC, Topical Akten   Complications: None  Estimated Blood Loss: <26mL (minimal)  Specimens: None  Implants: As above  Indications:  Visually significant age-related cataract, Right Eye  Procedure:  The patient was seen and identified in the pre-operative area. The operative eye was identified and dilated.  The operative eye was marked.  Topical anesthesia was administered to the operative eye.     The patient was then to the operative suite and placed in the supine position.  A timeout was performed confirming the patient, procedure to be performed, and all other relevant information.   The patient's face was prepped and draped in the usual fashion for intra-ocular surgery.  A lid speculum was placed into the operative eye and the surgical microscope moved into place and focused.  A superotemporal paracentesis was created using a 20 gauge paracentesis blade. Omidria  was injected into the anterior chamber. Shugarcaine was injected into the anterior chamber.  Viscoelastic was injected into the anterior chamber.  A temporal clear-corneal main wound incision was created using a 2.65mm microkeratome.  A continuous curvilinear capsulorrhexis was initiated using an irrigating cystitome and completed using capsulorrhexis forceps.  Hydrodissection and hydrodeliniation were performed.  Viscoelastic was injected into the anterior chamber.  A  phacoemulsification handpiece and a chopper as a second instrument were used to remove the nucleus and epinucleus. The irrigation/aspiration handpiece was used to remove any remaining cortical material.   The capsular bag was reinflated with viscoelastic, checked, and found to be intact.  The intraocular lens was inserted into the capsular bag.  The irrigation/aspiration handpiece was used to remove any remaining viscoelastic.  The clear corneal wound and paracentesis wounds were then hydrated and checked with Weck-Cels to be watertight. 0.1mL of moxifloxacin  was injected into the anterior chamber.  The lid-speculum was removed. The lower punctum was dilated. A Dextenza  implant was placed in the lower canaliculus without complication.  The drape was removed.  The patient's face was cleaned with a wet and dry 4x4. A clear shield was taped over the eye. The patient was taken to the post-operative care unit in good condition, having tolerated the procedure well.  Post-Op Instructions: The patient will follow up at [facility] for a same day post-operative evaluation and will receive all other orders and instructions.

## 2024-01-11 NOTE — Interval H&P Note (Signed)
 History and Physical Interval Note:  01/11/2024 7:48 AM  Samuel Hubbard  has presented today for surgery, with the diagnosis of nuclear sclerotic cataract, right eye.  The various methods of treatment have been discussed with the patient and family. After consideration of risks, benefits and other options for treatment, the patient has consented to  Procedure(s): PHACOEMULSIFICATION, CATARACT, WITH IOL INSERTION (Right) INSERTION, STENT, DRUG-ELUTING, LACRIMAL CANALICULUS (Right) as a surgical intervention.  The patient's history has been reviewed, patient examined, no change in status, stable for surgery.  I have reviewed the patient's chart and labs.  Questions were answered to the patient's satisfaction.     HARRIE AGENT

## 2024-01-12 ENCOUNTER — Ambulatory Visit: Admitting: Internal Medicine

## 2024-01-12 ENCOUNTER — Encounter (HOSPITAL_COMMUNITY): Payer: Self-pay | Admitting: Ophthalmology

## 2024-01-12 VITALS — BP 110/74 | HR 77 | Ht 72.0 in | Wt 197.2 lb

## 2024-01-12 DIAGNOSIS — E1169 Type 2 diabetes mellitus with other specified complication: Secondary | ICD-10-CM

## 2024-01-12 DIAGNOSIS — E039 Hypothyroidism, unspecified: Secondary | ICD-10-CM | POA: Diagnosis not present

## 2024-01-12 DIAGNOSIS — Z7984 Long term (current) use of oral hypoglycemic drugs: Secondary | ICD-10-CM

## 2024-01-12 DIAGNOSIS — I10 Essential (primary) hypertension: Secondary | ICD-10-CM

## 2024-01-12 DIAGNOSIS — C155 Malignant neoplasm of lower third of esophagus: Secondary | ICD-10-CM

## 2024-01-12 DIAGNOSIS — I152 Hypertension secondary to endocrine disorders: Secondary | ICD-10-CM

## 2024-01-12 DIAGNOSIS — E782 Mixed hyperlipidemia: Secondary | ICD-10-CM

## 2024-01-12 DIAGNOSIS — I480 Paroxysmal atrial fibrillation: Secondary | ICD-10-CM

## 2024-01-12 DIAGNOSIS — K219 Gastro-esophageal reflux disease without esophagitis: Secondary | ICD-10-CM

## 2024-01-12 DIAGNOSIS — Z931 Gastrostomy status: Secondary | ICD-10-CM

## 2024-01-12 DIAGNOSIS — E1159 Type 2 diabetes mellitus with other circulatory complications: Secondary | ICD-10-CM

## 2024-01-12 NOTE — Patient Instructions (Signed)
Please continue to take medications as prescribed.  Please continue to follow low carb diet and ambulate as tolerated. 
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
## 2024-01-13 ENCOUNTER — Ambulatory Visit: Payer: Self-pay | Admitting: Internal Medicine

## 2024-01-13 ENCOUNTER — Inpatient Hospital Stay: Admitting: Oncology

## 2024-01-13 LAB — HEMOGLOBIN A1C
Est. average glucose Bld gHb Est-mCnc: 157 mg/dL
Hgb A1c MFr Bld: 7.1 % — ABNORMAL HIGH (ref 4.8–5.6)

## 2024-01-13 LAB — TSH+FREE T4
Free T4: 1.69 ng/dL (ref 0.82–1.77)
TSH: 0.163 u[IU]/mL — ABNORMAL LOW (ref 0.450–4.500)

## 2024-01-13 MED ORDER — LEVOTHYROXINE SODIUM 200 MCG PO TABS
200.0000 ug | ORAL_TABLET | Freq: Every day | ORAL | 1 refills | Status: AC
Start: 2024-01-13 — End: ?

## 2024-01-14 DIAGNOSIS — Z931 Gastrostomy status: Secondary | ICD-10-CM | POA: Insufficient documentation

## 2024-01-14 LAB — MICROALBUMIN / CREATININE URINE RATIO
Creatinine, Urine: 253.3 mg/dL
Microalb/Creat Ratio: 13 mg/g{creat} (ref 0–29)
Microalbumin, Urine: 34 ug/mL

## 2024-01-14 NOTE — Assessment & Plan Note (Signed)
Continue Lipitor 40 mg QD

## 2024-01-14 NOTE — Assessment & Plan Note (Signed)
 Lab Results  Component Value Date   HGBA1C 7.1 (H) 01/12/2024   Better controlled now Associated with HTN and HLD On metformin  1000 mg twice daily Advised to follow diabetic diet On statin and ARB F/u CMP and lipid panel Diabetic eye exam: Advised to follow up with Ophthalmology for diabetic eye exam

## 2024-01-14 NOTE — Assessment & Plan Note (Signed)
 BP Readings from Last 1 Encounters:  01/12/24 110/74   Well-controlled with olmesartan  40 mg QD and metoprolol  25 mg BID Counseled for compliance with the medications Advised DASH diet and moderate exercise/walking as tolerated

## 2024-01-14 NOTE — Assessment & Plan Note (Signed)
 Continue omeprazole  40 mg BID Has sucralfate  as needed

## 2024-01-14 NOTE — Assessment & Plan Note (Addendum)
 Recently had elevated TSH, but better compared to prior Will recheck TSH and free T4 Currently takes levothyroxine  224 mcg QD, will adjust dose based on TSH and free T4

## 2024-01-14 NOTE — Assessment & Plan Note (Signed)
 Followed by oncology and radiation oncology Had an attempted esophagectomy at [facility], but aborted due to evidence of bulky tumor at the GEJ, splenic and omental metastasis and metastasis localized to gastric fundus and body limiting width of potential conduit  Has had chemoradiation

## 2024-01-14 NOTE — Assessment & Plan Note (Signed)
 Rate controlled with metoprolol  On Eliquis  5 mg twice daily Followed by cardiology

## 2024-01-14 NOTE — Assessment & Plan Note (Signed)
 Due to dysphagia from esophageal carcinoma Planned to see nutritionist for tube feeding counseling

## 2024-01-18 ENCOUNTER — Inpatient Hospital Stay: Attending: Hematology | Admitting: Dietician

## 2024-01-18 ENCOUNTER — Telehealth: Payer: Self-pay | Admitting: Dietician

## 2024-01-18 DIAGNOSIS — C155 Malignant neoplasm of lower third of esophagus: Secondary | ICD-10-CM | POA: Insufficient documentation

## 2024-01-18 DIAGNOSIS — Z7901 Long term (current) use of anticoagulants: Secondary | ICD-10-CM | POA: Insufficient documentation

## 2024-01-18 DIAGNOSIS — Z79890 Hormone replacement therapy (postmenopausal): Secondary | ICD-10-CM | POA: Insufficient documentation

## 2024-01-18 DIAGNOSIS — G629 Polyneuropathy, unspecified: Secondary | ICD-10-CM | POA: Insufficient documentation

## 2024-01-18 DIAGNOSIS — Z7984 Long term (current) use of oral hypoglycemic drugs: Secondary | ICD-10-CM | POA: Insufficient documentation

## 2024-01-18 DIAGNOSIS — Z79899 Other long term (current) drug therapy: Secondary | ICD-10-CM | POA: Insufficient documentation

## 2024-01-18 DIAGNOSIS — I4891 Unspecified atrial fibrillation: Secondary | ICD-10-CM | POA: Insufficient documentation

## 2024-01-18 DIAGNOSIS — K219 Gastro-esophageal reflux disease without esophagitis: Secondary | ICD-10-CM | POA: Insufficient documentation

## 2024-01-18 DIAGNOSIS — E890 Postprocedural hypothyroidism: Secondary | ICD-10-CM | POA: Insufficient documentation

## 2024-01-18 DIAGNOSIS — R1310 Dysphagia, unspecified: Secondary | ICD-10-CM | POA: Insufficient documentation

## 2024-01-18 DIAGNOSIS — F329 Major depressive disorder, single episode, unspecified: Secondary | ICD-10-CM | POA: Insufficient documentation

## 2024-01-18 DIAGNOSIS — Z7952 Long term (current) use of systemic steroids: Secondary | ICD-10-CM | POA: Insufficient documentation

## 2024-01-18 DIAGNOSIS — R112 Nausea with vomiting, unspecified: Secondary | ICD-10-CM | POA: Insufficient documentation

## 2024-01-18 NOTE — Telephone Encounter (Signed)
 Nutrition Follow-up:  Patient with malignant neoplasm of lower third esophagus. He has completed neoadjuvant FLOT q14d x 8 cycles. Esophagectomy at [facility] (8/12) aborted due to concerns for gastric tumor. S/p Jtube. Pathology negative for cancer. Patient completed concurrent chemoradiation with weekly carboplatin /paclitaxel  - final chemo 10/20, last RT 10/23. Patient is under the care of Dr. Davonna Sermon with patient via telephone. Voice and mood are flat. Says all he does is sleep. Patient asking when he will start feeling better. Patient reports appointment scheduled tomorrow will wipe him out. He is relying on Jtube for nutrition. Reports 4 cartons Nutren 1.5 overnight. Patient tolerating liquids po and reports bites of mashed potatoes/gravy on Thanksgiving. Patient started [facility] ST s/p 2 sessions. Denies receiving HEP to complete on off days.    Medications: reviewed   Labs: 11/21 - glucose 168, BUN 24, Cr 0.45 11/25 - HgbA1c - 7.1  Anthropometrics: Wt 197 lb 3.2 oz on 11/25  10/20 - 210 lb  10/6 - 207 lb 3.2 oz    NUTRITION DIAGNOSIS: Food and nutrition related knowledge deficit - continues   INTERVENTION:  Continue to encourage increasing Nutren 1.5 (5-6 cartons/day) to better meet nutrition goals  Educated on strategies for fatigue - encourage daily body movement  Reviewed patient journey over the last year (chemo, attempted surgery, concurrent chemo/RT) and encouraged giving grace and understanding to recovery Suggested asking HH ST for exercises to complete on his own Support and encouragement     MONITORING, EVALUATION, GOAL: wt trends   NEXT VISIT: Monday December 8 via telephone

## 2024-01-19 NOTE — Progress Notes (Signed)
 Radiation Oncology Follow Up Visit Note  Patient Name: Samuel Hubbard Patient Age: 68 y.o. Encounter Date: 01/19/2024    Primary Care Provider: Tobie Suzzane Massy, MD  Patient Care Team: Patient Care Team: Samuel Suzzane Massy, MD as PCP - General (Internal Medicine) Assar, Epimenio, DO as Consulting Physician (Cardiovascular Disease) Samuel Alm Locus, MD (Radiation Oncology) Samuel Siad, MD (Hematology and Oncology) Mansouraty, Aloha Raddle., MD Samuel Linnie KIDD, MD (Cardiothoracic Surgery) Samuel Marianne Monarch, MD MPH (Cardiothoracic Surgery)   Diagnoses: 1. Malignant neoplasm of lower third of esophagus (CMS-HCC)     Cancer Staging <redacted file path>  Malignant neoplasm of lower third of esophagus (CMS-HCC) Staging form: Esophagus - Adenocarcinoma, AJCC 8th Edition - Clinical stage from 01/22/2023: Stage III (cT3, cN0, cM0, G3) - Signed by Samuel Alm Locus, MD on 10/15/2023   Treatment Site: Esophagus GEJunction  Interval Since Completion of Treatment: 4 weeks  Assessment: 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 He was subsequently treated with combined modality therapy of carboplatin  paclitaxel  with radiation treatment completed on 12/10/2023. Restaging imaging demonstrates persistent thickening of the distal esophagus, GE junction and proximal stomach.  There are diffuse pulmonary nodules all less than 5 mm of undetermined significance but possibly inflammatory in nature. He remains symptomatic with fatigue and inability to swallow. Radiographic findings are stable in the esophagus and GE junction/stomach. At this time future therapy is likely palliative/supportive.  Plan:  Esophageal/EG Junction  Cancer --Continue Follow up with medical oncology with labs and imaging per Dr. Davonna; Future therapy determination by Dr. Davonna wo he will see tomorrow Typical follow up is every 3-6 months for years 1-2 then every 6-12 months for 3-5 with imaging and EGD as clinically indicated --Follow up in radiation oncology prn --Continue with follow up with dietitian as they deem appropriate --Continue follow up with the PCP -- Communicated with Dr. Davonna will be seeing the patient tomorrow and will arrange for IV hydration  Interval History: [name] returns today for a  follow-up.  He completed his chemoradiation 12/10/2023.  --Interval History: Since completion of chemoradiation, he has had marked fatigue.  He has seen speech therapy as well as nutrition.  He states he has not been able to eat anything by mouth for the last year but I did discuss with him that he was seen in the emergency department while receiving radiation treatment as well as in March with retained foods within his esophagus.  He does have some mild odynophagia and significant dysphagia.  He has been seen by dietary and has had an increase in his tube feeds. Overall he is not sleeping well.  He prefers to spend majority of his time in bed.  Overall he is frustrated as he described to our nurse. He states he has had episodes of feeling dizzy when standing up.  Is also had longstanding headaches that are unchanged.  Imaging obtained on 01/08/2024 with CT scan of Chest, Abdomen and Pelvis with iv contrast with no disease progression with persistent thickening of the distal esophagus  (see below) and a small right paratracheal node Has PT and ST arranged with [facility] Has had cataract in right eye addressed on 01/11/2024 by Dr. Trula Has seen PCP Mathias) on 01/12/2024 and following his hypothyroidism. Please  note, the patient does not have metastatic disease.  Review of Systems: All other systems reviewed are  negative. Pertinent positives and negatives are above in interval history.  Past Medical, Surgical, Family and Social Histories reviewed and updated in the electronic medical record.  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, class IA PRAME Negative   Presented with progressive dysphagia x 1 year  01/01/2023 EGD large fungating ulcerated mass in the lower third of the esophagus from 30 to 40 cm from the incisors that was partially obstructing and circumferential Bx.-  poorly differentiated invasive adenocarcinoma   01/05/2023 CT CAP with IV contrast- irregular wall thickening involving the distal half of the esophagus in the thoracic region measuring at least 8.5 cm with no associated adenopathy and no findings of metastatic disease in the chest abdomen or pelvis  01/22/2023 PET/CT with circumferential wall thickening in the distal third of the  thoracic esophagus, with maximum SUV 5.7, compatible with  malignancy. No findings of metastatic spread.   02/02/24: EGD/EUS large fungating mass in distal esophagus 32-42 cm from incisors, partially obstructing and circumferential with invasion into muscularis and adventitia    - NGS testing: HER2 (0), MS-stable, TMB-low, CLDN 18-negative - Germline mutation testing: Negative with the VUS of APC and RET      Malignant neoplasm of lower third of esophagus (CMS-HCC)  01/01/2023 Initial Diagnosis   Malignant neoplasm of lower third of esophagus       01/22/2023 -  Cancer Staged   Staging form: Esophagus - Adenocarcinoma, AJCC 8th Edition - Clinical stage from 01/22/2023: Stage III (cT3, cN0, cM0, G3) - Signed by Samuel Alm Locus, MD on 10/15/2023    02/16/2023 - 04/09/2023 Chemotherapy   FLOT (ESOPEC Trial) x 4 cycles [facility])   05/11/2023 Procedure   EGD: ([facility])Severe erosion in the distal esophagus consistent with the location of the tumor.  The esophagus is significantly narrowed.  Biopsies taken at 36, 38, 40 cm.  Pathology at 36 cm, 38 cm show atypical cells present suspicious for adenocarcinoma.  At 40 cm, poorly differentiated invasive mucinous adenocarcinoma with patchy signet ring cells.    06/15/2023 - 07/27/2023 Chemotherapy   FLOT additional 4 cycles ([facility])   08/27/2023 Interval Scan(s)   PET/CT Scan- Mild thickening of distal esophagus with mild hypermetabolic activity. No metastatic disease   09/08/2023 Procedure   EGD (Mansouraty-)-  Impression:                - Food and pill in the esophagus. Removal was successful. - Diffuse esophageal ulcerated tissue with oozing blood concerning for residual cancer. - Non-bleeding gastric ulcer with a clean ulcer base (Forrest Class III). Likely extending from esophagus. - Normal examined duodenum.   09/29/2023 Surgery   09/29/23 EGD, robot assisted esophagectomy, feeding jejunostomy ([facility]) aborted esophagectomy due to evidence of bulky tumor at GEJ, splenic, and omental metastases and metastases localized to gastric fundus and body limiting width of potential conduit. J-tube placed.    09/29/2023 Biopsy   Case: [id number] ([facility]) DIAGNOSIS   A.  Right omental nodule, excision: Fibroadipose tissue with necrosis. Negative for malignancy.   B.  Right omentum nodule 2, excision: Mesothelial-lined fibroadipose tissue and smooth muscle with no pathologic change. Negative for malignancy.     11/02/2023 - 12/10/2023 Radiation   Radiation Therapy Treatment Details (Noted  on 10/21/2023) Site: Overlapping sites of esophagus Technique: IMRT Goal: Curative/Salvage  RTSUMMARY  IMRT: Overlapping sites of esophagus   Treatment Period Technique Fraction Dose Fractions Total Dose  Course 1 11/02/2023-12/10/2023  (days  elapsed: 38)        Esophagus/GEjxn 11/02/2023-12/07/2023 IMRT- VMAT 180 / 180 cGy 25 / 25 4,500 / 4,500 cGy        Bst Esophagus 12/08/2023-12/10/2023 IMRT- VMAT 180 / 180 cGy 3 / 3 540 / 540 cGy        11/02/2023 - 12/07/2023 Chemotherapy   Weekly Carboplatin /paclitaxel  concurrent with radiation      Physical Exam: There were no vitals filed for this visit. ECOG: 2 General: Ill-appearing man sitting in chair in NAD Psychiatric:  Depressed mood and flat affect although converses clearly and emotionally appropriate.  HEENT:  EOMI, MMM Cardio: RRR, no m/r/g Respiratory: LCTAB, no w/r/r Abdomen: Soft, non-tender, non-distended. Normal active bowel sounds. Feeding tube intact. No hepatomegaly. No flank tenderness Extremities:  No clubbing cyanosis, or edema    Labs   01/08/2024 Mg 1.8  01/12/2024 TSH 0.163 Free T4 1.69  Radiology 01/08/2024 CT Chest, Abdomen and Pelvis with Contrast Reviewed exam limited due to no oral contrast but concur with the impression except clear extension into the stomach (see below) IMPRESSION:  1. Circumferential wall thickening in the distal esophagus is  similar to prior PET-CT.  2. 8 mm short axis paraesophageal node is minimally progressive in  the interval with adjacent eccentric focal wall thickening in the  proximal anterior esophagus. Indeterminate on the current study but  warrants close attention on follow-up.  3. Innumerable scattered tiny ground-glass nodules in both lungs  with scattered solid pulmonary nodules measuring up to 5 mm. These  are nonspecific and likely infectious/inflammatory. Atypical  etiology should be considered. Follow-up recommended to ensure  resolution.  4. No evidence for metastatic disease in the abdomen or pelvis.  5.  Aortic Atherosclerosis (ICD10-I70.0).      Alm CHARLENA Blumenthal, MD [facility] Radiation Oncology [phone]   01/19/24 1:08 PM

## 2024-01-20 ENCOUNTER — Other Ambulatory Visit: Payer: Self-pay

## 2024-01-20 ENCOUNTER — Inpatient Hospital Stay

## 2024-01-20 ENCOUNTER — Other Ambulatory Visit: Payer: Self-pay | Admitting: *Deleted

## 2024-01-20 ENCOUNTER — Inpatient Hospital Stay (HOSPITAL_BASED_OUTPATIENT_CLINIC_OR_DEPARTMENT_OTHER): Admitting: Oncology

## 2024-01-20 VITALS — BP 117/90 | HR 55 | Temp 97.4°F | Resp 16

## 2024-01-20 DIAGNOSIS — C155 Malignant neoplasm of lower third of esophagus: Secondary | ICD-10-CM

## 2024-01-20 DIAGNOSIS — F419 Anxiety disorder, unspecified: Secondary | ICD-10-CM

## 2024-01-20 DIAGNOSIS — R1310 Dysphagia, unspecified: Secondary | ICD-10-CM | POA: Diagnosis not present

## 2024-01-20 DIAGNOSIS — E039 Hypothyroidism, unspecified: Secondary | ICD-10-CM | POA: Diagnosis not present

## 2024-01-20 DIAGNOSIS — I4891 Unspecified atrial fibrillation: Secondary | ICD-10-CM | POA: Diagnosis not present

## 2024-01-20 DIAGNOSIS — R1319 Other dysphagia: Secondary | ICD-10-CM | POA: Diagnosis not present

## 2024-01-20 DIAGNOSIS — Z7952 Long term (current) use of systemic steroids: Secondary | ICD-10-CM | POA: Diagnosis not present

## 2024-01-20 DIAGNOSIS — Z7984 Long term (current) use of oral hypoglycemic drugs: Secondary | ICD-10-CM | POA: Diagnosis not present

## 2024-01-20 DIAGNOSIS — E890 Postprocedural hypothyroidism: Secondary | ICD-10-CM | POA: Diagnosis not present

## 2024-01-20 DIAGNOSIS — Z79899 Other long term (current) drug therapy: Secondary | ICD-10-CM | POA: Diagnosis not present

## 2024-01-20 DIAGNOSIS — Z7901 Long term (current) use of anticoagulants: Secondary | ICD-10-CM | POA: Diagnosis not present

## 2024-01-20 DIAGNOSIS — K219 Gastro-esophageal reflux disease without esophagitis: Secondary | ICD-10-CM | POA: Diagnosis not present

## 2024-01-20 DIAGNOSIS — R112 Nausea with vomiting, unspecified: Secondary | ICD-10-CM | POA: Diagnosis not present

## 2024-01-20 DIAGNOSIS — R531 Weakness: Secondary | ICD-10-CM | POA: Diagnosis not present

## 2024-01-20 DIAGNOSIS — Z79890 Hormone replacement therapy (postmenopausal): Secondary | ICD-10-CM | POA: Diagnosis not present

## 2024-01-20 DIAGNOSIS — G629 Polyneuropathy, unspecified: Secondary | ICD-10-CM | POA: Diagnosis not present

## 2024-01-20 DIAGNOSIS — F329 Major depressive disorder, single episode, unspecified: Secondary | ICD-10-CM | POA: Diagnosis not present

## 2024-01-20 MED ORDER — SODIUM CHLORIDE 0.9 % IV SOLN: Freq: Once | INTRAVENOUS | Status: AC

## 2024-01-20 MED ORDER — MIRTAZAPINE 15 MG PO TABS: 15.0000 mg | ORAL_TABLET | Freq: Every day | ORAL | 2 refills | Status: DC

## 2024-01-20 MED ORDER — ONDANSETRON HCL 4 MG/2ML IJ SOLN
8.0000 mg | Freq: Once | INTRAMUSCULAR | Status: AC
  Administered 2024-01-20: 8 mg via INTRAVENOUS
  Filled 2024-01-20: qty 4

## 2024-01-20 MED ORDER — LORAZEPAM 0.5 MG PO TABS: 0.5000 mg | ORAL_TABLET | Freq: Two times a day (BID) | ORAL | 2 refills | Status: AC

## 2024-01-20 NOTE — Patient Instructions (Signed)
 [facility] at [facility] Discharge Instructions   You were seen and examined today by Dr. Davonna.  She reviewed the results of your scan which is good. We will repeat a PET scan in 2 months.    Stop taking magnesium  supplements.   We will see you back in 2 months after the scan to review the results.   Return as scheduled.    Thank you for choosing [facility] at [facility] to provide your oncology and hematology care.  To afford each patient quality time with our provider, please arrive at least 15 minutes before your scheduled appointment time.   If you have a lab appointment with the Cancer Center please come in thru the Main Entrance and check in at the main information desk.  You need to re-schedule your appointment should you arrive 10 or more minutes late.  We strive to give you quality time with our providers, and arriving late affects you and other patients whose appointments are after yours.  Also, if you no show three or more times for appointments you may be dismissed from the clinic at the providers discretion.     Again, thank you for choosing [facility].  Our hope is that these requests will decrease the amount of time that you wait before being seen by our physicians.       _____________________________________________________________  Should you have questions after your visit to [facility], please contact our office at [phone] and follow the prompts.  Our office hours are 8:00 a.m. and 4:30 p.m. Monday - Friday.  Please note that voicemails left after 4:00 p.m. may not be returned until the following business day.  We are closed weekends and major holidays.  You do have access to a nurse 24-7, just call the main number to the clinic [phone] and do not press any options, hold on the line and a nurse will answer the phone.    For prescription refill requests, have your pharmacy contact our  office and allow 72 hours.    Due to Covid, you will need to wear a mask upon entering the hospital. If you do not have a mask, a mask will be given to you at the Main Entrance upon arrival. For doctor visits, patients may have 1 support person age 64 or older with them. For treatment visits, patients can not have anyone with them due to social distancing guidelines and our immunocompromised population.

## 2024-01-20 NOTE — Progress Notes (Signed)
 " Patient Care Team: Tobie Suzzane POUR, MD as PCP - General (Internal Medicine) Mallipeddi, [name], MD as PCP - Cardiology (Cardiology) [name], MD as Medical Oncologist (Medical Oncology) [name], RN as Oncology Nurse Navigator (Medical Oncology) Dr Willma [facility], OD (Optometry) [name], [name], MD as Consulting Physician (Gastroenterology)  Clinic Day:  01/20/2024  Referring physician: [name], MD   CHIEF COMPLAINT:  CC: Stage III (T3 N0 M0 G3) poorly differentiated adenocarcinoma of distal esophagus   ASSESSMENT & PLAN:   Assessment & Plan: Samuel Hubbard  is a 68 y.o. male with stage III (T3 N0 M0 G3) poorly differentiated adenocarcinoma of distal esophagus   Assessment and Plan  Malignant neoplasm of the lower third of the esophagus  Stage III adenocarcinoma of distal esophagus S/p perioperative chemotherapy with FLOT X 8 cycles Had an attempted esophagectomy at [facility] but aborted due evidence of bulky tumor at [facility], splenic, and omental metastases and metastases localized to gastric fundus and body limiting width of potential conduit.  Omental nodule biopsies did not show any malignancy 11/02/2023 - 12/07/2023: Chemo RT with carboplatin  and paclitaxel   - We reviewed the CT scan findings together. Para esophageal node that is more or less stable. Discussed at multidisciplinary TB. Recommendations for closer monitoring.  - Will repeat PET scan in 2 months  Return to clinic with labs in 2 weeks with APP followed by in 2 months with me to review PET scan findings.   Major depressive disorder with anxiety Current Celexa  treatment ineffective. Symptoms may be exacerbated by anxiety.  - Started Remeron  for mood improvement and appetite stimulation. - Started Ativan  for anxiety management. - Referred to therapy for additional support. - Scheduled follow-up in two weeks to assess response to treatment.  Dysphagia and weight loss  secondary to cancer therapy Dysphagia and weight loss due to radiation therapy. Five-pound weight loss since starting radiation. Dysphagia likely from radiation-induced swelling.  Regained weight recently.  210 pounds today.  - Continue feeding tube nutrition as tolerated. - Continue to follow-up with [name](nutritionist) for nutritional management.   Peripheral neuropathy Numbness and tingling in extremities likely related to cancer treatment.  Stable at this time  Hypothyroidism post-thyroidectomy Hypothyroidism managed with levothyroxine . Current dose is high but stable.  Currently on 200mcg daily.(Recently changed)    - Continue current thyroid  medication. - Managed by primary care  Diarrhea Diarrhea likely due to magnesium  supplementation.   - Discontinued magnesium  supplementation.    The patient understands the plans discussed today and is in agreement with them.  He knows to contact our office if he develops concerns prior to his next appointment.  The total time spent in the appointment was 40 minutes for the encounter with patient, including review of chart and various tests results, discussions about plan of care and coordination of care plan   Siad Davonna, MD  [facility] [place] - A DEPT OF [facility] [address] [place] [address] Dept: [phone] Dept Fax: [phone]   No orders of the defined types were placed in this encounter.    ONCOLOGY HISTORY:   I have reviewed his chart and materials related to his cancer extensively and collaborated history with the patient. Summary of oncologic history is as follows:   Diagnosis: Stage III (T3 N0 M0 G3) poorly differentiated adenocarcinoma of distal esophagus  -01/01/2023: EGD found: Partially obstructing, large, fungating mass in the lower third of  the esophagus involving the GE junction and 30 to 40 cm in size.  Pathology: Poorly  differentiated adenocarcinoma with mucinous and signet ring cell features. -01/05/2023: CT CAP:  Substantial irregular wall thickening of the distal half of the thoracic esophagus extending over a 8.5 cm length, compatible with tumor. No findings of metastatic disease in the chest, abdomen, or pelvis. -01/22/2023: Initial PET: Circumferential wall thickening in the distal third of the thoracic esophagus, with maximum SUV 5.7, compatible with malignancy. -01/29/2023: Germline mutation testing: Negative with the VUS of APC and RET -02/02/2023: EGD/EUS: Large fungating and ulcerating mass in the distal esophagus, at the GE junction extending into the cardia, 32 to 42 cm from the incisors. Mass was partially obstructing and circumferential. Sonographic evidence suggesting invasion into the muscularis propria and adventitia. 1 enlarged lymph node in the lower paraesophageal mediastinum measuring 4 x 3 mm, which was round, hypoechoic and had well-defined margins. FNB not performed.  -[facility] NGS testing: HER2 (0), MS-stable, TMB-low, CLDN 18-negative -02/16/2023-04/09/2023: 4 cycles of neoadjuvant FLOT (ESOPEC trial) -04/23/2023: Patient evaluated by Dr. Medora rodes surgeon], who recommended esophagectomy. Patient was reluctant to undergo surgery, so proceeded with EGD with dilation and biopsy.  -05/11/2023: EGD: Severe erosion in the distal esophagus consistent with the location of the tumor. The esophagus is significantly narrowed. Biopsies taken at 36, 38, 40 cm.  -Pathology ar 36 cm and 38 cm: Atypical cells present suspicious for adenocarcinoma.  -Pathology at 40 cm: Poorly differentiated invasive mucinous adenocarcinoma with patchy signet ring cells.  -06/04/2023: Patient seen by Dr. Sheilda coons oncologist] at [facility] who recommended 4 additional cycles of FLOT and surgical reevaluation. -06/15/2023 - 07/27/2023: 4 more cycles of FLOT completed -08/27/2023: PET: Mild thickening of distal esophagus  with mild hypermetabolic activity. No evidence of hypermetabolic disease in the chest, abdomen or pelvis.  -09/29/2023: CT CAP: No measurable peritoneal or omental disease. Small volume trace free fluid versus ill-defined omental/peritoneal implants as above. Diffuse wall thickening of the gastroesophageal junction, compatible with malignancy.  Bilateral lower lung predominant consolidation and groundglass, suspicious for aspiration and atelectasis, infection could have a similar appearance. Trace bilateral pleural effusions.  -09/29/2023: Attempted esophagectomy with Dr.Salfity at [facility]  but aborted due evidence of bulky tumor at [facility], splenic, and omental metastases and metastases localized to gastric fundus and body limiting width of potential conduit. A j-tube was placed. Omental nodules were biopsied -09/29/2023: Right omentum nodule biopsy.  -Pathology of right omentum nodule 2: Mesothelial-lined fibroadipose tissue and smooth muscle with no pathologic change. Negative for malignancy. -Pathology of right omental nodule 1: Fibroadipose tissue with necrosis. Negative for malignancy. - 11/02/2023-12/07/2023: ChemoRT with carboplatin  and paclitaxel . (Chose not 5-FU based regimen as patient did not have great response to 5-FU based regimen previously) - 01/08/2024: CT CAP: Circumferential wall thickening in the distal esophagus is similar to prior PET-CT. 8 mm short axis paraesophageal node is minimally progressive in the interval with adjacent eccentric focal wall thickening in the proximal anterior esophagus.  Current Treatment: Chemo RT with carboplatin  and paclitaxel   INTERVAL HISTORY:   Discussed the use of AI scribe software for clinical note transcription with the patient, who gave verbal consent to proceed.  History of Present Illness Samuel Hubbard is a 68 year old male with a history of esophageal cancer who presents with fatigue, lack of appetite, and gastrointestinal symptoms. He is  accompanied by his wife today.   He experiences significant fatigue, feeling tired constantly and primarily wanting to sleep. He sleeps  excessively and feels a lack of enjoyment in life, affecting his motivation.  He describes gastrointestinal symptoms, including diarrhea and occasional harder stools, but no constipation. He also experiences nausea and occasional vomiting, for which he takes Zofran .  He has a lack of appetite and difficulty swallowing, stating he 'can't eat at all' and 'can't swallow at all.' He relies on a J tube for nutrition, describing it as his 'only survival.'  He experiences widespread pain, including headaches and stomach pain, and feels dizzy. He also mentions having bad dreams and being restless at night.  His mood is 'not good,' and he feels that Celexa  is not effective. He expresses feelings of being 'down' and 'in pain' all the time. Patient was very tearful during the visit but denied any suicidal ideations.    I have reviewed the past medical history, past surgical history, social history and family history with the patient and they are unchanged from previous note.  ALLERGIES:  is allergic to other, wasp venom, and zolpidem.  MEDICATIONS:  Current Outpatient Medications  Medication Sig Dispense Refill   apixaban  (ELIQUIS ) 5 MG TABS tablet Take 5 mg by mouth 2 (two) times daily.     atorvastatin  (LIPITOR) 40 MG tablet Take 1 tablet (40 mg total) by mouth daily. 90 tablet 3   blood glucose meter kit and supplies Dispense based on patient and insurance preference. Once daily testing DX E11.9 1 each 0   citalopram  (CELEXA ) 20 MG tablet Take 1 tablet (20 mg total) by mouth daily. 90 tablet 3   dexamethasone  (DECADRON ) 4 MG tablet Take 2 tablets daily for 2 days, start the day after chemotherapy. Take with food. 30 tablet 1   glucose blood test strip Use as instructed 100 each 12   Lancets 30G MISC Once daily testing dx e11.9 100 each 5   levothyroxine   (SYNTHROID ) 200 MCG tablet Take 1 tablet (200 mcg total) by mouth daily. 90 tablet 1   lidocaine -prilocaine  (EMLA ) cream Apply to affected area once 30 g 3   magnesium  oxide (MAG-OX) 400 (240 Mg) MG tablet Take 1 tablet (400 mg total) by mouth daily. 90 tablet 2   metFORMIN  (GLUCOPHAGE ) 500 MG tablet Take 2 tablets (1,000 mg total) by mouth 2 (two) times daily with a meal. 120 tablet 3   metoprolol  tartrate (LOPRESSOR ) 25 MG tablet Take 25 mg by mouth 2 (two) times daily.     nitroGLYCERIN  (NITROSTAT ) 0.4 MG SL tablet Place 1 tablet (0.4 mg total) under the tongue every 5 (five) minutes as needed for chest pain. 50 tablet 3   olmesartan  (BENICAR ) 40 MG tablet TAKE 1 TABLET BY MOUTH EVERY DAY 90 tablet 1   omeprazole  (PRILOSEC) 40 MG capsule Take 1 capsule (40 mg total) by mouth 2 (two) times daily. Swallow granules 180 capsule 3   prochlorperazine  (COMPAZINE ) 10 MG tablet TAKE 1 TABLET BY MOUTH EVERY 6 HOURS AS NEEDED FOR NAUSEA AND VOMITING 30 tablet 1   sucralfate  (CARAFATE ) 1 g tablet 1 g by Enteral route.     TYLENOL  CHILDRENS 160 MG/5ML suspension TAKE 20.3 ML BY MOUTH EVERY 6 HOURS AS NEEDED     No current facility-administered medications for this visit.    VITALS:  There were no vitals taken for this visit.  Wt Readings from Last 3 Encounters:  01/12/24 197 lb 3.2 oz (89.4 kg)  01/07/24 200 lb (90.7 kg)  12/07/23 210 lb (95.3 kg)    There is no height or weight on  file to calculate BMI.  Performance status (ECOG): 1 - Symptomatic but completely ambulatory  PHYSICAL EXAM:   GENERAL:Tearful, anxious SKIN: skin color, texture, turgor are normal, no rashes or significant lesions, port site clean LYMPH:  no palpable lymphadenopathy in the cervical, axillary or inguinal LUNGS: clear to auscultation and percussion with normal breathing effort HEART: regular rate & rhythm and no murmurs and no lower extremity edema ABDOMEN:abdomen soft, non-tender and normal bowel sounds, PEG tube  site clean Musculoskeletal:no cyanosis of digits and no clubbing  NEURO: alert & oriented x 3 with fluent speech  LABORATORY DATA:  I have reviewed the data as listed  Lab Results  Component Value Date   WBC 2.6 (L) 01/08/2024   NEUTROABS 1.6 (L) 01/08/2024   HGB 9.7 (L) 01/08/2024   HCT 30.8 (L) 01/08/2024   MCV 93.9 01/08/2024   PLT 205 01/08/2024     Chemistry      Component Value Date/Time   NA 137 01/08/2024 0954   NA 140 12/01/2022 1513   K 4.4 01/08/2024 0954   CL 98 01/08/2024 0954   CO2 30 01/08/2024 0954   BUN 24 (H) 01/08/2024 0954   BUN 17 12/01/2022 1513   CREATININE 0.45 (L) 01/08/2024 0954   CREATININE 1.10 10/13/2018 1054      Component Value Date/Time   CALCIUM  9.2 01/08/2024 0954   ALKPHOS 84 01/08/2024 0954   AST 20 01/08/2024 0954   ALT 28 01/08/2024 0954   BILITOT 0.3 01/08/2024 0954   BILITOT 0.3 12/01/2022 1513      Latest Reference Range & Units 01/12/24 15:25  Hemoglobin A1C 4.8 - 5.6 % 7.1 (H)  Est. average glucose Bld gHb Est-mCnc mg/dL 842  TSH 9.549 - 5.499 uIU/mL 0.163 (L)  T4,Free(Direct) 0.82 - 1.77 ng/dL 8.30  (H): Data is abnormally high (L): Data is abnormally low  RADIOGRAPHIC STUDIES: I have personally reviewed the radiological images as listed and agreed with the findings in the report.  CT CHEST ABDOMEN PELVIS W CONTRAST CLINICAL DATA:  Stage III adenocarcinoma of the distal esophagus. Restaging. * Tracking Code: BO *  EXAM: CT CHEST, ABDOMEN, AND PELVIS WITH CONTRAST  TECHNIQUE: Multidetector CT imaging of the chest, abdomen and pelvis was performed following the standard protocol during bolus administration of intravenous contrast.  RADIATION DOSE REDUCTION: This exam was performed according to the departmental dose-optimization program which includes automated exposure control, adjustment of the mA and/or kV according to patient size and/or use of iterative reconstruction technique.  CONTRAST:  OMNIPAQUE   IOHEXOL  300 MG/ML  SOLN  COMPARISON:  PET-CT 04/13/2023.  FINDINGS: CT CHEST FINDINGS  Cardiovascular: The heart size is normal. No substantial pericardial effusion. Coronary artery calcification is evident. Mild atherosclerotic calcification is noted in the wall of the thoracic aorta. Right Port-A-Cath tip is positioned in the distal SVC near the junction with the RA.  Mediastinum/Nodes: 8 mm short axis paraesophageal node on image 16/2 is minimally progressive in the interval with adjacent asymmetric focal wall thickening in the proximal anterior esophagus. Circumferential wall thickening in the distal esophagus is similar to prior PET-CT. No hilar lymphadenopathy. No axillary lymphadenopathy.  Lungs/Pleura: Centrilobular emphsyema noted. Innumerable scattered tiny ground-glass nodules are seen in both lungs with r scattered solid pulmonary nodules evident including 4 mm right upper lobe nodule on 70/5 and 5 mm right lower lobe nodule on 100/5. no pleural effusion.  Musculoskeletal: No worrisome lytic or sclerotic osseous abnormality.  CT ABDOMEN PELVIS FINDINGS  Hepatobiliary: No  suspicious focal abnormality within the liver parenchyma. There is no evidence for gallstones, gallbladder wall thickening, or pericholecystic fluid. No intrahepatic or extrahepatic biliary dilation.  Pancreas: No focal mass lesion. No dilatation of the main duct. No intraparenchymal cyst. No peripancreatic edema.  Spleen: No splenomegaly. No suspicious focal mass lesion.  Adrenals/Urinary Tract: No adrenal nodule or mass. Right kidney unremarkable. 3.8 cm exophytic cyst interpolar left kidney slightly increased from 3.3 cm (remeasured) on the previous study. Tiny well-defined homogeneous low-density lesions in both kidneys are too small to characterize but are statistically most likely benign and probably cysts. No followup imaging is recommended. No evidence for hydroureter. The urinary  bladder appears normal for the degree of distention.  Stomach/Bowel: Wall thickening noted at the gastroesophageal junction. Duodenum is normally positioned as is the ligament of Treitz. J tube is new in the interval. No small bowel wall thickening. No small bowel dilatation. The terminal ileum is normal. The appendix is not well visualized, but there is no edema or inflammation in the region of the cecal tip to suggest appendicitis. No gross colonic mass. No colonic wall thickening. Diverticular changes are noted in the left colon without evidence of diverticulitis.  Vascular/Lymphatic: There is mild atherosclerotic calcification of the abdominal aorta without aneurysm. There is no gastrohepatic or hepatoduodenal ligament lymphadenopathy. No retroperitoneal or mesenteric lymphadenopathy. No pelvic sidewall lymphadenopathy.  Reproductive: Prostate gland is enlarged.  Other: Trace free fluid in the pelvis.  Musculoskeletal: No worrisome lytic or sclerotic osseous abnormality.  IMPRESSION: 1. Circumferential wall thickening in the distal esophagus is similar to prior PET-CT. 2. 8 mm short axis paraesophageal node is minimally progressive in the interval with adjacent eccentric focal wall thickening in the proximal anterior esophagus. Indeterminate on the current study but warrants close attention on follow-up. 3. Innumerable scattered tiny ground-glass nodules in both lungs with scattered solid pulmonary nodules measuring up to 5 mm. These are nonspecific and likely infectious/inflammatory. Atypical etiology should be considered. Follow-up recommended to ensure resolution. 4. No evidence for metastatic disease in the abdomen or pelvis. 5.  Aortic Atherosclerosis (ICD10-I70.0).  Electronically Signed   By: Camellia Candle M.D.   On: 01/15/2024 07:29    "

## 2024-01-20 NOTE — Progress Notes (Unsigned)
 Patient presents today for fluids and antiemetics per providers order.  Patient took 8 mg of PO Zofran  at home and is still nauseated.  Discussed with Pharmacist and will give 8 mg IV Zofran  from supportive plan and will address any further nausea the patient may have.

## 2024-01-21 ENCOUNTER — Encounter: Payer: Self-pay | Admitting: Oncology

## 2024-01-21 ENCOUNTER — Encounter (HOSPITAL_COMMUNITY)

## 2024-01-21 ENCOUNTER — Other Ambulatory Visit: Payer: Self-pay | Admitting: *Deleted

## 2024-01-21 DIAGNOSIS — G893 Neoplasm related pain (acute) (chronic): Secondary | ICD-10-CM

## 2024-01-21 MED ORDER — OXYCODONE HCL 5 MG/5ML PO SOLN: 5.0000 mg | ORAL | 0 refills | Status: DC | PRN

## 2024-01-21 NOTE — Progress Notes (Signed)
 The proposed treatment discussed in conference is for discussion purpose only and is not a binding recommendation.  The patients have not been physically examined, or presented with their treatment options.  Therefore, final treatment plans cannot be decided.

## 2024-01-25 ENCOUNTER — Telehealth: Payer: Self-pay | Admitting: Dietician

## 2024-01-25 ENCOUNTER — Inpatient Hospital Stay: Admitting: Dietician

## 2024-01-25 NOTE — Telephone Encounter (Signed)
 Nutrition Follow-up:  Patient with malignant neoplasm of lower third esophagus. He has completed neoadjuvant FLOT q14d x 8 cycles. Esophagectomy at [facility] (8/12) aborted due to concerns for gastric tumor. S/p Jtube. Pathology negative for cancer. Patient completed concurrent chemoradiation with weekly carboplatin /paclitaxel  - final chemo 10/20, last RT 10/23. Patient is under the care of Dr. Davonna   Brief nutrition follow-up with patient via telephone. He reports things are about the same. Patient states he is depressed. He is currently taking Celexa  for mood. Patient is working with [facility]. Had session today. Says this is going okay, but not getting enough sessions due to the holiday. He did ask for additional exercises as RD suggested. Reports SLP did not have recommendations. He continues relying on Jtube.   Medications: reviewed   Labs: 11/21 - BUN 24, Cr 0.45, glucose 168 11/25 HgbA1c - 7.1  Anthropometrics: No new wts to trend   NUTRITION DIAGNOSIS: Food and nutrition related knowledge deficit - continues    INTERVENTION:  Recommend completing daily swallowing exercises that he completes with SLP Encouraged pt to consider talk therapy, he is agreeable - referral to LCSW Support and encouragement   MONITORING, EVALUATION, GOAL: wt trends, intake, TF   NEXT VISIT: Monday January 5 via telephone

## 2024-01-28 ENCOUNTER — Inpatient Hospital Stay: Admitting: Licensed Clinical Social Worker

## 2024-01-28 DIAGNOSIS — C155 Malignant neoplasm of lower third of esophagus: Secondary | ICD-10-CM

## 2024-02-01 ENCOUNTER — Emergency Department (HOSPITAL_COMMUNITY)

## 2024-02-01 ENCOUNTER — Other Ambulatory Visit: Payer: Self-pay

## 2024-02-01 ENCOUNTER — Inpatient Hospital Stay

## 2024-02-01 ENCOUNTER — Inpatient Hospital Stay: Admitting: Physician Assistant

## 2024-02-01 ENCOUNTER — Observation Stay (HOSPITAL_COMMUNITY)
Admission: EM | Admit: 2024-02-01 | Discharge: 2024-02-03 | DRG: 309 | Disposition: A | Attending: Family Medicine | Admitting: Family Medicine

## 2024-02-01 ENCOUNTER — Encounter (HOSPITAL_COMMUNITY): Payer: Self-pay

## 2024-02-01 VITALS — BP 87/63 | HR 147 | Temp 97.5°F | Resp 16 | Wt 200.4 lb

## 2024-02-01 DIAGNOSIS — R296 Repeated falls: Secondary | ICD-10-CM | POA: Diagnosis present

## 2024-02-01 DIAGNOSIS — E785 Hyperlipidemia, unspecified: Secondary | ICD-10-CM | POA: Diagnosis present

## 2024-02-01 DIAGNOSIS — E860 Dehydration: Secondary | ICD-10-CM | POA: Diagnosis present

## 2024-02-01 DIAGNOSIS — Z79890 Hormone replacement therapy (postmenopausal): Secondary | ICD-10-CM

## 2024-02-01 DIAGNOSIS — E861 Hypovolemia: Secondary | ICD-10-CM | POA: Diagnosis present

## 2024-02-01 DIAGNOSIS — F32A Depression, unspecified: Secondary | ICD-10-CM | POA: Diagnosis present

## 2024-02-01 DIAGNOSIS — Z803 Family history of malignant neoplasm of breast: Secondary | ICD-10-CM

## 2024-02-01 DIAGNOSIS — Z8501 Personal history of malignant neoplasm of esophagus: Secondary | ICD-10-CM

## 2024-02-01 DIAGNOSIS — R0602 Shortness of breath: Secondary | ICD-10-CM | POA: Diagnosis present

## 2024-02-01 DIAGNOSIS — Z923 Personal history of irradiation: Secondary | ICD-10-CM

## 2024-02-01 DIAGNOSIS — Z7901 Long term (current) use of anticoagulants: Secondary | ICD-10-CM

## 2024-02-01 DIAGNOSIS — Z7984 Long term (current) use of oral hypoglycemic drugs: Secondary | ICD-10-CM

## 2024-02-01 DIAGNOSIS — E1165 Type 2 diabetes mellitus with hyperglycemia: Secondary | ICD-10-CM | POA: Diagnosis present

## 2024-02-01 DIAGNOSIS — K5900 Constipation, unspecified: Secondary | ICD-10-CM | POA: Diagnosis present

## 2024-02-01 DIAGNOSIS — E440 Moderate protein-calorie malnutrition: Secondary | ICD-10-CM | POA: Diagnosis present

## 2024-02-01 DIAGNOSIS — Z85840 Personal history of malignant neoplasm of eye: Secondary | ICD-10-CM

## 2024-02-01 DIAGNOSIS — Z87442 Personal history of urinary calculi: Secondary | ICD-10-CM

## 2024-02-01 DIAGNOSIS — C155 Malignant neoplasm of lower third of esophagus: Secondary | ICD-10-CM

## 2024-02-01 DIAGNOSIS — E039 Hypothyroidism, unspecified: Secondary | ICD-10-CM | POA: Diagnosis present

## 2024-02-01 DIAGNOSIS — G4733 Obstructive sleep apnea (adult) (pediatric): Secondary | ICD-10-CM | POA: Diagnosis present

## 2024-02-01 DIAGNOSIS — Z95828 Presence of other vascular implants and grafts: Secondary | ICD-10-CM

## 2024-02-01 DIAGNOSIS — Z9221 Personal history of antineoplastic chemotherapy: Secondary | ICD-10-CM

## 2024-02-01 DIAGNOSIS — Z6827 Body mass index (BMI) 27.0-27.9, adult: Secondary | ICD-10-CM

## 2024-02-01 DIAGNOSIS — Z8249 Family history of ischemic heart disease and other diseases of the circulatory system: Secondary | ICD-10-CM

## 2024-02-01 DIAGNOSIS — G893 Neoplasm related pain (acute) (chronic): Secondary | ICD-10-CM

## 2024-02-01 DIAGNOSIS — I480 Paroxysmal atrial fibrillation: Principal | ICD-10-CM | POA: Diagnosis present

## 2024-02-01 DIAGNOSIS — Z931 Gastrostomy status: Secondary | ICD-10-CM

## 2024-02-01 DIAGNOSIS — I959 Hypotension, unspecified: Secondary | ICD-10-CM | POA: Diagnosis present

## 2024-02-01 DIAGNOSIS — D649 Anemia, unspecified: Secondary | ICD-10-CM | POA: Diagnosis present

## 2024-02-01 DIAGNOSIS — Z801 Family history of malignant neoplasm of trachea, bronchus and lung: Secondary | ICD-10-CM

## 2024-02-01 DIAGNOSIS — K219 Gastro-esophageal reflux disease without esophagitis: Secondary | ICD-10-CM | POA: Diagnosis present

## 2024-02-01 DIAGNOSIS — I4891 Unspecified atrial fibrillation: Principal | ICD-10-CM | POA: Diagnosis present

## 2024-02-01 DIAGNOSIS — Z79899 Other long term (current) drug therapy: Secondary | ICD-10-CM

## 2024-02-01 DIAGNOSIS — I10 Essential (primary) hypertension: Secondary | ICD-10-CM | POA: Diagnosis present

## 2024-02-01 LAB — CBC WITH DIFFERENTIAL/PLATELET
Abs Immature Granulocytes: 0.01 K/uL (ref 0.00–0.07)
Basophils Absolute: 0 K/uL (ref 0.0–0.1)
Basophils Relative: 1 %
Eosinophils Absolute: 0.2 K/uL (ref 0.0–0.5)
Eosinophils Relative: 6 %
HCT: 32.7 % — ABNORMAL LOW (ref 39.0–52.0)
Hemoglobin: 10 g/dL — ABNORMAL LOW (ref 13.0–17.0)
Immature Granulocytes: 0 %
Lymphocytes Relative: 7 %
Lymphs Abs: 0.3 K/uL — ABNORMAL LOW (ref 0.7–4.0)
MCH: 28.8 pg (ref 26.0–34.0)
MCHC: 30.6 g/dL (ref 30.0–36.0)
MCV: 94.2 fL (ref 80.0–100.0)
Monocytes Absolute: 0.6 K/uL (ref 0.1–1.0)
Monocytes Relative: 15 %
Neutro Abs: 2.6 K/uL (ref 1.7–7.7)
Neutrophils Relative %: 71 %
Platelets: 246 K/uL (ref 150–400)
RBC: 3.47 MIL/uL — ABNORMAL LOW (ref 4.22–5.81)
RDW: 14.8 % (ref 11.5–15.5)
WBC: 3.7 K/uL — ABNORMAL LOW (ref 4.0–10.5)
nRBC: 0 % (ref 0.0–0.2)

## 2024-02-01 LAB — COMPREHENSIVE METABOLIC PANEL WITH GFR
ALT: 46 U/L — ABNORMAL HIGH (ref 0–44)
AST: 35 U/L (ref 15–41)
Albumin: 3.7 g/dL (ref 3.5–5.0)
Alkaline Phosphatase: 86 U/L (ref 38–126)
Anion gap: 13 (ref 5–15)
BUN: 32 mg/dL — ABNORMAL HIGH (ref 8–23)
CO2: 27 mmol/L (ref 22–32)
Calcium: 9.4 mg/dL (ref 8.9–10.3)
Chloride: 101 mmol/L (ref 98–111)
Creatinine, Ser: 0.59 mg/dL — ABNORMAL LOW (ref 0.61–1.24)
GFR, Estimated: >60 mL/min (ref >60)
Glucose, Bld: 165 mg/dL — ABNORMAL HIGH (ref 70–99)
Potassium: 4.4 mmol/L (ref 3.5–5.1)
Sodium: 141 mmol/L (ref 135–145)
Total Bilirubin: 0.3 mg/dL (ref 0.0–1.2)
Total Protein: 6.8 g/dL (ref 6.5–8.1)

## 2024-02-01 LAB — APTT: aPTT: 32 s (ref 24–36)

## 2024-02-01 LAB — GLUCOSE, CAPILLARY: Glucose-Capillary: 122 mg/dL — ABNORMAL HIGH (ref 70–99)

## 2024-02-01 LAB — PROTIME-INR
INR: 1.1 (ref 0.8–1.2)
Prothrombin Time: 14.6 s (ref 11.4–15.2)

## 2024-02-01 LAB — TROPONIN T, HIGH SENSITIVITY
Troponin T High Sensitivity: 25 ng/L — ABNORMAL HIGH (ref 0–19)
Troponin T High Sensitivity: 24 ng/L — ABNORMAL HIGH (ref 0–19)

## 2024-02-01 LAB — LACTIC ACID, PLASMA: Lactic Acid, Venous: 1.3 mmol/L (ref 0.5–1.9)

## 2024-02-01 LAB — PROCALCITONIN: Procalcitonin: <0.1 ng/mL

## 2024-02-01 LAB — MAGNESIUM: Magnesium: 1.6 mg/dL — ABNORMAL LOW (ref 1.7–2.4)

## 2024-02-01 LAB — HEPARIN LEVEL (UNFRACTIONATED): Heparin Unfractionated: 0.19 [IU]/mL — ABNORMAL LOW (ref 0.30–0.70)

## 2024-02-01 MED ORDER — SODIUM CHLORIDE 0.9 % IV BOLUS
1000.0000 mL | Freq: Once | INTRAVENOUS | Status: AC
  Administered 2024-02-01: 15:00:00 1000 mL via INTRAVENOUS

## 2024-02-01 MED ORDER — MAGNESIUM SULFATE 2 GM/50ML IV SOLN
2.0000 g | Freq: Once | INTRAVENOUS | Status: AC
  Administered 2024-02-01: 15:00:00 2 g via INTRAVENOUS
  Filled 2024-02-01: qty 50

## 2024-02-01 MED ORDER — SODIUM CHLORIDE 0.9 % IV SOLN: INTRAVENOUS | Status: AC

## 2024-02-01 MED ORDER — PANTOPRAZOLE SODIUM 40 MG PO TBEC
40.0000 mg | DELAYED_RELEASE_TABLET | Freq: Every day | ORAL | Status: DC
  Administered 2024-02-01 – 2024-02-03 (×3): 40 mg via ORAL
  Filled 2024-02-01 (×3): qty 1

## 2024-02-01 MED ORDER — HEPARIN (PORCINE) 25000 UT/250ML-% IV SOLN
1350.0000 [IU]/h | INTRAVENOUS | Status: DC
  Administered 2024-02-01: 19:00:00 1250 [IU]/h via INTRAVENOUS
  Filled 2024-02-01: qty 250

## 2024-02-01 MED ORDER — CHLORHEXIDINE GLUCONATE CLOTH 2 % EX PADS
6.0000 | MEDICATED_PAD | Freq: Every day | CUTANEOUS | Status: DC
  Administered 2024-02-03: 09:00:00 6 via TOPICAL

## 2024-02-01 MED ORDER — AMIODARONE LOAD VIA INFUSION
150.0000 mg | Freq: Once | INTRAVENOUS | Status: AC
  Administered 2024-02-01: 16:00:00 150 mg via INTRAVENOUS
  Filled 2024-02-01: qty 83.34

## 2024-02-01 MED ORDER — SODIUM CHLORIDE 0.9 % IV BOLUS
1000.0000 mL | Freq: Once | INTRAVENOUS | Status: AC
  Administered 2024-02-01: 20:00:00 1000 mL via INTRAVENOUS

## 2024-02-01 MED ORDER — OXYCODONE HCL 5 MG/5ML PO SOLN
5.0000 mg | Freq: Once | ORAL | Status: AC
  Administered 2024-02-01: 15:00:00 5 mg

## 2024-02-01 MED ORDER — INSULIN ASPART 100 UNIT/ML IJ SOLN: 0.0000 [IU] | Freq: Every day | INTRAMUSCULAR | Status: DC

## 2024-02-01 MED ORDER — INSULIN ASPART 100 UNIT/ML IJ SOLN
0.0000 [IU] | Freq: Three times a day (TID) | INTRAMUSCULAR | Status: DC
  Administered 2024-02-02 – 2024-02-03 (×2): 1 [IU] via SUBCUTANEOUS
  Filled 2024-02-01 (×2): qty 1

## 2024-02-01 MED ORDER — LEVOTHYROXINE SODIUM 100 MCG PO TABS
200.0000 ug | ORAL_TABLET | Freq: Every day | ORAL | Status: DC
  Administered 2024-02-02 – 2024-02-03 (×2): 200 ug via ORAL
  Filled 2024-02-01 (×2): qty 2

## 2024-02-01 MED ORDER — AMIODARONE HCL IN DEXTROSE 360-4.14 MG/200ML-% IV SOLN
30.0000 mg/h | INTRAVENOUS | Status: DC
  Administered 2024-02-01 – 2024-02-02 (×2): 30 mg/h via INTRAVENOUS
  Filled 2024-02-01: qty 200

## 2024-02-01 MED ORDER — METOPROLOL TARTRATE 25 MG PO TABS
25.0000 mg | ORAL_TABLET | Freq: Once | ORAL | Status: AC
  Administered 2024-02-01: 15:00:00 25 mg via ORAL
  Filled 2024-02-01: qty 1

## 2024-02-01 MED ORDER — GLUCERNA 1.5 CAL PO LIQD
900.0000 mL | ORAL | Status: DC
  Filled 2024-02-01 (×2): qty 948

## 2024-02-01 MED ORDER — SODIUM CHLORIDE 0.9 % IV BOLUS
500.0000 mL | Freq: Once | INTRAVENOUS | Status: AC
  Administered 2024-02-01: 17:00:00 500 mL via INTRAVENOUS

## 2024-02-01 MED ORDER — SODIUM CHLORIDE 0.9 % IV BOLUS
500.0000 mL | Freq: Once | INTRAVENOUS | Status: AC
  Administered 2024-02-01: 16:00:00 500 mL via INTRAVENOUS

## 2024-02-01 MED ORDER — CITALOPRAM HYDROBROMIDE 20 MG PO TABS
20.0000 mg | ORAL_TABLET | Freq: Every day | ORAL | Status: DC
  Administered 2024-02-01 – 2024-02-03 (×3): 20 mg via ORAL
  Filled 2024-02-01 (×3): qty 1

## 2024-02-01 MED ORDER — METOPROLOL TARTRATE 5 MG/5ML IV SOLN
5.0000 mg | Freq: Once | INTRAVENOUS | Status: AC
  Administered 2024-02-01: 15:00:00 5 mg via INTRAVENOUS
  Filled 2024-02-01: qty 5

## 2024-02-01 MED ORDER — LORAZEPAM 0.5 MG PO TABS
0.5000 mg | ORAL_TABLET | Freq: Two times a day (BID) | ORAL | Status: DC
  Administered 2024-02-01 – 2024-02-03 (×4): 0.5 mg via ORAL
  Filled 2024-02-01 (×4): qty 1

## 2024-02-01 MED ORDER — MIRTAZAPINE 15 MG PO TABS
15.0000 mg | ORAL_TABLET | Freq: Every day | ORAL | Status: DC
  Administered 2024-02-01 – 2024-02-02 (×2): 15 mg via ORAL
  Filled 2024-02-01 (×2): qty 1

## 2024-02-01 MED ORDER — GLUCERNA 1.2 CAL PO LIQD
900.0000 mL | ORAL | Status: DC
  Administered 2024-02-01: 23:00:00 900 mL

## 2024-02-01 MED ORDER — AMIODARONE HCL IN DEXTROSE 360-4.14 MG/200ML-% IV SOLN
60.0000 mg/h | INTRAVENOUS | Status: DC
  Administered 2024-02-01 (×2): 60 mg/h via INTRAVENOUS
  Filled 2024-02-01 (×2): qty 200

## 2024-02-01 MED ORDER — POLYETHYLENE GLYCOL 3350 17 G PO PACK
17.0000 g | PACK | Freq: Every day | ORAL | Status: DC | PRN
  Administered 2024-02-02: 04:00:00 17 g via ORAL
  Filled 2024-02-01: qty 1

## 2024-02-01 MED ORDER — ATORVASTATIN CALCIUM 40 MG PO TABS
40.0000 mg | ORAL_TABLET | Freq: Every day | ORAL | Status: DC
  Administered 2024-02-01 – 2024-02-03 (×3): 40 mg via ORAL
  Filled 2024-02-01 (×3): qty 1

## 2024-02-01 NOTE — Progress Notes (Addendum)
 Patient refused CPAP and stated if he need it he would call his wife t bring his from home. But at this time,he was fine without it.
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
## 2024-02-01 NOTE — ED Triage Notes (Signed)
 Pt was brought down from the cancer center. Pt came in because he has not been feeling well since his radiation about 3 weeks ago. Pt does have a history or afib and is on feedings.

## 2024-02-01 NOTE — Progress Notes (Signed)
 1330 patient came in today for labs and doctor visit today. Blood pressure low, patient states he has been nauseated and vomited last night. Heart rate in the 140's to 150's. [place] [employer] PA notified. Plain normal saline bolus started per orders. EKG ordered and done. Per provider will send pt down to the emergency room. PA called report to ER charge nurse.   Transported pt down to the emergency room with IVF going, vitals stable , room air with Patient's wife at side via wheelchair.
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
## 2024-02-01 NOTE — ED Provider Notes (Signed)
 [place] EMERGENCY DEPARTMENT AT [facility] Provider Note   CSN: [id number] Arrival date & time: 02/01/24  1418     Patient presents with: Fatigue   Samuel Hubbard is a 68 y.o. male.  {Add pertinent medical, surgical, social history, OB history to YEP:67052} Patient has a history of esophageal cancer and has a PEG tube.  He has received radiation and chemotherapy.  Patient is also had surgery to remove the distal part of his esophagus.  He states he has felt weak today but was supposed to see you the oncologist.  When he arrived.  He was tachycardic and hypotensive.   Weakness      Prior to Admission medications  Medication Sig Start Date End Date Taking? Authorizing Provider  apixaban  (ELIQUIS ) 5 MG TABS tablet Take 5 mg by mouth 2 (two) times daily.    [provider]  atorvastatin  (LIPITOR) 40 MG tablet Take 1 tablet (40 mg total) by mouth daily. 01/11/24   Tobie Suzzane POUR, MD  blood glucose meter kit and supplies Dispense based on patient and insurance preference. Once daily testing DX E11.9 11/06/21   Paseda, Folashade R, FNP  citalopram  (CELEXA ) 20 MG tablet Take 1 tablet (20 mg total) by mouth daily. 12/07/23   [name], MD  dexamethasone  (DECADRON ) 4 MG tablet Take 2 tablets daily for 2 days, start the day after chemotherapy. Take with food. 11/02/23   Kandala, Hyndavi, MD  glucose blood test strip Use as instructed 11/06/21   Paseda, Folashade R, FNP  Lancets 30G MISC Once daily testing dx e11.9 11/06/21   Paseda, Folashade R, FNP  levothyroxine  (SYNTHROID ) 200 MCG tablet Take 1 tablet (200 mcg total) by mouth daily. 01/13/24   Tobie Suzzane POUR, MD  lidocaine -prilocaine  (EMLA ) cream Apply to affected area once 11/02/23   Kandala, Hyndavi, MD  LORazepam  (ATIVAN ) 0.5 MG tablet Take 1 tablet (0.5 mg total) by mouth 2 (two) times daily. 01/20/24   Kandala, Hyndavi, MD  magnesium  oxide (MAG-OX) 400 (240 Mg) MG tablet Take 1 tablet (400 mg total) by mouth  daily. 12/17/23   Kandala, Hyndavi, MD  metFORMIN  (GLUCOPHAGE ) 500 MG tablet Take 2 tablets (1,000 mg total) by mouth 2 (two) times daily with a meal. 09/09/23   Bevely Doffing, FNP  metoprolol  tartrate (LOPRESSOR ) 25 MG tablet Take 25 mg by mouth 2 (two) times daily.    [provider]  mirtazapine  (REMERON ) 15 MG tablet Take 1 tablet (15 mg total) by mouth at bedtime. 01/20/24   Kandala, Hyndavi, MD  nitroGLYCERIN  (NITROSTAT ) 0.4 MG SL tablet Place 1 tablet (0.4 mg total) under the tongue every 5 (five) minutes as needed for chest pain. 06/06/22   Del Orbe Polanco, Hilario, FNP  olmesartan  (BENICAR ) 40 MG tablet TAKE 1 TABLET BY MOUTH EVERY DAY 08/31/23   Bevely Doffing, FNP  omeprazole  (PRILOSEC) 40 MG capsule Take 1 capsule (40 mg total) by mouth 2 (two) times daily. Swallow granules 09/08/23   Eartha Angelia Sieving, MD  oxyCODONE  (ROXICODONE ) 5 MG/5ML solution Take 5 mLs (5 mg total) by mouth every 4 (four) hours as needed for severe pain (pain score 7-10). 01/21/24   Geofm Delon BRAVO, NP  prochlorperazine  (COMPAZINE ) 10 MG tablet TAKE 1 TABLET BY MOUTH EVERY 6 HOURS AS NEEDED FOR NAUSEA AND VOMITING 11/30/23   [name], MD  sucralfate  (CARAFATE ) 1 g tablet 1 g by Enteral route. 11/06/23 11/05/24  [provider]  TYLENOL  CHILDRENS 160 MG/5ML suspension TAKE 20.3 ML  BY MOUTH EVERY 6 HOURS AS NEEDED 10/03/23   [provider]    Allergies: Other, Wasp venom, and Zolpidem    Review of Systems  Neurological:  Positive for weakness.    Updated Vital Signs BP 95/72   Pulse (!) 112   Temp 97.6 F (36.4 C) (Oral)   Resp (!) 22   Ht 6' (1.829 m)   Wt 90.7 kg   SpO2 95%   BMI 27.12 kg/m   Physical Exam  (all labs ordered are listed, but only abnormal results are displayed) Labs Reviewed  TROPONIN T, HIGH SENSITIVITY - Abnormal; Notable for the following components:      Result Value   Troponin T High Sensitivity 25 (*)    All other components within  normal limits  LACTIC ACID, PLASMA  PROCALCITONIN  TROPONIN T, HIGH SENSITIVITY    EKG: None  Radiology: CT Cervical Spine Wo Contrast Result Date: 02/01/2024 EXAM: CT CERVICAL SPINE WITHOUT CONTRAST 02/01/2024 03:27:16 PM TECHNIQUE: CT of the cervical spine was performed without the administration of intravenous contrast. Multiplanar reformatted images are provided for review. Automated exposure control, iterative reconstruction, and/or weight based adjustment of the mA/kV was utilized to reduce the radiation dose to as low as reasonably achievable. COMPARISON: None available. CLINICAL HISTORY: Neck trauma (Age >= 65y) FINDINGS: BONES AND ALIGNMENT: Alignment is maintained. Partially visualized right adjacent central venous catheter. No acute fracture or traumatic malalignment. DEGENERATIVE CHANGES: Mild degenerative endplate osteophytes. Arthrosis at multiple levels most pronounced at C6-C7. Facet arthrosis at multiple levels. Disc bulge at C3-C4 results in mild spinal canal stenosis. Disc bulge at C5-C6 results in mild spinal canal stenosis. SOFT TISSUES: No prevertebral soft tissue swelling. IMPRESSION: 1. No evidence of acute traumatic injury. 2. Degenerative changes as above. Electronically signed by: Donnice Mania MD 02/01/2024 03:44 PM EST RP Workstation: [id number]   CT Head Wo Contrast Result Date: 02/01/2024 EXAM: CT HEAD WITHOUT 02/01/2024 03:27:16 PM TECHNIQUE: CT of the head was performed without the administration of intravenous contrast. Automated exposure control, iterative reconstruction, and/or weight based adjustment of the mA/kV was utilized to reduce the radiation dose to as low as reasonably achievable. COMPARISON: None available. CLINICAL HISTORY: Head trauma, intracranial arterial injury suspected. FINDINGS: BRAIN AND VENTRICLES: No acute intracranial hemorrhage. No mass effect or midline shift. No extra-axial fluid collection. No evidence of acute infarct. No hydrocephalus.  Scattered periventricular white matter hypoattenuation, consistent with mild chronic ischemic microvascular disease. Remote lacunar infarct in the left subinsular region. Calcified atherosclerotic plaque in cavernous/supraclinoid internal carotid arteries. ORBITS: Right lens replacement noted. SINUSES AND MASTOIDS: Polypoid mucosal thickening in right maxillary sinus. SOFT TISSUES AND SKULL: No acute skull fracture. No acute soft tissue abnormality. IMPRESSION: 1. No acute intracranial abnormality. 2. Remote lacunar infarct in the left subinsular region. 3. Mild chronic ischemic microvascular disease. Electronically signed by: Donnice Mania MD 02/01/2024 03:36 PM EST RP Workstation: [id number]   DG Chest Port 1 View Result Date: 02/01/2024 CLINICAL DATA:  Shortness of breath. EXAM: PORTABLE CHEST 1 VIEW COMPARISON:  Chest radiograph dated 09/08/2023 FINDINGS: Right-sided Port-A-Cath with tip over central SVC. No focal consolidation, pleural effusion, pneumothorax. The cardiac silhouette is within normal limits. No acute osseous pathology. IMPRESSION: No active disease. Electronically Signed   By: Vanetta Chou M.D.   On: 02/01/2024 15:13   DG Shoulder Right Result Date: 02/01/2024 EXAM: 1 VIEW(S) XRAY OF THE RIGHT SHOULDER 02/01/2024 03:02:00 PM COMPARISON: None available. CLINICAL HISTORY: sob sob FINDINGS: BONES AND JOINTS: Degenerative  changes in the right shoulder involving glenohumeral and acromioclavicular joints with moderate subacromial spur formation. No evidence of acute fracture or dislocation. No focal bone lesion or bone destruction. SOFT TISSUES: Power port type central venous catheter with tip projecting over the low SVC region. No abnormal calcifications. Visualized lung is unremarkable. IMPRESSION: 1. No acute fracture or dislocation. 2. Degenerative changes in the right shoulder involving the glenohumeral and acromioclavicular joints with moderate subacromial spur formation.  Electronically signed by: Elsie Gravely MD 02/01/2024 03:12 PM EST RP Workstation: [id number]    {Document cardiac monitor, telemetry assessment procedure when appropriate:32947} Procedures   Medications Ordered in the ED  amiodarone  (NEXTERONE ) 1.8 mg/mL load via infusion 150 mg (150 mg Intravenous Bolus from Bag 02/01/24 1611)    Followed by  amiodarone  (NEXTERONE  PREMIX) 360-4.14 MG/200ML-% (1.8 mg/mL) IV infusion (60 mg/hr Intravenous New Bag/Given 02/01/24 1615)    Followed by  amiodarone  (NEXTERONE  PREMIX) 360-4.14 MG/200ML-% (1.8 mg/mL) IV infusion (has no administration in time range)  sodium chloride  0.9 % bolus 500 mL (has no administration in time range)  sodium chloride  0.9 % bolus 1,000 mL (1,000 mLs Intravenous Bolus 02/01/24 1433)  metoprolol  tartrate (LOPRESSOR ) injection 5 mg (5 mg Intravenous Given 02/01/24 1435)  magnesium  sulfate IVPB 2 g 50 mL (0 g Intravenous Stopped 02/01/24 1535)  oxyCODONE  (ROXICODONE ) 5 MG/5ML solution 5 mg (5 mg Per Tube Given 02/01/24 1450)  metoprolol  tartrate (LOPRESSOR ) tablet 25 mg (25 mg Oral Given 02/01/24 1450)  sodium chloride  0.9 % bolus 500 mL (500 mLs Intravenous New Bag/Given 02/01/24 1603)  CRITICAL CARE Performed by: Fairy Sermon Total critical care time: 45 minutes Critical care time was exclusive of separately billable procedures and treating other patients. Critical care was necessary to treat or prevent imminent or life-threatening deterioration. Critical care was time spent personally by me on the following activities: development of treatment plan with patient and/or surrogate as well as nursing, discussions with consultants, evaluation of patient's response to treatment, examination of patient, obtaining history from patient or surrogate, ordering and performing treatments and interventions, ordering and review of laboratory studies, ordering and review of radiographic studies, pulse oximetry and re-evaluation of patient's  condition.   Patient was initially hypotensive and rapid atrial fibs.  He was given a couple liters of normal saline and then some Lopressor  IV and p.o.  His rate improved and his blood pressure improved.  Later on he started to get tachycardic again at about 130 with a blood pressure in the 90s.  I spoke with cardiology and they do not recommend cardioversion.  Dr. Alvan of the cardiologist is going to start amiodarone .  The patient will be admitted to medicine    {Click here for ABCD2, HEART and other calculators REFRESH Note before signing:1}                              Medical Decision Making Amount and/or Complexity of Data Reviewed Radiology: ordered.  Risk Prescription drug management.   Rapid atrial fibs with hypotension.  Patient will be admitted to medicine on amiodarone  and cardiology will consult  {Document critical care time when appropriate  Document review of labs and clinical decision tools ie CHADS2VASC2, etc  Document your independent review of radiology images and any outside records  Document your discussion with family members, caretakers and with consultants  Document social determinants of health affecting pt's care  Document your decision making why or why not  admission, treatments were needed:32947:::1}   Final diagnoses:  None    ED Discharge Orders     None

## 2024-02-01 NOTE — ED Notes (Signed)
 Patient transported to CT

## 2024-02-01 NOTE — Progress Notes (Signed)
 Visit was not complete due to brief evaluation and recommendation to be sent to ER. I was notified that patient was tachycardic (HR in 140's) and hypotensive (BP 87/63). Wife stated that patient was lethargic today and not administering enough fluids through the feeding tube during the day. EKG showed A fib with RVR and nonspecific ST abnormality.Patient was transferred to ER. I called ER charge nurse to give report.
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
## 2024-02-02 ENCOUNTER — Observation Stay (HOSPITAL_COMMUNITY)

## 2024-02-02 ENCOUNTER — Other Ambulatory Visit (HOSPITAL_COMMUNITY): Payer: Self-pay | Admitting: *Deleted

## 2024-02-02 ENCOUNTER — Encounter: Payer: Self-pay | Admitting: Oncology

## 2024-02-02 DIAGNOSIS — E440 Moderate protein-calorie malnutrition: Secondary | ICD-10-CM | POA: Diagnosis present

## 2024-02-02 DIAGNOSIS — Z7984 Long term (current) use of oral hypoglycemic drugs: Secondary | ICD-10-CM | POA: Diagnosis not present

## 2024-02-02 DIAGNOSIS — E1165 Type 2 diabetes mellitus with hyperglycemia: Secondary | ICD-10-CM | POA: Diagnosis present

## 2024-02-02 DIAGNOSIS — K219 Gastro-esophageal reflux disease without esophagitis: Secondary | ICD-10-CM | POA: Diagnosis present

## 2024-02-02 DIAGNOSIS — E785 Hyperlipidemia, unspecified: Secondary | ICD-10-CM | POA: Diagnosis present

## 2024-02-02 DIAGNOSIS — Z8501 Personal history of malignant neoplasm of esophagus: Secondary | ICD-10-CM | POA: Diagnosis not present

## 2024-02-02 DIAGNOSIS — R296 Repeated falls: Secondary | ICD-10-CM | POA: Diagnosis present

## 2024-02-02 DIAGNOSIS — I480 Paroxysmal atrial fibrillation: Secondary | ICD-10-CM | POA: Diagnosis present

## 2024-02-02 DIAGNOSIS — Z801 Family history of malignant neoplasm of trachea, bronchus and lung: Secondary | ICD-10-CM | POA: Diagnosis not present

## 2024-02-02 DIAGNOSIS — Z7901 Long term (current) use of anticoagulants: Secondary | ICD-10-CM | POA: Diagnosis not present

## 2024-02-02 DIAGNOSIS — E860 Dehydration: Secondary | ICD-10-CM | POA: Diagnosis present

## 2024-02-02 DIAGNOSIS — G4733 Obstructive sleep apnea (adult) (pediatric): Secondary | ICD-10-CM | POA: Diagnosis present

## 2024-02-02 DIAGNOSIS — I10 Essential (primary) hypertension: Secondary | ICD-10-CM | POA: Diagnosis present

## 2024-02-02 DIAGNOSIS — D649 Anemia, unspecified: Secondary | ICD-10-CM | POA: Diagnosis present

## 2024-02-02 DIAGNOSIS — Z79890 Hormone replacement therapy (postmenopausal): Secondary | ICD-10-CM | POA: Diagnosis not present

## 2024-02-02 DIAGNOSIS — Z8249 Family history of ischemic heart disease and other diseases of the circulatory system: Secondary | ICD-10-CM | POA: Diagnosis not present

## 2024-02-02 DIAGNOSIS — F32A Depression, unspecified: Secondary | ICD-10-CM | POA: Diagnosis present

## 2024-02-02 DIAGNOSIS — I4891 Unspecified atrial fibrillation: Secondary | ICD-10-CM | POA: Diagnosis present

## 2024-02-02 DIAGNOSIS — E861 Hypovolemia: Secondary | ICD-10-CM | POA: Diagnosis present

## 2024-02-02 DIAGNOSIS — Z931 Gastrostomy status: Secondary | ICD-10-CM | POA: Diagnosis not present

## 2024-02-02 DIAGNOSIS — K5900 Constipation, unspecified: Secondary | ICD-10-CM | POA: Diagnosis present

## 2024-02-02 DIAGNOSIS — Z803 Family history of malignant neoplasm of breast: Secondary | ICD-10-CM | POA: Diagnosis not present

## 2024-02-02 DIAGNOSIS — Z9221 Personal history of antineoplastic chemotherapy: Secondary | ICD-10-CM | POA: Diagnosis not present

## 2024-02-02 DIAGNOSIS — E039 Hypothyroidism, unspecified: Secondary | ICD-10-CM | POA: Diagnosis present

## 2024-02-02 DIAGNOSIS — Z923 Personal history of irradiation: Secondary | ICD-10-CM | POA: Diagnosis not present

## 2024-02-02 LAB — BASIC METABOLIC PANEL WITH GFR
Anion gap: 6 (ref 5–15)
BUN: 23 mg/dL (ref 8–23)
CO2: 31 mmol/L (ref 22–32)
Calcium: 8.1 mg/dL — ABNORMAL LOW (ref 8.9–10.3)
Chloride: 103 mmol/L (ref 98–111)
Creatinine, Ser: 0.47 mg/dL — ABNORMAL LOW (ref 0.61–1.24)
GFR, Estimated: >60 mL/min (ref >60)
Glucose, Bld: 202 mg/dL — ABNORMAL HIGH (ref 70–99)
Potassium: 3.9 mmol/L (ref 3.5–5.1)
Sodium: 140 mmol/L (ref 135–145)

## 2024-02-02 LAB — GLUCOSE, CAPILLARY
Glucose-Capillary: 125 mg/dL — ABNORMAL HIGH (ref 70–99)
Glucose-Capillary: 146 mg/dL — ABNORMAL HIGH (ref 70–99)
Glucose-Capillary: 108 mg/dL — ABNORMAL HIGH (ref 70–99)
Glucose-Capillary: 135 mg/dL — ABNORMAL HIGH (ref 70–99)

## 2024-02-02 LAB — HEPARIN LEVEL (UNFRACTIONATED)
Heparin Unfractionated: 0.23 [IU]/mL — ABNORMAL LOW (ref 0.30–0.70)
Heparin Unfractionated: >1.1 [IU]/mL — ABNORMAL HIGH (ref 0.30–0.70)

## 2024-02-02 LAB — IRON AND TIBC
Iron: 40 ug/dL — ABNORMAL LOW (ref 45–182)
Saturation Ratios: 14 % — ABNORMAL LOW (ref 17.9–39.5)
TIBC: 281 ug/dL (ref 250–450)
UIBC: 241 ug/dL

## 2024-02-02 LAB — FERRITIN: Ferritin: 624 ng/mL — ABNORMAL HIGH (ref 24–336)

## 2024-02-02 LAB — APTT: aPTT: 77 s — ABNORMAL HIGH (ref 24–36)

## 2024-02-02 LAB — CBC
HCT: 25.5 % — ABNORMAL LOW (ref 39.0–52.0)
Hemoglobin: 7.6 g/dL — ABNORMAL LOW (ref 13.0–17.0)
MCH: 28.5 pg (ref 26.0–34.0)
MCHC: 29.8 g/dL — ABNORMAL LOW (ref 30.0–36.0)
MCV: 95.5 fL (ref 80.0–100.0)
Platelets: 171 K/uL (ref 150–400)
RBC: 2.67 MIL/uL — ABNORMAL LOW (ref 4.22–5.81)
RDW: 15 % (ref 11.5–15.5)
WBC: 3 K/uL — ABNORMAL LOW (ref 4.0–10.5)
nRBC: 0 % (ref 0.0–0.2)

## 2024-02-02 LAB — RETICULOCYTES
Immature Retic Fract: 16.8 % — ABNORMAL HIGH (ref 2.3–15.9)
RBC.: 2.84 MIL/uL — ABNORMAL LOW (ref 4.22–5.81)
Retic Count, Absolute: 47.7 K/uL (ref 19.0–186.0)
Retic Ct Pct: 1.7 % (ref 0.4–3.1)

## 2024-02-02 LAB — CORTISOL-AM, BLOOD: Cortisol - AM: 8.9 ug/dL (ref 6.7–22.6)

## 2024-02-02 LAB — FOLATE: Folate: >20 ng/mL (ref >5.9)

## 2024-02-02 LAB — VITAMIN B12: Vitamin B-12: 508 pg/mL (ref 180–914)

## 2024-02-02 LAB — ECHOCARDIOGRAM COMPLETE
AR max vel: 2.52 cm2
AV Area VTI: 2.71 cm2
AV Area mean vel: 2.75 cm2
AV Mean grad: 5 mmHg
AV Peak grad: 9.7 mmHg
Ao pk vel: 1.56 m/s
Area-P 1/2: 3.08 cm2
Height: 72 in
S' Lateral: 3.2 cm
Weight: 3209.9 [oz_av]

## 2024-02-02 LAB — MRSA NEXT GEN BY PCR, NASAL: MRSA by PCR Next Gen: NOT DETECTED

## 2024-02-02 LAB — HIV ANTIBODY (ROUTINE TESTING W REFLEX): HIV Screen 4th Generation wRfx: NONREACTIVE

## 2024-02-02 LAB — HEMOGLOBIN AND HEMATOCRIT, BLOOD
HCT: 27.3 % — ABNORMAL LOW (ref 39.0–52.0)
Hemoglobin: 8.3 g/dL — ABNORMAL LOW (ref 13.0–17.0)

## 2024-02-02 MED ORDER — IRON SUCROSE 200 MG IVPB - SIMPLE MED
200.0000 mg | Freq: Once | Status: AC
  Administered 2024-02-02: 16:00:00 200 mg via INTRAVENOUS
  Filled 2024-02-02: qty 200

## 2024-02-02 MED ORDER — APIXABAN 5 MG PO TABS: 5.0000 mg | ORAL_TABLET | Freq: Two times a day (BID) | ORAL | Status: DC

## 2024-02-02 MED ORDER — ONDANSETRON 4 MG PO TBDP
4.0000 mg | ORAL_TABLET | Freq: Three times a day (TID) | ORAL | Status: DC | PRN
  Administered 2024-02-02: 12:00:00 4 mg via ORAL
  Filled 2024-02-02: qty 1

## 2024-02-02 MED ORDER — GLUCERNA 1.2 CAL PO LIQD: 1520.0000 mL | ORAL | Status: DC

## 2024-02-02 MED ORDER — GLUCERNA 1.5 CAL PO LIQD
1520.0000 mL | ORAL | Status: DC
  Administered 2024-02-02: 18:00:00 1520 mL
  Filled 2024-02-02: qty 1659
  Filled 2024-02-02: qty 2000
  Filled 2024-02-02: qty 1659

## 2024-02-02 MED ORDER — AMIODARONE HCL 200 MG PO TABS
200.0000 mg | ORAL_TABLET | Freq: Two times a day (BID) | ORAL | Status: DC
  Administered 2024-02-02 – 2024-02-03 (×3): 200 mg via ORAL
  Filled 2024-02-02 (×3): qty 1

## 2024-02-02 MED ORDER — FREE WATER
180.0000 mL | Freq: Every day | Status: DC
  Administered 2024-02-02 – 2024-02-03 (×4): 180 mL

## 2024-02-02 MED ORDER — SODIUM CHLORIDE 0.9 % IV BOLUS
500.0000 mL | Freq: Once | INTRAVENOUS | Status: AC
  Administered 2024-02-02: 11:00:00 500 mL via INTRAVENOUS

## 2024-02-02 MED ORDER — ADULT MULTIVITAMIN W/MINERALS CH
1.0000 | ORAL_TABLET | Freq: Every day | ORAL | Status: DC
  Administered 2024-02-03: 09:00:00 1
  Filled 2024-02-02: qty 1

## 2024-02-02 MED ORDER — APIXABAN 5 MG PO TABS
5.0000 mg | ORAL_TABLET | Freq: Two times a day (BID) | ORAL | Status: DC
  Administered 2024-02-02 – 2024-02-03 (×3): 5 mg via ORAL
  Filled 2024-02-02 (×3): qty 1

## 2024-02-02 NOTE — Discharge Instructions (Signed)
 Home Tube Feeding Tube feeding formula:  Nutren 1.5  Intermittent feeding:  95 mL per hour from 6 pm to 10 am    Total amount of formula per day: 1500 mL per day Number of cartons per day: 6 Amount of water  for tube flushing : 180 mL 5 times per day Amount of extra water  to give daily: 900 mL        IMPORTANT INFORMATION: PAY CLOSE ATTENTION   PHYSICIAN DISCHARGE INSTRUCTIONS  Follow with Primary care provider  Tobie Suzzane POUR, MD  and other consultants as instructed by your Hospitalist Physician  SEEK MEDICAL CARE OR RETURN TO EMERGENCY ROOM IF SYMPTOMS COME BACK, WORSEN OR NEW PROBLEM DEVELOPS   Please note: You were cared for by a hospitalist during your hospital stay. Every effort will be made to forward records to your primary care provider.  You can request that your primary care provider send for your hospital records if they have not received them.  Once you are discharged, your primary care physician will handle any further medical issues. Please note that NO REFILLS for any discharge medications will be authorized once you are discharged, as it is imperative that you return to your primary care physician (or establish a relationship with a primary care physician if you do not have one) for your post hospital discharge needs so that they can reassess your need for medications and monitor your lab values.  Please get a complete blood count and chemistry panel checked by your Primary MD at your next visit, and again as instructed by your Primary MD.  Get Medicines reviewed and adjusted: Please take all your medications with you for your next visit with your Primary MD  Laboratory/radiological data: Please request your Primary MD to go over all hospital tests and procedure/radiological results at the follow up, please ask your primary care provider to get all Hospital records sent to his/her office.  In some cases, they will be blood work, cultures and biopsy results pending at  the time of your discharge. Please request that your primary care provider follow up on these results.  If you are diabetic, please bring your blood sugar readings with you to your follow up appointment with primary care.    Please call and make your follow up appointments as soon as possible.    Also Note the following: If you experience worsening of your admission symptoms, develop shortness of breath, life threatening emergency, suicidal or homicidal thoughts you must seek medical attention immediately by calling 911 or calling your MD immediately  if symptoms less severe.  You must read complete instructions/literature along with all the possible adverse reactions/side effects for all the Medicines you take and that have been prescribed to you. Take any new Medicines after you have completely understood and accpet all the possible adverse reactions/side effects.   Do not drive when taking Pain medications or sleeping medications (Benzodiazepines)  Do not take more than prescribed Pain, Sleep and Anxiety Medications. It is not advisable to combine anxiety,sleep and pain medications without talking with your primary care practitioner  Special Instructions: If you have smoked or chewed Tobacco  in the last 2 yrs please stop smoking, stop any regular Alcohol  and or any Recreational drug use.  Wear Seat belts while driving.  Do not drive if taking any narcotic, mind altering or controlled substances or recreational drugs or alcohol.

## 2024-02-02 NOTE — Evaluation (Signed)
 Occupational Therapy Evaluation Patient Details Name: Samuel Hubbard MRN: [id number] DOB: October 30, 1955 Today's Date: 02/02/2024   History of Present Illness   Samuel Hubbard is a 68 y.o. male with medical history significant of atrial fibrillation, diabetes mellitus, GERD, hypertension, hypothyroidism, OSA on CPAP, history of pheochromocytoma, hyperlipidemia, esophageal cancer who followed up with his oncologist today and in the office patient was found to be hypotensive with A-fib with RVR and therefore sent to the emergency room for further management.  According to the patient and and his wife, he has been having generalized weakness that has been ongoing since his last radiation therapy about a month ago.  He has been having increased sleepiness during the day and has also sustained  falls at home.  He denies chest pain, nausea vomiting abdominal pain or urinary complaints. (per MD)     Clinical Impressions Pt agreeable to OT and PT co-evaluation. Pt demonstrated mild labored effort but was able to complete functional transfers and ADL tasks without assist. Pt able to stand to urinate without assist. Pt demonstrates WFL B UE strength with no issues reported in ADL's. Pt left in the chair with call bell within reach. Pt is not recommended for any further acute OT services and will be discharged to care of nursing staff for remaining length of stay.               Functional Status Assessment   Patient has not had a recent decline in their functional status     Equipment Recommendations   None recommended by OT             Precautions/Restrictions   Precautions Precautions: Fall Recall of Precautions/Restrictions: Intact Restrictions Weight Bearing Restrictions Per Provider Order: No     Mobility Bed Mobility Overal bed mobility: Modified Independent             General bed mobility comments: Mild labored offort; no physical assist.    Transfers Overall  transfer level: Modified independent                 General transfer comment: Ambulatory without AD and holding on to IV pole some.      Balance Overall balance assessment: Mild deficits observed, not formally tested                                         ADL either performed or assessed with clinical judgement   ADL Overall ADL's : Independent;Modified independent                                       General ADL Comments: Pt reports no difficulty with dressing; able to ambualte in the hall holding IV pole. Independent per clinical judgement and observation.     Vision Baseline Vision/History: 1 Wears glasses (Recent cataracts surgery.) Ability to See in Adequate Light: 1 Impaired Patient Visual Report: No change from baseline Vision Assessment?: No apparent visual deficits     Perception Perception: Not tested       Praxis Praxis: Not tested       Pertinent Vitals/Pain Pain Assessment Pain Assessment: No/denies pain     Extremity/Trunk Assessment Upper Extremity Assessment Upper Extremity Assessment: Overall WFL for tasks assessed   Lower Extremity Assessment Lower Extremity Assessment: Defer to PT evaluation  Cervical / Trunk Assessment Cervical / Trunk Assessment: Normal   Communication Communication Communication: No apparent difficulties   Cognition Arousal: Alert Behavior During Therapy: WFL for tasks assessed/performed Cognition: No apparent impairments                               Following commands: Intact       Cueing  General Comments   Cueing Techniques: Verbal cues                 Home Living Family/patient expects to be discharged to:: Private residence Living Arrangements: Spouse/significant other Available Help at Discharge: Family;Available 24 hours/day Type of Home: House Home Access: Stairs to enter [employer] of Steps: 2 Entrance Stairs-Rails:  Left Home Layout: Two level Alternate Level Stairs-Number of Steps: 10 Alternate Level Stairs-Rails: Left Bathroom Shower/Tub: Chief Strategy Officer: Standard Bathroom Accessibility: Yes How Accessible: Accessible via wheelchair;Accessible via walker Home Equipment: None          Prior Functioning/Environment Prior Level of Function : Independent/Modified Independent             Mobility Comments: Tourist information centre manager without AD ADLs Comments: Independent                            Co-evaluation PT/OT/SLP Co-Evaluation/Treatment: Yes Reason for Co-Treatment: To address functional/ADL transfers   OT goals addressed during session: ADL's and self-care                       End of Session    Activity Tolerance: Patient tolerated treatment well Patient left: in chair;with call bell/phone within reach  OT Visit Diagnosis: Unsteadiness on feet (R26.81);Muscle weakness (generalized) (M62.81);History of falling (Z91.81)                Time: 9058-9042 OT Time Calculation (min): 16 min Charges:  OT General Charges $OT Visit: 1 Visit OT Evaluation $OT Eval Low Complexity: 1 Low  Samuel Hubbard OT, MOT  Samuel Hubbard 02/02/2024, 11:51 AM

## 2024-02-02 NOTE — Progress Notes (Signed)
 [facility] CSW Counseling Note  Patient was referred by self. Treatment type: Individual  Presenting Concerns: Patient and/or family reports the following symptoms/concerns: anxiety and depression Duration of problem: 6 months; Severity of problem: moderate   Orientation:oriented to person, place, time/date, situation, day of week, month of year, and year.   Affect: Appropriate and Congruent Risk of harm to self or others: No plan to harm self or others  Patient and/or Family's Strengths/Protective Factors: Social and Emotional competence and Concrete supports in place (healthy food, safe environments, etc.)Ability for insight  Motivation for treatment/growth  Supportive family/friends      Goals Addressed: Patient will:  Reduce symptoms of: anxiety and depression Increase knowledge and/or ability of: coping skills and self-management skills  Increase healthy adjustment to current life circumstances   Progress towards Goals: Initial   Interventions: Interventions utilized:  Solution-Focused Strategies and Supportive Counseling       01/20/2024    2:46 PM 01/12/2024    2:42 PM 12/07/2023    9:30 AM  PHQ9 SCORE ONLY  PHQ-9 Total Score 13 12 12        01/12/2024    2:42 PM 06/01/2023    2:46 PM 06/06/2022    3:31 PM 02/28/2020    4:29 PM  GAD 7 : Generalized Anxiety Score  Nervous, Anxious, on Edge 0 0 0 3  Control/stop worrying 0 0 0 3  Worry too much - different things 0 0 0 3  Trouble relaxing 0 0 0 2  Restless 0 0 0 0  Easily annoyed or irritable 0 0 0 3  Afraid - awful might happen 0 0 0 3  Total GAD 7 Score 0 0 0 17  Anxiety Difficulty Not difficult at all Not difficult at all Not difficult at all Somewhat difficult       Assessment: Patient currently experiencing depression and anxiety.  Pt expressed feelings of depression regarding his current life circumstances.  Pt originally planned to have surgical removal of cancer following first round of chemotherapy which  was aborted in the OR due to suspicious findings thought to be metastatic disease.  Pt then underwent concurrent chemo/radiation which he has completed.  Pt states following his initial chemo regiment he regained his energy and strength shortly after completing treatment.  Post most recent chemo pt reports feeling worse overall.  At this time pt states he does not know what the plan is and he is extremely concerned as he continues to lose wait.  Pt is fearful of what his outcome will be if his body is not able to start to rebound.  Pt initially had a follow up appt on 12/15 with medical oncologist; however, that appt was cancelled due to medical instability and pt was transferred to the ED.  CSW discussed strategies at length with pt regarding communicating with the medical team to get a clear understanding of what the plan will be moving forward.  Pt allowed space to express his feelings regarding his current life circumstances.  Emotional support provided.  CSW to continue to communicate w/ the medical team to ensure pt fully understands the plan moving forward.       Plan: Follow up with CSW: post medical oncology appt.        Devere JONELLE Manna, LCSW

## 2024-02-02 NOTE — Progress Notes (Signed)
 Initial Nutrition Assessment  DOCUMENTATION CODES:   Non-severe (moderate) malnutrition in context of chronic illness  INTERVENTION:  Continue tube feeding via J-tube while in the hospital: Change to Glucerna 1.5 at 95 ml/h x 16 hours (1520 ml per day).  Provides 2280 kcal, 126 gm protein, 1154 ml free water  daily. Free water  flushes 180 ml 5 times per day for a total of 2054 ml free water  daily. Continue liquids PO as tolerated.  Add MVI with minerals daily via tube or PO if he can tolerate.   Tube Feeding for home: Nutren 1.5, 6 containers per day, run at 95 ml/h x 16 hours via pump.  This provides 2280 kcal, 102 gm protein, 1146 ml free water  daily.  Free water  flushes 180 ml 5 times per day for a total of 2046 ml free water  daily.  Provided written home TF regimen to nurse to give to patient's wife when she returns tomorrow. Will also attach to d/c instructions.   NUTRITION DIAGNOSIS:  Moderate Malnutrition related to chronic illness (esophageal cancer, dysphagia) as evidenced by mild muscle depletion, mild fat depletion.  GOAL:  Patient will meet greater than or equal to 90% of their needs  MONITOR:  PO intake, TF tolerance  REASON FOR ASSESSMENT:  Consult Enteral/tube feeding initiation and management  ASSESSMENT:  68 yo male admitted with A fib with RVR. PMH includes esophageal cancer S/P chemo & radiation, A fib, DM, GERD, HTN, hypothyroidism, OSA, HLD, spinal stenosis, pheochromocytoma.  Spoke with patient and wife at bedside. Prior to cancer treatment, he weighed 260 lbs. He thinks he lost ~50 lbs during chemo and radiation. He has been extremely tired and this started ~ 1 month after completing radiation. Patient is only able to take small amounts of liquids PO at home. He mostly drinks water , green tea, and milk. He does not tolerate any solid foods. He has a J-tube and receives nocturnal TF with Nutren 1.5 at 75 ml/h x approximately 12 hours for a total of 5  containers per day. This provides 1900 kcal, 85 gm protein, 955 ml free water  daily. He flushes the tube before, during, and after his feedings with 120 ml water  for a total of 1315 ml free water  daily. He was doing 60 ml of water , but recently increased it to 120 ml d/t suspected dehydration. They are unsure why he has been so tired and sleepy lately, but wife thinks his lack of hydration may be contributing. He sees the cancer center RD who has been managing his tube feedings. He would like to take a MVI, but has trouble swallowing pills and crushing them is difficult. He had stopped taking his magnesium  supplement a short time before admission because they thought it may be causing his lethargy. Mag was low on admission, so he has received repletion.   Pharmacy has ordered Glucerna 1.5 to be available this afternoon.  Since patient has plenty Nutren 1.5 cartons at home, can continue this when he is discharged home. Provided written instructions for home TF to nurse to give to wife when she returns to the hospital tomorrow.   Labs reviewed.  Mag 1.6 CBG: 122-125  Medications reviewed and include novolog  SSI QID, levothyroxine , remeron , protonix . IVF: NS at 75 ml/h  Weight history reviewed.  02/02/23: 108.9 kg 11/09/23: 96.5 kg 12/07/23: 95.3 kg 01/07/24: 90.7 kg 02/01/24: 91 kg 6% weight loss within 3 months is not significant for the time frame; 16% weight loss over the past year is not  significant for the time frame.  Patient meets criteria for moderate malnutrition, given mild depletion of muscle and subcutaneous fat mass.  NUTRITION - FOCUSED PHYSICAL EXAM: Flowsheet Row Most Recent Value  Orbital Region No depletion  Upper Arm Region Mild depletion  Thoracic and Lumbar Region Mild depletion  Buccal Region Mild depletion  Temple Region Mild depletion  Clavicle Bone Region Moderate depletion  Clavicle and Acromion Bone Region Mild depletion  Scapular Bone Region Mild depletion   Dorsal Hand Mild depletion  Patellar Region No depletion  Anterior Thigh Region No depletion  Posterior Calf Region No depletion  Edema (RD Assessment) None  Hair Reviewed  Eyes Reviewed  Mouth Reviewed  Skin Reviewed  Nails Reviewed   Diet Order:   Diet Order             Diet clear liquid Room service appropriate? Yes; Fluid consistency: Thin  Diet effective now                   EDUCATION NEEDS:  Education needs have been addressed  Skin:  Skin Assessment: Reviewed RN Assessment  Last BM:  12/15  Height:  Ht Readings from Last 1 Encounters:  02/01/24 6' (1.829 m)    Weight:  Wt Readings from Last 1 Encounters:  02/01/24 91 kg   Ideal Body Weight:  80.9 kg  BMI:  Body mass index is 27.21 kg/m.  Estimated Nutritional Needs:  Kcal:  2200-2400  Protein:  110-130 gm  Fluid:  2.2-2.4 L    Suzen HUNT RD, LDN, CNSC Contact via secure chat. If unavailable, use group chat RD Inpatient.

## 2024-02-02 NOTE — Plan of Care (Signed)

## 2024-02-02 NOTE — TOC Initial Note (Signed)
 Transition of Care [facility]) - Initial/Assessment Note    Patient Details  Name: Samuel Hubbard MRN: [id number] Date of Birth: March 07, 1955  [facility] CM/SW Contact:    Lucie Lunger, LCSWA Phone Number: 02/02/2024, 1:51 PM  Clinical Narrative:                 CSW updated that PT is recommending HH PT for pt. CSW met with pt at bedside to complete assessment. Pt states that he and his wife live together. Pt states he is independent in completing his ADLs and able to drive when needed. Pt states he has HH currently, chart review reflects that [facility] agency is [facility]. CSW reached out to [facility] rep Shelia to confirm services. Pt states he does not use any DME. TOC to follow.   Expected Discharge Plan: Home w Home Health Services Barriers to Discharge: Continued Medical Work up   Patient Goals and CMS Choice Patient states their goals for this hospitalization and ongoing recovery are:: return home CMS Medicare.gov Compare Post Acute Care list provided to:: Patient Choice offered to / list presented to : Patient      Expected Discharge Plan and Services In-house Referral: Clinical Social Work Discharge Planning Services: CM Consult Post Acute Care Choice: Home Health Living arrangements for the past 2 months: Single Family Home                                      Prior Living Arrangements/Services Living arrangements for the past 2 months: Single Family Home Lives with:: Spouse Patient language and need for interpreter reviewed:: Yes Do you feel safe going back to the place where you live?: Yes      Need for Family Participation in Patient Care: Yes (Comment) Care giver support system in place?: Yes (comment)   Criminal Activity/Legal Involvement Pertinent to Current Situation/Hospitalization: No - Comment as needed  Activities of Daily Living   ADL Screening (condition at time of admission) Independently performs ADLs?: Yes (appropriate for developmental  age) Is the patient deaf or have difficulty hearing?: No Does the patient have difficulty seeing, even when wearing glasses/contacts?: No Does the patient have difficulty concentrating, remembering, or making decisions?: No  Permission Sought/Granted                  Emotional Assessment Appearance:: Appears stated age Attitude/Demeanor/Rapport: Engaged Affect (typically observed): Accepting Orientation: : Oriented to Self, Oriented to Place, Oriented to Situation, Oriented to  Time Alcohol / Substance Use: Not Applicable Psych Involvement: No (comment)  Admission diagnosis:  Atrial fibrillation with rapid ventricular response (HCC) [I48.91] Atrial fibrillation with RVR (HCC) [I48.91] Patient Active Problem List   Diagnosis Date Noted   Atrial fibrillation with rapid ventricular response (HCC) 02/01/2024   S/P percutaneous endoscopic gastrostomy (PEG) tube placement (HCC) 01/14/2024   Peripheral neuropathy 10/16/2023   Gastric ulcer 09/09/2023   Esophageal obstruction due to food impaction 09/08/2023   Genetic testing 02/13/2023   Dysphagia 01/04/2023   Need for influenza vaccination 01/04/2023   Malignant neoplasm of esophagus (HCC) 01/01/2023   Diverticulosis of colon without hemorrhage 01/01/2023   Neck pain 12/08/2022   Esophageal dysphagia 12/08/2022   BMI 32.0-32.9,adult 12/08/2022   Fatty liver 12/08/2022   Preop cardiovascular exam 12/08/2022   Chest pain 06/06/2022   GERD (gastroesophageal reflux disease) 05/29/2022   Olecranon bursitis, right elbow 05/29/2022   Need for immunization  against influenza 11/06/2021   Left maxillary sinusitis 09/05/2021   Numbness in right leg 07/04/2021   Impaired vision 07/04/2021   Injury of right knee 02/27/2021   Body mass index (BMI) 33.0-33.9, adult 02/27/2021   Obstructive sleep apnea on CPAP 01/17/2021   Atrial fibrillation (HCC) 11/20/2020   Cramps, extremity 02/28/2020   Anxiety 02/28/2020   Type 2 diabetes  mellitus with other specified complication (HCC) 10/13/2018   Hyperlipidemia 10/13/2018   Hypothyroidism 10/13/2018   Essential hypertension 10/13/2018   PCP:  Tobie Suzzane POUR, MD Pharmacy:   [facility] [place], [place] - [address] [place] [address] Phone: [phone] Fax: [phone]     Social Drivers of Health (SDOH) Social History: SDOH Screenings   Food Insecurity: No Food Insecurity (02/01/2024)  Housing: Low Risk (02/01/2024)  Transportation Needs: No Transportation Needs (02/01/2024)  Utilities: Not At Risk (02/01/2024)  Alcohol Screen: Low Risk (05/13/2023)  Depression (PHQ2-9): High Risk (01/20/2024)  Financial Resource Strain: Low Risk (01/09/2024)  Physical Activity: Insufficiently Active (01/09/2024)  Social Connections: Moderately Isolated (02/01/2024)  Stress: Stress Concern Present (01/09/2024)  Tobacco Use: Low Risk (02/01/2024)  Health Literacy: Adequate Health Literacy (05/13/2023)   SDOH Interventions:     Readmission Risk Interventions    02/02/2024    1:42 PM  Readmission Risk Prevention Plan  Transportation Screening Complete  Home Care Screening Complete  Medication Review (RN CM) Complete

## 2024-02-02 NOTE — Evaluation (Signed)
 Physical Therapy Evaluation Patient Details Name: Samuel Hubbard MRN: [id number] DOB: Jun 02, 1955 Today's Date: 02/02/2024  History of Present Illness  Samuel Hubbard is a 68 y.o. male with medical history significant of atrial fibrillation, diabetes mellitus, GERD, hypertension, hypothyroidism, OSA on CPAP, history of pheochromocytoma, hyperlipidemia, esophageal cancer who followed up with his oncologist today and in the office patient was found to be hypotensive with A-fib with RVR and therefore sent to the emergency room for further management.  According to the patient and and his wife, he has been having generalized weakness that has been ongoing since his last radiation therapy about a month ago.  He has been having increased sleepiness during the day and has also sustained  falls at home.  He denies chest pain, nausea vomiting abdominal pain or urinary complaints.   Clinical Impression  Patient functioning near baseline for functional mobility and gait and limited mostly due to IV lines, peg tube site, other wise demonstrates good return for walking in room without AD and pushing his IV pole in hallways without loss of balance. Patient encouraged to ambulate with nursing staff and mobility tech daily for length of stay. Patient tolerated sitting up in chair after therapy. Plan:  Patient discharged from physical therapy to care of nursing for ambulation daily as tolerated for length of stay.           If plan is discharge home, recommend the following: Help with stairs or ramp for entrance   Can travel by private vehicle        Equipment Recommendations None recommended by PT  Recommendations for Other Services       Functional Status Assessment Patient has had a recent decline in their functional status and/or demonstrates limited ability to make significant improvements in function in a reasonable and predictable amount of time     Precautions / Restrictions  Precautions Precautions: Fall Recall of Precautions/Restrictions: Intact Restrictions Weight Bearing Restrictions Per Provider Order: No      Mobility  Bed Mobility Overal bed mobility: Modified Independent                  Transfers Overall transfer level: Modified independent                      Ambulation/Gait Ambulation/Gait assistance: Modified independent (Device/Increase time) Gait Distance (Feet): 100 Feet Assistive device: IV Pole Gait Pattern/deviations: WFL(Within Functional Limits) Gait velocity: slightly decreased     General Gait Details: grossly WFL with good return for ambulating in room without AD and pushing IV pole in hallways without loss of balance.  Stairs            Wheelchair Mobility     Tilt Bed    Modified Rankin (Stroke Patients Only)       Balance Overall balance assessment: Mild deficits observed, not formally tested                                           Pertinent Vitals/Pain Pain Assessment Pain Assessment: No/denies pain    Home Living Family/patient expects to be discharged to:: Private residence Living Arrangements: Spouse/significant other Available Help at Discharge: Family;Available 24 hours/day Type of Home: House Home Access: Stairs to enter Entrance Stairs-Rails: Left Entrance Stairs-Number of Steps: 2 Alternate Level Stairs-Number of Steps: 10 Home Layout: Two level Home Equipment: None  Prior Function Prior Level of Function : Independent/Modified Independent             Mobility Comments: Tourist information centre manager without AD ADLs Comments: Independent     Extremity/Trunk Assessment   Upper Extremity Assessment Upper Extremity Assessment: Overall WFL for tasks assessed    Lower Extremity Assessment Lower Extremity Assessment: Overall WFL for tasks assessed    Cervical / Trunk Assessment Cervical / Trunk Assessment: Normal  Communication    Communication Communication: No apparent difficulties    Cognition Arousal: Alert Behavior During Therapy: WFL for tasks assessed/performed                             Following commands: Intact       Cueing Cueing Techniques: Verbal cues     General Comments      Exercises     Assessment/Plan    PT Assessment All further PT needs can be met in the next venue of care  PT Problem List Decreased strength;Decreased activity tolerance;Decreased balance;Decreased mobility       PT Treatment Interventions      PT Goals (Current goals can be found in the Care Plan section)  Acute Rehab PT Goals Patient Stated Goal: return home PT Goal Formulation: With patient Time For Goal Achievement: 02/02/24 Potential to Achieve Goals: Good    Frequency       Co-evaluation PT/OT/SLP Co-Evaluation/Treatment: Yes Reason for Co-Treatment: To address functional/ADL transfers PT goals addressed during session: Mobility/safety with mobility;Balance OT goals addressed during session: ADL's and self-care       AM-PAC PT 6 Clicks Mobility  Outcome Measure Help needed turning from your back to your side while in a flat bed without using bedrails?: None Help needed moving from lying on your back to sitting on the side of a flat bed without using bedrails?: None Help needed moving to and from a bed to a chair (including a wheelchair)?: None Help needed standing up from a chair using your arms (e.g., wheelchair or bedside chair)?: None Help needed to walk in hospital room?: A Little Help needed climbing 3-5 steps with a railing? : A Little 6 Click Score: 22    End of Session   Activity Tolerance: Patient tolerated treatment well Patient left: in chair;with call bell/phone within reach Nurse Communication: Mobility status PT Visit Diagnosis: Unsteadiness on feet (R26.81);Other abnormalities of gait and mobility (R26.89);Muscle weakness (generalized) (M62.81)    Time:  9058-9043 PT Time Calculation (min) (ACUTE ONLY): 15 min   Charges:   PT Evaluation $PT Eval Low Complexity: 1 Low PT Treatments $Therapeutic Activity: 8-22 mins PT General Charges $$ ACUTE PT VISIT: 1 Visit         12:29 PM, 02/02/2024 Lynwood Music, MPT Physical Therapist with [facility] [phone] office [phone] mobile phone

## 2024-02-02 NOTE — Progress Notes (Signed)
 PROGRESS NOTE    Siddharth Babington  FMW:[id number] DOB: 05/10/1955 DOA: 02/01/2024 PCP: Tobie Suzzane POUR, MD   Brief Narrative:    Samuel Hubbard is a 68 y.o. male with medical history significant of atrial fibrillation, diabetes mellitus, GERD, hypertension, hypothyroidism, OSA on CPAP, history of pheochromocytoma, hyperlipidemia, esophageal cancer who followed up with his oncologist today and in the office patient was found to be hypotensive with A-fib with RVR and therefore sent to the emergency room for further management.  According to the patient and and his wife, he has been having generalized weakness that has been ongoing since his last radiation therapy about a month ago.  He has been having increased sleepiness during the day and has also sustained  falls at home   Assessment & Plan:   Principal Problem:   Atrial fibrillation with rapid ventricular response (HCC)  Assessment and Plan:   Atrial fibrillation with rapid ventricular response-now in NSR Appreciate cardiology evaluation with conversion of IV amiodarone  to oral dosing of 200 mg twice daily for 3 weeks and then 200 mg daily Heparin  drip to Eliquis  Follow-up with cardiology outpatient and monitor on telemetry  Worsening anemia Likely related to iron  deficiency per anemia panel No overt bleeding noted and currently appears stable on repeat testing Recheck CBC in a.m. IV iron  12/16   Diabetes mellitus type II with mild hyperglycemia Placed on as needed sliding scale insulin  Monitor glucose level closely   GERD Continue PPI   Hypertension Patient is currently hypotensive likely secondary to dehydration Required IV fluid upon arrival We will hold antihypertensives at this time Monitor blood pressure closely May require midodrine initiation prior to discharge   Hypothyroidism Continue levothyroxine    OSA on CPAP Placed on CPAP at night   History of pheochromocytoma Outpatient follow-up    Hyperlipidemia Continue statin therapy   Esophageal cancer  Patient follows up with oncologist as an outpatient Has completed sessions of radiation as well as chemotherapy Currently on tube feeds  Falls at home PT/OT evaluation    DVT prophylaxis: Eliquis  Code Status: Full Family Communication: None at bedside Disposition Plan:  Status is: Inpatient Remains inpatient appropriate because: Need for IV medications and close monitoring of blood pressure.   Consultants:  Cardiology  Procedures:  None  Antimicrobials:  None   Subjective: Patient seen and evaluated today with no new acute complaints or concerns.  Heart rate has improved and patient is now in sinus rhythm.  Objective: Vitals:   02/02/24 0800 02/02/24 0900 02/02/24 1000 02/02/24 1100  BP: 101/60 104/62 122/66 116/68  Pulse: 70 61 70 (!) 120  Resp: 14 15 16 12   Temp:      TempSrc:      SpO2: 99% 97% 99% 98%  Weight:      Height:        Intake/Output Summary (Last 24 hours) at 02/02/2024 1152 Last data filed at 02/02/2024 0658 Gross per 24 hour  Intake 2549.75 ml  Output --  Net 2549.75 ml   Filed Weights   02/01/24 1427 02/01/24 1858  Weight: 90.7 kg 91 kg    Examination:  General exam: Appears calm and comfortable  Respiratory system: Clear to auscultation. Respiratory effort normal. Cardiovascular system: S1 & S2 heard, RRR.  Gastrointestinal system: Abdomen is soft, tube feed present C/D/I Central nervous system: Alert and awake Extremities: No edema Skin: No significant lesions noted, right sided chest wall port C/D/I Psychiatry: Flat affect.    Data Reviewed: I have personally reviewed  following labs and imaging studies  CBC: Recent Labs  Lab 02/01/24 1316 02/02/24 0317 02/02/24 0814  WBC 3.7* 3.0*  --   NEUTROABS 2.6  --   --   HGB 10.0* 7.6* 8.3*  HCT 32.7* 25.5* 27.3*  MCV 94.2 95.5  --   PLT 246 171  --    Basic Metabolic Panel: Recent Labs  Lab 02/01/24 1316  02/02/24 0317  NA 141 140  K 4.4 3.9  CL 101 103  CO2 27 31  GLUCOSE 165* 202*  BUN 32* 23  CREATININE 0.59* 0.47*  CALCIUM  9.4 8.1*  MG 1.6*  --    GFR: Estimated Creatinine Clearance: 98.3 mL/min (A) (by C-G formula based on SCr of 0.47 mg/dL (L)). Liver Function Tests: Recent Labs  Lab 02/01/24 1316  AST 35  ALT 46*  ALKPHOS 86  BILITOT 0.3  PROT 6.8  ALBUMIN 3.7   No results for input(s): LIPASE, AMYLASE in the last 168 hours. No results for input(s): AMMONIA in the last 168 hours. Coagulation Profile: Recent Labs  Lab 02/01/24 1802  INR 1.1   Cardiac Enzymes: No results for input(s): CKTOTAL, CKMB, CKMBINDEX, TROPONINI in the last 168 hours. BNP (last 3 results) No results for input(s): PROBNP in the last 8760 hours. HbA1C: No results for input(s): HGBA1C in the last 72 hours. CBG: Recent Labs  Lab 02/01/24 2138 02/02/24 0729 02/02/24 1132  GLUCAP 122* 125* 146*   Lipid Profile: No results for input(s): CHOL, HDL, LDLCALC, TRIG, CHOLHDL, LDLDIRECT in the last 72 hours. Thyroid  Function Tests: No results for input(s): TSH, T4TOTAL, FREET4, T3FREE, THYROIDAB in the last 72 hours. Anemia Panel: Recent Labs    02/01/24 1316 02/02/24 0317 02/02/24 0814  VITAMINB12  --  508  --   FOLATE >20.0  --   --   FERRITIN  --  624*  --   TIBC  --  281  --   IRON   --  40*  --   RETICCTPCT  --   --  1.7   Sepsis Labs: Recent Labs  Lab 02/01/24 1802  PROCALCITON <0.10  LATICACIDVEN 1.3    Recent Results (from the past 240 hours)  MRSA Next Gen by PCR, Nasal     Status: None   Collection Time: 02/01/24  6:19 PM   Specimen: Nasal Mucosa; Nasal Swab  Result Value Ref Range Status   MRSA by PCR Next Gen NOT DETECTED NOT DETECTED Final    Comment: (NOTE) The GeneXpert MRSA Assay (FDA approved for NASAL specimens only), is one component of a comprehensive MRSA colonization surveillance program. It is not intended to  diagnose MRSA infection nor to guide or monitor treatment for MRSA infections. Test performance is not FDA approved in patients less than 15 years old. Performed at [facility], [address] [place], [place] [address]          Radiology Studies: CT Cervical Spine Wo Contrast Result Date: 02/01/2024 EXAM: CT CERVICAL SPINE WITHOUT CONTRAST 02/01/2024 03:27:16 PM TECHNIQUE: CT of the cervical spine was performed without the administration of intravenous contrast. Multiplanar reformatted images are provided for review. Automated exposure control, iterative reconstruction, and/or weight based adjustment of the mA/kV was utilized to reduce the radiation dose to as low as reasonably achievable. COMPARISON: None available. CLINICAL HISTORY: Neck trauma (Age >= 65y) FINDINGS: BONES AND ALIGNMENT: Alignment is maintained. Partially visualized right adjacent central venous catheter. No acute fracture or traumatic malalignment. DEGENERATIVE CHANGES: Mild degenerative endplate osteophytes. Arthrosis  at multiple levels most pronounced at C6-C7. Facet arthrosis at multiple levels. Disc bulge at C3-C4 results in mild spinal canal stenosis. Disc bulge at C5-C6 results in mild spinal canal stenosis. SOFT TISSUES: No prevertebral soft tissue swelling. IMPRESSION: 1. No evidence of acute traumatic injury. 2. Degenerative changes as above. Electronically signed by: Donnice Mania MD 02/01/2024 03:44 PM EST RP Workstation: [id number]   CT Head Wo Contrast Result Date: 02/01/2024 EXAM: CT HEAD WITHOUT 02/01/2024 03:27:16 PM TECHNIQUE: CT of the head was performed without the administration of intravenous contrast. Automated exposure control, iterative reconstruction, and/or weight based adjustment of the mA/kV was utilized to reduce the radiation dose to as low as reasonably achievable. COMPARISON: None available. CLINICAL HISTORY: Head trauma, intracranial arterial injury suspected. FINDINGS: BRAIN AND VENTRICLES: No  acute intracranial hemorrhage. No mass effect or midline shift. No extra-axial fluid collection. No evidence of acute infarct. No hydrocephalus. Scattered periventricular white matter hypoattenuation, consistent with mild chronic ischemic microvascular disease. Remote lacunar infarct in the left subinsular region. Calcified atherosclerotic plaque in cavernous/supraclinoid internal carotid arteries. ORBITS: Right lens replacement noted. SINUSES AND MASTOIDS: Polypoid mucosal thickening in right maxillary sinus. SOFT TISSUES AND SKULL: No acute skull fracture. No acute soft tissue abnormality. IMPRESSION: 1. No acute intracranial abnormality. 2. Remote lacunar infarct in the left subinsular region. 3. Mild chronic ischemic microvascular disease. Electronically signed by: Donnice Mania MD 02/01/2024 03:36 PM EST RP Workstation: [id number]   DG Chest Port 1 View Result Date: 02/01/2024 CLINICAL DATA:  Shortness of breath. EXAM: PORTABLE CHEST 1 VIEW COMPARISON:  Chest radiograph dated 09/08/2023 FINDINGS: Right-sided Port-A-Cath with tip over central SVC. No focal consolidation, pleural effusion, pneumothorax. The cardiac silhouette is within normal limits. No acute osseous pathology. IMPRESSION: No active disease. Electronically Signed   By: Vanetta Chou M.D.   On: 02/01/2024 15:13   DG Shoulder Right Result Date: 02/01/2024 EXAM: 1 VIEW(S) XRAY OF THE RIGHT SHOULDER 02/01/2024 03:02:00 PM COMPARISON: None available. CLINICAL HISTORY: sob sob FINDINGS: BONES AND JOINTS: Degenerative changes in the right shoulder involving glenohumeral and acromioclavicular joints with moderate subacromial spur formation. No evidence of acute fracture or dislocation. No focal bone lesion or bone destruction. SOFT TISSUES: Power port type central venous catheter with tip projecting over the low SVC region. No abnormal calcifications. Visualized lung is unremarkable. IMPRESSION: 1. No acute fracture or dislocation. 2.  Degenerative changes in the right shoulder involving the glenohumeral and acromioclavicular joints with moderate subacromial spur formation. Electronically signed by: Elsie Gravely MD 02/01/2024 03:12 PM EST RP Workstation: [id number]        Scheduled Meds:  amiodarone   200 mg Oral BID   apixaban   5 mg Oral BID   atorvastatin   40 mg Oral Daily   Chlorhexidine  Gluconate Cloth  6 each Topical Q0600   citalopram   20 mg Oral Daily   feeding supplement (GLUCERNA 1.2 CAL)  900 mL Per Tube Q24H   insulin  aspart  0-5 Units Subcutaneous QHS   insulin  aspart  0-9 Units Subcutaneous TID WC   levothyroxine   200 mcg Oral Q0600   LORazepam   0.5 mg Oral BID   mirtazapine   15 mg Oral QHS   pantoprazole   40 mg Oral Daily   Continuous Infusions:  sodium chloride  75 mL/hr at 02/02/24 1142   iron  sucrose       LOS: 0 days    Time spent: 55 minutes    Primrose Oler JONETTA Fairly, DO [facility]  If 7PM-7AM, please contact night-coverage [email or web address]  02/02/2024, 11:52 AM

## 2024-02-02 NOTE — Progress Notes (Signed)
*  PRELIMINARY RESULTS* Echocardiogram 2D Echocardiogram has been performed.  Teresa Aida PARAS 02/02/2024, 9:57 AM
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
## 2024-02-02 NOTE — Progress Notes (Signed)
 Telemetry reviewed, converted from afib to SR on amio gtt yesterday evening just before 7pm. Can stop amio gtt, start oral amio 200mg  bid x 3 weeks then 200mg  daily. Continue amio for time being, can consider as outpatient at some point transitioning back to av nodal agents alone however with chronic poor oral hydration and low bp's related to his esophageal cancer unclear if would be able to get to doses to control his rhythms. Can stop heparin , discussed with primary team regarding Hgb decline, no overt signs of bleeding, additional labs being sent. For now will continue eliquis .   Would remain off home bp meds, remain off home lopressor . We will sign off inpatient care and arrange outpatient f/u.    JINNY Ross MD

## 2024-02-03 DIAGNOSIS — E860 Dehydration: Secondary | ICD-10-CM | POA: Diagnosis not present

## 2024-02-03 DIAGNOSIS — E440 Moderate protein-calorie malnutrition: Secondary | ICD-10-CM | POA: Insufficient documentation

## 2024-02-03 DIAGNOSIS — I4891 Unspecified atrial fibrillation: Secondary | ICD-10-CM | POA: Diagnosis not present

## 2024-02-03 LAB — CBC
HCT: 28.8 % — ABNORMAL LOW (ref 39.0–52.0)
Hemoglobin: 8.6 g/dL — ABNORMAL LOW (ref 13.0–17.0)
MCH: 28.7 pg (ref 26.0–34.0)
MCHC: 29.9 g/dL — ABNORMAL LOW (ref 30.0–36.0)
MCV: 96 fL (ref 80.0–100.0)
Platelets: 195 K/uL (ref 150–400)
RBC: 3 MIL/uL — ABNORMAL LOW (ref 4.22–5.81)
RDW: 14.8 % (ref 11.5–15.5)
WBC: 3.2 K/uL — ABNORMAL LOW (ref 4.0–10.5)
nRBC: 0 % (ref 0.0–0.2)

## 2024-02-03 LAB — BASIC METABOLIC PANEL WITH GFR
Anion gap: 10 (ref 5–15)
BUN: 19 mg/dL (ref 8–23)
CO2: 26 mmol/L (ref 22–32)
Calcium: 8.7 mg/dL — ABNORMAL LOW (ref 8.9–10.3)
Chloride: 107 mmol/L (ref 98–111)
Creatinine, Ser: 0.44 mg/dL — ABNORMAL LOW (ref 0.61–1.24)
GFR, Estimated: >60 mL/min (ref >60)
Glucose, Bld: 137 mg/dL — ABNORMAL HIGH (ref 70–99)
Potassium: 4.3 mmol/L (ref 3.5–5.1)
Sodium: 143 mmol/L (ref 135–145)

## 2024-02-03 LAB — GLUCOSE, CAPILLARY
Glucose-Capillary: 130 mg/dL — ABNORMAL HIGH (ref 70–99)
Glucose-Capillary: 138 mg/dL — ABNORMAL HIGH (ref 70–99)

## 2024-02-03 LAB — MAGNESIUM: Magnesium: 1.7 mg/dL (ref 1.7–2.4)

## 2024-02-03 MED ORDER — FREE WATER: 180.0000 mL | Freq: Every day | Status: AC

## 2024-02-03 MED ORDER — MAGNESIUM SULFATE 2 GM/50ML IV SOLN
2.0000 g | Freq: Once | INTRAVENOUS | Status: AC
  Administered 2024-02-03: 10:00:00 2 g via INTRAVENOUS
  Filled 2024-02-03: qty 50

## 2024-02-03 MED ORDER — ADULT MULTIVITAMIN W/MINERALS CH: 1.0000 | ORAL_TABLET | Freq: Every day | ORAL | Status: AC

## 2024-02-03 MED ORDER — AMIODARONE HCL 200 MG PO TABS: ORAL_TABLET | ORAL | 1 refills | Status: AC

## 2024-02-03 MED ORDER — HEPARIN SOD (PORK) LOCK FLUSH 100 UNIT/ML IV SOLN
500.0000 [IU] | Freq: Once | INTRAVENOUS | Status: AC
  Administered 2024-02-03: 12:00:00 500 [IU] via INTRAVENOUS
  Filled 2024-02-03: qty 5

## 2024-02-03 NOTE — Hospital Course (Signed)
 68 y.o. male with medical history significant of atrial fibrillation, diabetes mellitus, GERD, hypertension, hypothyroidism, OSA on CPAP, history of pheochromocytoma, hyperlipidemia, esophageal cancer who followed up with his oncologist today and in the office patient was found to be hypotensive with A-fib with RVR and therefore sent to the emergency room for further management.  According to the patient and and his wife, he has been having generalized weakness that has been ongoing since his last radiation therapy about a month ago.  He has been having increased sleepiness during the day and has also sustained  falls at home

## 2024-02-03 NOTE — Care Management Important Message (Signed)
 Important Message  Patient Details  Name: Samuel Hubbard MRN: [id number] Date of Birth: 1955/10/05   Important Message Given:  N/A - LOS <3 / Initial given by admissions     [name] Ada 02/03/2024, 11:39 AM

## 2024-02-03 NOTE — Plan of Care (Signed)

## 2024-02-03 NOTE — Discharge Summary (Signed)
 Physician Discharge Summary  Wilman Tucker FMW:[id number] DOB: 11/05/1955 DOA: 02/01/2024  PCP: Tobie Suzzane POUR, MD Oncology: Davonna   Admit date: 02/01/2024 Discharge date: 02/03/2024  Admitted From:  Home  Disposition: Home with HH   Recommendations for Outpatient Follow-up:  Follow up with PCP in 1 weeks Follow up with oncology in 1-2 weeks Follow up with cardiology as scheduled   Home Health:  PT   Discharge Condition: STABLE   CODE STATUS: FULL DIET:  per dietitian:     Home Tube Feeding Tube feeding formula:  Nutren 1.5  Intermittent feeding:  95 mL per hour from 6 pm to 10 am    Total amount of formula per day: 1500 mL per day Number of cartons per day: 6 Amount of water  for tube flushing : 180 mL 5 times per day Amount of extra water  to give daily: 900 mL   Brief Hospitalization Summary: Please see all hospital notes, images, labs for full details of the hospitalization. Admission provider HPI:  68 y.o. male with medical history significant of atrial fibrillation, diabetes mellitus, GERD, hypertension, hypothyroidism, OSA on CPAP, history of pheochromocytoma, hyperlipidemia, esophageal cancer who followed up with his oncologist today and in the office patient was found to be hypotensive with A-fib with RVR and therefore sent to the emergency room for further management.  According to the patient and and his wife, he has been having generalized weakness that has been ongoing since his last radiation therapy about a month ago.  He has been having increased sleepiness during the day and has also sustained  falls at home   Hospital Course by listed problems addressed  Atrial fibrillation with rapid ventricular response-now in NSR Appreciate cardiology evaluation with conversion of IV amiodarone  to oral dosing of 200 mg twice daily for 3 weeks and then 200 mg daily Apixaban  for full anticoagulation  Follow-up with cardiology outpatient   Worsening anemia Likely related  to iron  deficiency per anemia panel No overt bleeding noted and currently appears stable on repeat testing Hg stable at 8.6  Pt was given IV iron  12/16   Diabetes mellitus type II with mild hyperglycemia Placed on as needed sliding scale insulin  Monitor glucose level closely  CBG (last 3)  Recent Labs    02/02/24 1619 02/02/24 2142 02/03/24 0730  GLUCAP 108* 135* 130*   GERD Continue PPI therapy    Hypertension BPs have stabilized after repleting diminished fluids    Hypothyroidism Continue levothyroxine    OSA on CPAP Placed on CPAP at night   History of pheochromocytoma Outpatient follow-up   Hyperlipidemia Continue statin therapy   Esophageal cancer  Patient follows up with oncologist as an outpatient Has completed sessions of radiation as well as chemotherapy Currently on tube feeds per RD recommendations Home Tube Feeding per RD Tube feeding formula:  Nutren 1.5  Intermittent feeding:  95 mL per hour from 6 pm to 10 am    Total amount of formula per day: 1500 mL per day Number of cartons per day: 6 Amount of water  for tube flushing : 180 mL 5 times per day Amount of extra water  to give daily: 900 mL    Falls at home PT/OT evaluation---Home Health PT ordered     Discharge Diagnoses:  Principal Problem:   Atrial fibrillation with rapid ventricular response (HCC) Active Problems:   Malnutrition of moderate degree   Discharge Instructions:  Allergies as of 02/03/2024       Reactions   Other Swelling  Nuts, swelling of lips and tongue. Also allergic to legumes   Wasp Venom Anaphylaxis   Zolpidem Other (See Comments)   Unknown         Medication List     STOP taking these medications    blood glucose meter kit and supplies   dexamethasone  4 MG tablet Commonly known as: DECADRON    glucose blood test strip   Lancets 30G Misc   magnesium  oxide 400 (240 Mg) MG tablet Commonly known as: MAG-OX   metoprolol  tartrate 25 MG  tablet Commonly known as: LOPRESSOR    olmesartan  40 MG tablet Commonly known as: BENICAR    prochlorperazine  10 MG tablet Commonly known as: COMPAZINE        TAKE these medications    amiodarone  200 MG tablet Commonly known as: PACERONE  1 po BID thru 02/23/24 then 1 po daily   atorvastatin  40 MG tablet Commonly known as: LIPITOR Take 1 tablet (40 mg total) by mouth daily.   citalopram  20 MG tablet Commonly known as: CELEXA  Take 1 tablet (20 mg total) by mouth daily.   Eliquis  5 MG Tabs tablet Generic drug: apixaban  Take 5 mg by mouth 2 (two) times daily.   free water  Soln Place 180 mLs into feeding tube 5 (five) times daily.   levothyroxine  200 MCG tablet Commonly known as: SYNTHROID  Take 1 tablet (200 mcg total) by mouth daily.   lidocaine -prilocaine  cream Commonly known as: EMLA  Apply to affected area once   LORazepam  0.5 MG tablet Commonly known as: ATIVAN  Take 1 tablet (0.5 mg total) by mouth 2 (two) times daily.   metFORMIN  500 MG tablet Commonly known as: GLUCOPHAGE  Take 2 tablets (1,000 mg total) by mouth 2 (two) times daily with a meal.   mirtazapine  15 MG tablet Commonly known as: Remeron  Take 1 tablet (15 mg total) by mouth at bedtime.   multivitamin with minerals Tabs tablet Place 1 tablet into feeding tube daily. Start taking on: February 04, 2024   nitroGLYCERIN  0.4 MG SL tablet Commonly known as: NITROSTAT  Place 1 tablet (0.4 mg total) under the tongue every 5 (five) minutes as needed for chest pain.   omeprazole  20 MG capsule Commonly known as: PRILOSEC Take 20 mg by mouth daily. *dissolves in mouth What changed: Another medication with the same name was removed. Continue taking this medication, and follow the directions you see here.   ondansetron  4 MG disintegrating tablet Commonly known as: ZOFRAN -ODT Take 4 mg by mouth every 8 (eight) hours as needed for nausea or vomiting.   oxyCODONE  5 MG/5ML solution Commonly known as:  ROXICODONE  Take 5 mLs (5 mg total) by mouth every 4 (four) hours as needed for severe pain (pain score 7-10).   sucralfate  1 g tablet Commonly known as: CARAFATE  Take 1 g by mouth 2 (two) times daily.   Tylenol  Childrens 160 MG/5ML suspension Generic drug: acetaminophen  Take 960 mg by mouth every 6 (six) hours as needed for mild pain (pain score 1-3) or moderate pain (pain score 4-6).        Follow-up Information     Tobie Suzzane POUR, MD Follow up in 1 week(s).   Specialty: Internal Medicine Why: Hospital Follow Up Contact information: [address] [place] [address] [phone]         [name], MD. Schedule an appointment as soon as possible for a visit in 1 week(s).   Specialty: Oncology Why: Hospital Follow Up Contact information: [address] [place] [address] [phone]  [facility] Follow up.   Why: Agency will call to arrange home visit. Contact information: [address] [place] [address] [phone]                Allergies[1] Allergies as of 02/03/2024       Reactions   Other Swelling   Nuts, swelling of lips and tongue. Also allergic to legumes   Wasp Venom Anaphylaxis   Zolpidem Other (See Comments)   Unknown         Medication List     STOP taking these medications    blood glucose meter kit and supplies   dexamethasone  4 MG tablet Commonly known as: DECADRON    glucose blood test strip   Lancets 30G Misc   magnesium  oxide 400 (240 Mg) MG tablet Commonly known as: MAG-OX   metoprolol  tartrate 25 MG tablet Commonly known as: LOPRESSOR    olmesartan  40 MG tablet Commonly known as: BENICAR    prochlorperazine  10 MG tablet Commonly known as: COMPAZINE        TAKE these medications    amiodarone  200 MG tablet Commonly known as: PACERONE  1 po BID thru 02/23/24 then 1 po daily   atorvastatin  40 MG tablet Commonly known as: LIPITOR Take 1 tablet (40 mg  total) by mouth daily.   citalopram  20 MG tablet Commonly known as: CELEXA  Take 1 tablet (20 mg total) by mouth daily.   Eliquis  5 MG Tabs tablet Generic drug: apixaban  Take 5 mg by mouth 2 (two) times daily.   free water  Soln Place 180 mLs into feeding tube 5 (five) times daily.   levothyroxine  200 MCG tablet Commonly known as: SYNTHROID  Take 1 tablet (200 mcg total) by mouth daily.   lidocaine -prilocaine  cream Commonly known as: EMLA  Apply to affected area once   LORazepam  0.5 MG tablet Commonly known as: ATIVAN  Take 1 tablet (0.5 mg total) by mouth 2 (two) times daily.   metFORMIN  500 MG tablet Commonly known as: GLUCOPHAGE  Take 2 tablets (1,000 mg total) by mouth 2 (two) times daily with a meal.   mirtazapine  15 MG tablet Commonly known as: Remeron  Take 1 tablet (15 mg total) by mouth at bedtime.   multivitamin with minerals Tabs tablet Place 1 tablet into feeding tube daily. Start taking on: February 04, 2024   nitroGLYCERIN  0.4 MG SL tablet Commonly known as: NITROSTAT  Place 1 tablet (0.4 mg total) under the tongue every 5 (five) minutes as needed for chest pain.   omeprazole  20 MG capsule Commonly known as: PRILOSEC Take 20 mg by mouth daily. *dissolves in mouth What changed: Another medication with the same name was removed. Continue taking this medication, and follow the directions you see here.   ondansetron  4 MG disintegrating tablet Commonly known as: ZOFRAN -ODT Take 4 mg by mouth every 8 (eight) hours as needed for nausea or vomiting.   oxyCODONE  5 MG/5ML solution Commonly known as: ROXICODONE  Take 5 mLs (5 mg total) by mouth every 4 (four) hours as needed for severe pain (pain score 7-10).   sucralfate  1 g tablet Commonly known as: CARAFATE  Take 1 g by mouth 2 (two) times daily.   Tylenol  Childrens 160 MG/5ML suspension Generic drug: acetaminophen  Take 960 mg by mouth every 6 (six) hours as needed for mild pain (pain score 1-3) or moderate  pain (pain score 4-6).        Procedures/Studies: ECHOCARDIOGRAM COMPLETE Result Date: 02/02/2024    ECHOCARDIOGRAM REPORT   Patient Name:  STEFFAN KIN Date of Exam: 02/02/2024 Medical Rec #:  [id number]      Height:       72.0 in Accession #:    [id number]     Weight:       200.6 lb Date of Birth:  Jul 25, 1955      BSA:          2.133 m Patient Age:    68 years       BP:           81/40 mmHg Patient Gender: M              HR:           68 bpm. Exam Location:  [facility] Procedure: 2D Echo, Cardiac Doppler and Color Doppler (Both Spectral and Color            Flow Doppler were utilized during procedure). Indications:    Atrial Fibrillation I48.91  History:        Patient has no prior history of Echocardiogram examinations.                 Arrythmias:Atrial Fibrillation; Risk Factors:Hypertension,                 Diabetes and Dyslipidemia. OSA on CPAP.  Sonographer:    Aida Pizza RCS Referring Phys: [id number] PRINCE T DJAN IMPRESSIONS  1. Left ventricular ejection fraction, by estimation, is 60 to 65%. The left ventricle has normal function. The left ventricle has no regional wall motion abnormalities. There is moderate left ventricular hypertrophy. Left ventricular diastolic parameters were normal.  2. Right ventricular systolic function is normal. The right ventricular size is normal. Tricuspid regurgitation signal is inadequate for assessing PA pressure.  3. The mitral valve is normal in structure. Trivial mitral valve regurgitation. No evidence of mitral stenosis.  4. The aortic valve is tricuspid. There is mild calcification of the aortic valve. There is mild thickening of the aortic valve. Aortic valve regurgitation is mild. No aortic stenosis is present.  5. Aortic dilatation noted. There is mild dilatation of the aortic root, measuring 40 mm.  6. The inferior vena cava is dilated in size with >50% respiratory variability, suggesting right atrial pressure of 8 mmHg. FINDINGS  Left Ventricle: Left  ventricular ejection fraction, by estimation, is 60 to 65%. The left ventricle has normal function. The left ventricle has no regional wall motion abnormalities. The left ventricular internal cavity size was normal in size. There is  moderate left ventricular hypertrophy. Left ventricular diastolic parameters were normal. Right Ventricle: The right ventricular size is normal. Right vetricular wall thickness was not well visualized. Right ventricular systolic function is normal. Tricuspid regurgitation signal is inadequate for assessing PA pressure. Left Atrium: Left atrial size was normal in size. Right Atrium: Right atrial size was normal in size. Pericardium: There is no evidence of pericardial effusion. Mitral Valve: The mitral valve is normal in structure. Trivial mitral valve regurgitation. No evidence of mitral valve stenosis. Tricuspid Valve: The tricuspid valve is normal in structure. Tricuspid valve regurgitation is trivial. No evidence of tricuspid stenosis. Aortic Valve: The aortic valve is tricuspid. There is mild calcification of the aortic valve. There is mild thickening of the aortic valve. There is mild aortic valve annular calcification. Aortic valve regurgitation is mild. No aortic stenosis is present. Aortic valve mean gradient measures 5.0 mmHg. Aortic valve peak gradient measures 9.7 mmHg. Aortic valve area, by VTI measures 2.71 cm. Pulmonic Valve: The pulmonic  valve was not well visualized. Pulmonic valve regurgitation is trivial. No evidence of pulmonic stenosis. Aorta: Aortic dilatation noted and the ascending aorta was not well visualized. There is mild dilatation of the aortic root, measuring 40 mm. Venous: The inferior vena cava is dilated in size with greater than 50% respiratory variability, suggesting right atrial pressure of 8 mmHg. IAS/Shunts: No atrial level shunt detected by color flow Doppler.  LEFT VENTRICLE PLAX 2D LVIDd:         4.60 cm   Diastology LVIDs:         3.20 cm   LV  e' medial:    7.46 cm/s LV PW:         1.40 cm   LV E/e' medial:  12.8 LV IVS:        1.30 cm   LV e' lateral:   10.20 cm/s LVOT diam:     2.00 cm   LV E/e' lateral: 9.3 LV SV:         93 LV SV Index:   43 LVOT Area:     3.14 cm  RIGHT VENTRICLE RV S prime:     12.30 cm/s TAPSE (M-mode): 2.6 cm LEFT ATRIUM           Index        RIGHT ATRIUM           Index LA diam:      3.20 cm 1.50 cm/m   RA Area:     14.90 cm LA Vol (A2C): 52.3 ml 24.52 ml/m  RA Volume:   37.10 ml  17.39 ml/m LA Vol (A4C): 73.5 ml 34.45 ml/m  AORTIC VALVE AV Area (Vmax):    2.52 cm AV Area (Vmean):   2.75 cm AV Area (VTI):     2.71 cm AV Vmax:           156.05 cm/s AV Vmean:          103.450 cm/s AV VTI:            0.342 m AV Peak Grad:      9.7 mmHg AV Mean Grad:      5.0 mmHg LVOT Vmax:         125.00 cm/s LVOT Vmean:        90.500 cm/s LVOT VTI:          0.295 m LVOT/AV VTI ratio: 0.86  AORTA Ao Root diam: 4.00 cm MITRAL VALVE MV Area (PHT): 3.08 cm    SHUNTS MV Decel Time: 246 msec    Systemic VTI:  0.30 m MV E velocity: 95.30 cm/s  Systemic Diam: 2.00 cm MV A velocity: 69.70 cm/s MV E/A ratio:  1.37 Dorn Ross MD Electronically signed by Dorn Ross MD Signature Date/Time: 02/02/2024/12:39:18 PM    Final    CT Cervical Spine Wo Contrast Result Date: 02/01/2024 EXAM: CT CERVICAL SPINE WITHOUT CONTRAST 02/01/2024 03:27:16 PM TECHNIQUE: CT of the cervical spine was performed without the administration of intravenous contrast. Multiplanar reformatted images are provided for review. Automated exposure control, iterative reconstruction, and/or weight based adjustment of the mA/kV was utilized to reduce the radiation dose to as low as reasonably achievable. COMPARISON: None available. CLINICAL HISTORY: Neck trauma (Age >= 65y) FINDINGS: BONES AND ALIGNMENT: Alignment is maintained. Partially visualized right adjacent central venous catheter. No acute fracture or traumatic malalignment. DEGENERATIVE CHANGES: Mild degenerative  endplate osteophytes. Arthrosis at multiple levels most pronounced at C6-C7. Facet arthrosis at multiple levels. Disc bulge at C3-C4  results in mild spinal canal stenosis. Disc bulge at C5-C6 results in mild spinal canal stenosis. SOFT TISSUES: No prevertebral soft tissue swelling. IMPRESSION: 1. No evidence of acute traumatic injury. 2. Degenerative changes as above. Electronically signed by: Donnice Mania MD 02/01/2024 03:44 PM EST RP Workstation: [id number]   CT Head Wo Contrast Result Date: 02/01/2024 EXAM: CT HEAD WITHOUT 02/01/2024 03:27:16 PM TECHNIQUE: CT of the head was performed without the administration of intravenous contrast. Automated exposure control, iterative reconstruction, and/or weight based adjustment of the mA/kV was utilized to reduce the radiation dose to as low as reasonably achievable. COMPARISON: None available. CLINICAL HISTORY: Head trauma, intracranial arterial injury suspected. FINDINGS: BRAIN AND VENTRICLES: No acute intracranial hemorrhage. No mass effect or midline shift. No extra-axial fluid collection. No evidence of acute infarct. No hydrocephalus. Scattered periventricular white matter hypoattenuation, consistent with mild chronic ischemic microvascular disease. Remote lacunar infarct in the left subinsular region. Calcified atherosclerotic plaque in cavernous/supraclinoid internal carotid arteries. ORBITS: Right lens replacement noted. SINUSES AND MASTOIDS: Polypoid mucosal thickening in right maxillary sinus. SOFT TISSUES AND SKULL: No acute skull fracture. No acute soft tissue abnormality. IMPRESSION: 1. No acute intracranial abnormality. 2. Remote lacunar infarct in the left subinsular region. 3. Mild chronic ischemic microvascular disease. Electronically signed by: Donnice Mania MD 02/01/2024 03:36 PM EST RP Workstation: [id number]   DG Chest Port 1 View Result Date: 02/01/2024 CLINICAL DATA:  Shortness of breath. EXAM: PORTABLE CHEST 1 VIEW COMPARISON:  Chest  radiograph dated 09/08/2023 FINDINGS: Right-sided Port-A-Cath with tip over central SVC. No focal consolidation, pleural effusion, pneumothorax. The cardiac silhouette is within normal limits. No acute osseous pathology. IMPRESSION: No active disease. Electronically Signed   By: Vanetta Chou M.D.   On: 02/01/2024 15:13   DG Shoulder Right Result Date: 02/01/2024 EXAM: 1 VIEW(S) XRAY OF THE RIGHT SHOULDER 02/01/2024 03:02:00 PM COMPARISON: None available. CLINICAL HISTORY: sob sob FINDINGS: BONES AND JOINTS: Degenerative changes in the right shoulder involving glenohumeral and acromioclavicular joints with moderate subacromial spur formation. No evidence of acute fracture or dislocation. No focal bone lesion or bone destruction. SOFT TISSUES: Power port type central venous catheter with tip projecting over the low SVC region. No abnormal calcifications. Visualized lung is unremarkable. IMPRESSION: 1. No acute fracture or dislocation. 2. Degenerative changes in the right shoulder involving the glenohumeral and acromioclavicular joints with moderate subacromial spur formation. 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 Restaging. * Tracking Code: BO * EXAM: CT CHEST, ABDOMEN, AND PELVIS WITH CONTRAST TECHNIQUE: Multidetector CT imaging of the chest, abdomen and pelvis was performed following the standard protocol during bolus administration of intravenous contrast. RADIATION DOSE REDUCTION: This exam was performed according to the departmental dose-optimization program which includes automated exposure control, adjustment of the mA and/or kV according to patient size and/or use of iterative reconstruction technique. CONTRAST:  OMNIPAQUE  IOHEXOL  300 MG/ML  SOLN COMPARISON:  PET-CT 04/13/2023. FINDINGS: CT CHEST FINDINGS  Cardiovascular: The heart size is normal. No substantial pericardial effusion. Coronary artery calcification is evident. Mild atherosclerotic calcification is noted in the wall of the thoracic aorta. Right Port-A-Cath tip is positioned in the distal SVC near the junction with the RA. Mediastinum/Nodes: 8 mm short axis paraesophageal node on image 16/2 is minimally progressive in the interval with adjacent asymmetric focal wall thickening in the proximal anterior esophagus. Circumferential  wall thickening in the distal esophagus is similar to prior PET-CT. No hilar lymphadenopathy. No axillary lymphadenopathy. Lungs/Pleura: Centrilobular emphsyema noted. Innumerable scattered tiny ground-glass nodules are seen in both lungs with r scattered solid pulmonary nodules evident including 4 mm right upper lobe nodule on 70/5 and 5 mm right lower lobe nodule on 100/5. no pleural effusion. Musculoskeletal: No worrisome lytic or sclerotic osseous abnormality. CT ABDOMEN PELVIS FINDINGS Hepatobiliary: No suspicious focal abnormality within the liver parenchyma. There is no evidence for gallstones, gallbladder wall thickening, or pericholecystic fluid. No intrahepatic or extrahepatic biliary dilation. Pancreas: No focal mass lesion. No dilatation of the main duct. No intraparenchymal cyst. No peripancreatic edema. Spleen: No splenomegaly. No suspicious focal mass lesion. Adrenals/Urinary Tract: No adrenal nodule or mass. Right kidney unremarkable. 3.8 cm exophytic cyst interpolar left kidney slightly increased from 3.3 cm (remeasured) on the previous study. Tiny well-defined homogeneous low-density lesions in both kidneys are too small to characterize but are statistically most likely benign and probably cysts. No followup imaging is recommended. No evidence for hydroureter. The urinary bladder appears normal for the degree of distention. Stomach/Bowel: Wall thickening noted at the gastroesophageal junction. Duodenum is normally  positioned as is the ligament of Treitz. J tube is new in the interval. No small bowel wall thickening. No small bowel dilatation. The terminal ileum is normal. The appendix is not well visualized, but there is no edema or inflammation in the region of the cecal tip to suggest appendicitis. No gross colonic mass. No colonic wall thickening. Diverticular changes are noted in the left colon without evidence of diverticulitis. Vascular/Lymphatic: There is mild atherosclerotic calcification of the abdominal aorta without aneurysm. There is no gastrohepatic or hepatoduodenal ligament lymphadenopathy. No retroperitoneal or mesenteric lymphadenopathy. No pelvic sidewall lymphadenopathy. Reproductive: Prostate gland is enlarged. Other: Trace free fluid in the pelvis. Musculoskeletal: No worrisome lytic or sclerotic osseous abnormality. IMPRESSION: 1. Circumferential wall thickening in the distal esophagus is similar to prior PET-CT. 2. 8 mm short axis paraesophageal node is minimally progressive in the interval with adjacent eccentric focal wall thickening in the proximal anterior esophagus. Indeterminate on the current study but warrants close attention on follow-up. 3. Innumerable scattered tiny ground-glass nodules in both lungs with scattered solid pulmonary nodules measuring up to 5 mm. These are nonspecific and likely infectious/inflammatory. Atypical etiology should be considered. Follow-up recommended to ensure resolution. 4. No evidence for metastatic disease in the abdomen or pelvis. 5.  Aortic Atherosclerosis (ICD10-I70.0). Electronically Signed   By: Camellia Candle M.D.   On: 01/15/2024 07:29     Subjective: Pt reports that he is feeling well and he is eager to go home today.   Discharge Exam: Vitals:   02/03/24 0600 02/03/24 0750  BP: (!) 112/48   Pulse: 60   Resp: 19   Temp:  98 F (36.7 C)  SpO2: 97%    Vitals:   02/03/24 0400 02/03/24 0500 02/03/24 0600 02/03/24 0750  BP: 136/76 (!) 112/41  (!) 112/48   Pulse: 71 (!) 55 60   Resp: 17 20 19    Temp: 97.6 F (36.4 C)   98 F (36.7 C)  TempSrc: Axillary   Oral  SpO2: 100% 94% 97%   Weight:      Height:       General: Pt is alert, awake, not in acute distress Cardiovascular: normal S1/S2 +, no rubs, no gallops Respiratory: CTA bilaterally, no wheezing, no rhonchi Abdominal: Soft, NT, ND, bowel sounds + Extremities: no edema, no  cyanosis   The results of significant diagnostics from this hospitalization (including imaging, microbiology, ancillary and laboratory) are listed below for reference.     Microbiology: Recent Results (from the past 240 hours)  MRSA Next Gen by PCR, Nasal     Status: None   Collection Time: 02/01/24  6:19 PM   Specimen: Nasal Mucosa; Nasal Swab  Result Value Ref Range Status   MRSA by PCR Next Gen NOT DETECTED NOT DETECTED Final    Comment: (NOTE) The GeneXpert MRSA Assay (FDA approved for NASAL specimens only), is one component of a comprehensive MRSA colonization surveillance program. It is not intended to diagnose MRSA infection nor to guide or monitor treatment for MRSA infections. Test performance is not FDA approved in patients less than 17 years old. Performed at [facility], [address] [place], [place] [address]      Labs: BNP (last 3 results) No results for input(s): BNP in the last 8760 hours. Basic Metabolic Panel: Recent Labs  Lab 02/01/24 1316 02/02/24 0317 02/03/24 0424  NA 141 140 143  K 4.4 3.9 4.3  CL 101 103 107  CO2 27 31 26   GLUCOSE 165* 202* 137*  BUN 32* 23 19  CREATININE 0.59* 0.47* 0.44*  CALCIUM  9.4 8.1* 8.7*  MG 1.6*  --  1.7   Liver Function Tests: Recent Labs  Lab 02/01/24 1316  AST 35  ALT 46*  ALKPHOS 86  BILITOT 0.3  PROT 6.8  ALBUMIN 3.7   No results for input(s): LIPASE, AMYLASE in the last 168 hours. No results for input(s): AMMONIA in the last 168 hours. CBC: Recent Labs  Lab 02/01/24 1316 02/02/24 0317  02/02/24 0814 02/03/24 0424  WBC 3.7* 3.0*  --  3.2*  NEUTROABS 2.6  --   --   --   HGB 10.0* 7.6* 8.3* 8.6*  HCT 32.7* 25.5* 27.3* 28.8*  MCV 94.2 95.5  --  96.0  PLT 246 171  --  195   Cardiac Enzymes: No results for input(s): CKTOTAL, CKMB, CKMBINDEX, TROPONINI in the last 168 hours. BNP: Invalid input(s): POCBNP CBG: Recent Labs  Lab 02/02/24 0729 02/02/24 1132 02/02/24 1619 02/02/24 2142 02/03/24 0730  GLUCAP 125* 146* 108* 135* 130*   D-Dimer No results for input(s): DDIMER in the last 72 hours. Hgb A1c No results for input(s): HGBA1C in the last 72 hours. Lipid Profile No results for input(s): CHOL, HDL, LDLCALC, TRIG, CHOLHDL, LDLDIRECT in the last 72 hours. Thyroid  function studies No results for input(s): TSH, T4TOTAL, T3FREE, THYROIDAB in the last 72 hours.  Invalid input(s): FREET3 Anemia work up Recent Labs    02/01/24 1316 02/02/24 0317 02/02/24 0814  VITAMINB12  --  508  --   FOLATE >20.0  --   --   FERRITIN  --  624*  --   TIBC  --  281  --   IRON   --  40*  --   RETICCTPCT  --   --  1.7   Urinalysis    Component Value Date/Time   COLORURINE YELLOW 09/08/2023 1641   APPEARANCEUR CLEAR 09/08/2023 1641   LABSPEC 1.009 09/08/2023 1641   PHURINE 8.0 09/08/2023 1641   GLUCOSEU NEGATIVE 09/08/2023 1641   HGBUR NEGATIVE 09/08/2023 1641   BILIRUBINUR NEGATIVE 09/08/2023 1641   BILIRUBINUR negative 10/13/2018 1049   KETONESUR NEGATIVE 09/08/2023 1641   PROTEINUR NEGATIVE 09/08/2023 1641   UROBILINOGEN 0.2 10/13/2018 1049   NITRITE NEGATIVE 09/08/2023 1641   LEUKOCYTESUR NEGATIVE 09/08/2023 1641  Sepsis Labs Recent Labs  Lab 02/01/24 1316 02/02/24 0317 02/03/24 0424  WBC 3.7* 3.0* 3.2*   Microbiology Recent Results (from the past 240 hours)  MRSA Next Gen by PCR, Nasal     Status: None   Collection Time: 02/01/24  6:19 PM   Specimen: Nasal Mucosa; Nasal Swab  Result Value Ref Range Status   MRSA by  PCR Next Gen NOT DETECTED NOT DETECTED Final    Comment: (NOTE) The GeneXpert MRSA Assay (FDA approved for NASAL specimens only), is one component of a comprehensive MRSA colonization surveillance program. It is not intended to diagnose MRSA infection nor to guide or monitor treatment for MRSA infections. Test performance is not FDA approved in patients less than 50 years old. Performed at [facility], [address] [place], [place] [address]    Time coordinating discharge:  42 mins   SIGNED:  Afton Louder, MD  [facility] 02/03/2024, 10:29 AM How to contact the [facility] Attending or Consulting provider 7A - 7P or covering provider during after hours 7P -7A, for this patient?  Check the care team in [facility] and look for a) attending/consulting TRH provider listed and b) the TRH team listed Log into [email or web address] and use [place]'s universal password to access. If you do not have the password, please contact the hospital operator. Locate the TRH provider you are looking for under [facility] and page to a number that you can be directly reached. If you still have difficulty reaching the provider, please page the [facility] (Director on Call) for the Hospitalists listed on amion for assistance.     [1]  Allergies Allergen Reactions   Other Swelling    Nuts, swelling of lips and tongue. Also allergic to legumes   Wasp Venom Anaphylaxis   Zolpidem Other (See Comments)    Unknown

## 2024-02-03 NOTE — TOC Transition Note (Signed)
 Transition of Care [facility]) - Discharge Note   Patient Details  Name: Samuel Hubbard MRN: [id number] Date of Birth: 1955-03-07  [facility] CM/SW Contact:  Lucie Lunger, LCSWA Phone Number: 02/03/2024, 10:26 AM   Clinical Narrative:    CSW updated that pt will D/C home today. MD placed [facility] PT orders. CSW provided update to [facility] with [facility] to provide pts update on D/C. TOC signing off.   Final next level of care: Home w Home Health Services Barriers to Discharge: Barriers Resolved   Patient Goals and CMS Choice Patient states their goals for this hospitalization and ongoing recovery are:: return home CMS Medicare.gov Compare Post Acute Care list provided to:: Patient Choice offered to / list presented to : Patient      Discharge Placement                       Discharge Plan and Services Additional resources added to the After Visit Summary for   In-house Referral: Clinical Social Work Discharge Planning Services: CM Consult Post Acute Care Choice: Home Health                    HH Arranged: PT [facility] Agency: [facility] Date [facility] Agency Contacted: 02/03/24   Representative spoke with at [facility] Agency: Holli  Social Drivers of Health (SDOH) Interventions SDOH Screenings   Food Insecurity: No Food Insecurity (02/01/2024)  Housing: Low Risk (02/01/2024)  Transportation Needs: No Transportation Needs (02/01/2024)  Utilities: Not At Risk (02/01/2024)  Alcohol Screen: Low Risk (05/13/2023)  Depression (PHQ2-9): High Risk (01/20/2024)  Financial Resource Strain: Low Risk (01/09/2024)  Physical Activity: Insufficiently Active (01/09/2024)  Social Connections: Moderately Isolated (02/01/2024)  Stress: Stress Concern Present (01/09/2024)  Tobacco Use: Low Risk (02/01/2024)  Health Literacy: Adequate Health Literacy (05/13/2023)     Readmission Risk Interventions    02/02/2024    1:42 PM  Readmission Risk Prevention Plan  Transportation  Screening Complete  Home Care Screening Complete  Medication Review (RN CM) Complete

## 2024-02-04 ENCOUNTER — Telehealth: Payer: Self-pay

## 2024-02-04 NOTE — Transitions of Care (Post Inpatient/ED Visit) (Signed)
° °  02/04/2024  Name: Samuel Hubbard MRN: [id number] DOB: 12-Sep-1955  Today's TOC FU Call Status: Today's TOC FU Call Status:: Unsuccessful Call (2nd Attempt) Unsuccessful Call (2nd Attempt) Date: 02/04/24  Attempted to reach the patient regarding the most recent Inpatient/ED visit.  RN CM left confidential voice mail with patient.   Follow Up Plan: Additional outreach attempts will be made to reach the patient to complete the Transitions of Care (Post Inpatient/ED visit) call.    Bing Edison MSN, RN RN Case Sales [facility]  [facility] Office Hours M-F [phone] Direct Dial: [phone] Main Phone [phone]  Fax: [phone] [email or web address]

## 2024-02-04 NOTE — Transitions of Care (Post Inpatient/ED Visit) (Signed)
° °  02/04/2024  Name: Fabion Gatson MRN: [id number] DOB: 1955/05/17  Today's TOC FU Call Status: Today's TOC FU Call Status:: Unsuccessful Call (1st Attempt) Unsuccessful Call (1st Attempt) Date: 02/04/24  Attempted to reach the patient regarding the most recent Inpatient/ED visit.  Follow Up Plan: Additional outreach attempts will be made to reach the patient to complete the Transitions of Care (Post Inpatient/ED visit) call.    Bing Edison MSN, RN RN Case Sales [facility]  [facility] Office Hours M-F [phone] Direct Dial: [phone] Main Phone [phone]  Fax: [phone] [email or web address]

## 2024-02-05 ENCOUNTER — Telehealth: Payer: Self-pay

## 2024-02-05 NOTE — Transitions of Care (Post Inpatient/ED Visit) (Signed)
" ° °  02/05/2024  Name: Samuel Hubbard MRN: [id number] DOB: 29-Oct-1955  Today's TOC FU Call Status: Today's TOC FU Call Status:: Unsuccessful Call (3rd Attempt) Unsuccessful Call (3rd Attempt) Date: 02/05/24  Attempted to reach the patient regarding the most recent Inpatient/ED visit.  Follow Up Plan: No further outreach attempts will be made at this time. We have been unable to contact the patient.  Alan Ee, RN, BSN, CEN Population Health- Transition of Care Team.  [facility] [phone]  "

## 2024-02-09 ENCOUNTER — Inpatient Hospital Stay (HOSPITAL_BASED_OUTPATIENT_CLINIC_OR_DEPARTMENT_OTHER): Admitting: Oncology

## 2024-02-09 VITALS — BP 123/72 | HR 76 | Temp 97.7°F | Resp 18 | Wt 199.0 lb

## 2024-02-09 DIAGNOSIS — E039 Hypothyroidism, unspecified: Secondary | ICD-10-CM

## 2024-02-09 DIAGNOSIS — F329 Major depressive disorder, single episode, unspecified: Secondary | ICD-10-CM | POA: Diagnosis not present

## 2024-02-09 DIAGNOSIS — I4891 Unspecified atrial fibrillation: Secondary | ICD-10-CM

## 2024-02-09 DIAGNOSIS — R1319 Other dysphagia: Secondary | ICD-10-CM | POA: Diagnosis not present

## 2024-02-09 DIAGNOSIS — Z7984 Long term (current) use of oral hypoglycemic drugs: Secondary | ICD-10-CM | POA: Diagnosis not present

## 2024-02-09 DIAGNOSIS — Z79890 Hormone replacement therapy (postmenopausal): Secondary | ICD-10-CM | POA: Diagnosis not present

## 2024-02-09 DIAGNOSIS — Z7952 Long term (current) use of systemic steroids: Secondary | ICD-10-CM | POA: Diagnosis not present

## 2024-02-09 DIAGNOSIS — R112 Nausea with vomiting, unspecified: Secondary | ICD-10-CM | POA: Diagnosis not present

## 2024-02-09 DIAGNOSIS — E038 Other specified hypothyroidism: Secondary | ICD-10-CM

## 2024-02-09 DIAGNOSIS — C155 Malignant neoplasm of lower third of esophagus: Secondary | ICD-10-CM

## 2024-02-09 DIAGNOSIS — G893 Neoplasm related pain (acute) (chronic): Secondary | ICD-10-CM

## 2024-02-09 DIAGNOSIS — R1310 Dysphagia, unspecified: Secondary | ICD-10-CM | POA: Diagnosis not present

## 2024-02-09 DIAGNOSIS — K219 Gastro-esophageal reflux disease without esophagitis: Secondary | ICD-10-CM

## 2024-02-09 DIAGNOSIS — E890 Postprocedural hypothyroidism: Secondary | ICD-10-CM | POA: Diagnosis not present

## 2024-02-09 DIAGNOSIS — Z7901 Long term (current) use of anticoagulants: Secondary | ICD-10-CM | POA: Diagnosis not present

## 2024-02-09 DIAGNOSIS — G629 Polyneuropathy, unspecified: Secondary | ICD-10-CM | POA: Diagnosis not present

## 2024-02-09 DIAGNOSIS — Z79899 Other long term (current) drug therapy: Secondary | ICD-10-CM | POA: Diagnosis not present

## 2024-02-09 MED ORDER — ONDANSETRON 4 MG PO TBDP: 4.0000 mg | ORAL_TABLET | Freq: Three times a day (TID) | ORAL | 2 refills | Status: AC | PRN

## 2024-02-09 MED ORDER — OMEPRAZOLE 20 MG PO TBDD: 40.0000 mg | DELAYED_RELEASE_TABLET | Freq: Two times a day (BID) | ORAL | 3 refills | Status: DC

## 2024-02-09 MED ORDER — OXYCODONE HCL 5 MG/5ML PO SOLN: 5.0000 mg | ORAL | 0 refills | Status: DC | PRN

## 2024-02-09 NOTE — Progress Notes (Signed)
 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 male with stage III (T3 N0 M0 G3) poorly differentiated adenocarcinoma of distal esophagus   Assessment and Plan  Malignant neoplasm of the lower third of the esophagus  Stage III adenocarcinoma of distal esophagus S/p perioperative chemotherapy with FLOT X 8 cycles Had an attempted esophagectomy at [facility] but aborted due evidence of bulky tumor at [facility], splenic, and omental metastases and metastases localized to gastric fundus and body limiting width of potential conduit.  Omental nodule biopsies did not show any malignancy 11/02/2023: Started chemo RT with carboplatin  and paclitaxel   - Completed chemo RT with good response on CT scan. - The 8 mm paraesophageal node is very similar to how it was prior.  Recommended monitoring on the tumor board. - Labs reviewed today from 02/03/2024: CMP: Normal creatinine, normal LFTs, CBC: WBC: 3.2, hemoglobin: 8.6, platelets: 195.  Physical exam stable - Will coordinate with Dr. [name] and the surgical team at [facility].  As per discussions with Dr. [name] and Dr. [name], surgical resection is not an option anymore. - Will repeat PET scan in 2 months from prior PET scan as per previous discussions.  Return to clinic after PET scan to discuss results and further management  Dysphagia and weight loss secondary to cancer  therapy Dysphagia and weight loss due to radiation therapy.  Patient recently lost some weight today but had education on nutritional supplementation and is on the track of gaining weight.  - Continue feeding tube nutrition as tolerated. - Recommend taking fluids in the tube as tolerated to prevent dehydration.  Peripheral neuropathy Numbness and tingling in extremities likely related to cancer treatment.  Stable at this time  Hypothyroidism post-thyroidectomy Hypothyroidism managed with levothyroxine .  Currently taking 200 mcg daily.  - Continue current thyroid  medication. - Continue to follow-up with primary care who is managing it.  Major depressive disorder Mood symptoms have improved from previous visit.  - Continue sertraline  and mirtazapine  as prescribed.  A-fib Recently admitted to the hospital for A-fib with RVR likely secondary to dehydration Required cardioversion with IV amiodarone .  On full dose Eliquis  anticoagulation.  -Continue Eliquis  as prescribed - Continue to follow with cardiology  GERD Will increase omeprazole  to 40mg  BID and patient prefers a tabs to crush and take through the tube  The patient understands the plans discussed today and is in agreement with them.  He knows to contact our office if he develops concerns prior to his next appointment.  34 minutes of total time was spent for this patient encounter, including preparation, face-to-face counseling with the patient and coordination of care, physical exam, and documentation of the encounter.    LILLETTE Verneta SAUNDERS [name],acting as a neurosurgeon for Siad Davonna, MD.,have documented all relevant documentation on the behalf of Siad Davonna, MD,as directed by  Siad Davonna, MD while in the presence of Siad Davonna, MD.  I, Siad Davonna MD, have reviewed the above documentation for  accuracy and completeness, and I agree with the above.    Mickiel Dry, MD  [facility]  [place] - A DEPT OF [facility] [address] [place] [address] Dept: [phone] Dept Fax: [phone]   Orders Placed This Encounter  Procedures   CBC with Differential/Platelet    Standing Status:   Future    Expected Date:   03/17/2024    Expiration Date:   06/15/2024   Comprehensive metabolic panel with GFR    Standing Status:   Future    Expected Date:   03/17/2024    Expiration Date:   06/15/2024   Ferritin    Standing Status:   Future    Expected Date:   03/17/2024    Expiration Date:   06/15/2024   Folate    Standing Status:   Future    Expected Date:   03/17/2024    Expiration Date:   06/15/2024   Vitamin B12    Standing Status:   Future    Expected Date:   03/17/2024    Expiration Date:   06/15/2024   TSH    Standing Status:   Future    Expected Date:   03/17/2024    Expiration Date:   06/15/2024   T4, free    Standing Status:   Future    Expected Date:   03/17/2024    Expiration Date:   06/15/2024   Iron  and TIBC    Standing Status:   Future    Expected Date:   03/17/2024    Expiration Date:   06/15/2024     ONCOLOGY HISTORY:   I have reviewed his chart and materials related to his cancer extensively and collaborated history with the patient. 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  Pathology: Poorly differentiated adenocarcinoma with mucinous and signet ring cell features. -01/05/2023: CT CAP:  Substantial irregular wall thickening of the distal half of the thoracic esophagus extending over a 8.5 cm length, compatible with tumor. No findings of metastatic disease in the chest, abdomen, or pelvis. -01/22/2023: Initial PET: Circumferential wall thickening in the distal third of the  thoracic esophagus, with maximum SUV 5.7, compatible with malignancy. -01/29/2023: Germline mutation testing: Negative with the VUS of APC and RET -02/02/2023: EGD/EUS: Large fungating and ulcerating mass in the distal esophagus, at the GE junction extending into the cardia, 32 to 42 cm from the incisors. Mass was partially obstructing and circumferential. Sonographic evidence suggesting invasion into the muscularis propria and adventitia. 1 enlarged lymph node in the lower paraesophageal mediastinum measuring 4 x 3 mm, which was round, hypoechoic and had well-defined margins. FNB not performed.  -[facility] NGS testing: HER2 (0), MS-stable, TMB-low, CLDN 18-negative -02/16/2023-04/09/2023: 4 cycles of neoadjuvant FLOT (ESOPEC trial) -04/23/2023: Patient evaluated by Dr. Medora rodes surgeon], who recommended esophagectomy. Patient was reluctant to undergo surgery, so proceeded with EGD with dilation and biopsy.  -05/11/2023: EGD: Severe erosion in the distal esophagus consistent with the location of the tumor. The esophagus is significantly narrowed. Biopsies taken at 36, 38, 40 cm.  -Pathology at 36 cm and 38 cm: Atypical cells present suspicious for adenocarcinoma.  -Pathology at 40 cm: Poorly differentiated invasive mucinous adenocarcinoma  with patchy signet ring cells.  -06/04/2023: Patient seen by Dr. Sheilda coons oncologist] at [facility] who recommended 4 additional cycles of FLOT and surgical reevaluation. -06/15/2023 - 07/27/2023: 4 more cycles of FLOT completed -08/27/2023: PET: Mild thickening of distal esophagus with mild hypermetabolic activity. No evidence of hypermetabolic disease in the chest, abdomen or pelvis.  -09/29/2023: CT CAP: No measurable peritoneal or omental disease. Small volume trace free fluid versus ill-defined omental/peritoneal implants as above. Diffuse wall thickening of the gastroesophageal junction, compatible with malignancy.  Bilateral lower lung predominant  consolidation and groundglass, suspicious for aspiration and atelectasis, infection could have a similar appearance. Trace bilateral pleural effusions.  -09/29/2023: Attempted esophagectomy with Dr.Salfity at [facility]  but aborted due evidence of bulky tumor at [facility], splenic, and omental metastases and metastases localized to gastric fundus and body limiting width of potential conduit. A j-tube was placed. Omental nodules were biopsied -09/29/2023: Right omentum nodule biopsy.  -Pathology of right omentum nodule 2: Mesothelial-lined fibroadipose tissue and smooth muscle with no pathologic change. Negative for malignancy. -Pathology of right omental nodule 1: Fibroadipose tissue with necrosis. Negative for malignancy. -11/02/2023-12/07/2023: ChemoRT with carboplatin  and paclitaxel . (Chose not 5-FU based regimen as patient did not have great response to 5-FU based regimen previously) -01/08/2024: CT CAP: Circumferential wall thickening in the distal esophagus is similar to prior PET-CT. 8 mm short axis paraesophageal node is minimally progressive in the interval with adjacent eccentric focal wall thickening in the proximal anterior esophagus. Innumerable scattered tiny ground-glass nodules in both lungs with scattered solid pulmonary nodules measuring up to 5 mm. No evidence for metastatic disease in the abdomen or pelvis.   Current Treatment: Chemo RT with carboplatin  and paclitaxel   INTERVAL HISTORY:   Samuel Hubbard is returning for a follow up of his esophageal cancer. He is accompanied by his wife today.  Samuel Hubbard reports feeling fatigued and was recently hospitalized due to A-fib for 2 days.   He notes worsened GERD and does not believe omeprazole  20mg  twice daily is effective in treating symptoms. Symptoms include frequent regurgitation of esophageal secretions, coughs for multiple hours a day, and nausea. He is restarting speech therapy as he is unable to swallow foods, making him dependent on tube  feeds. GERD symptoms worsen when he attempts to swallow.   His mood has not improved since starting Remeron  and Ativan . He attributes his mood to anger at being frequently sick. He has not scheduled with a therapist, though he is willing to do so.    He has not had a BM for 2 weeks. He denies any abdominal pain or feeling constipation. He has taken Miralax  without improvement in symptoms.    He was recently prescribed a multi-vitamin mineral that he states upset his stomach. I recommended he try a liquid multivitamin OTC.   I have reviewed the past medical history, past surgical history, social history and family history with the patient and they are unchanged from previous note.  ALLERGIES:  is allergic to other, wasp venom, zolpidem, and valsartan.  MEDICATIONS:  Current Outpatient Medications  Medication Sig Dispense Refill   amiodarone  (PACERONE ) 200 MG tablet 1 po BID thru 02/23/24 then 1 po daily 50 tablet 1   apixaban  (ELIQUIS ) 5 MG TABS tablet Take 5 mg by mouth 2 (two) times daily.     atorvastatin  (LIPITOR) 40 MG tablet Take 1 tablet (40 mg total) by mouth daily. 90 tablet 3   citalopram  (CELEXA ) 20 MG tablet Take 1 tablet (20 mg total)  by mouth daily. 90 tablet 3   levothyroxine  (SYNTHROID ) 200 MCG tablet Take 1 tablet (200 mcg total) by mouth daily. 90 tablet 1   lidocaine -prilocaine  (EMLA ) cream Apply to affected area once 30 g 3   LORazepam  (ATIVAN ) 0.5 MG tablet Take 1 tablet (0.5 mg total) by mouth 2 (two) times daily. 60 tablet 2   metFORMIN  (GLUCOPHAGE ) 500 MG tablet Take 2 tablets (1,000 mg total) by mouth 2 (two) times daily with a meal. 120 tablet 3   mirtazapine  (REMERON ) 15 MG tablet Take 1 tablet (15 mg total) by mouth at bedtime. 30 tablet 2   Multiple Vitamin (MULTIVITAMIN WITH MINERALS) TABS tablet Place 1 tablet into feeding tube daily.     nitroGLYCERIN  (NITROSTAT ) 0.4 MG SL tablet Place 1 tablet (0.4 mg total) under the tongue every 5 (five) minutes as needed for  chest pain. 50 tablet 3   sucralfate  (CARAFATE ) 1 g tablet Take 1 g by mouth 2 (two) times daily.     TYLENOL  CHILDRENS 160 MG/5ML suspension Take 960 mg by mouth every 6 (six) hours as needed for mild pain (pain score 1-3) or moderate pain (pain score 4-6).     Water  For Irrigation, Sterile (FREE WATER ) SOLN Place 180 mLs into feeding tube 5 (five) times daily.     ondansetron  (ZOFRAN -ODT) 4 MG disintegrating tablet Take 1 tablet (4 mg total) by mouth every 8 (eight) hours as needed for nausea or vomiting. 120 tablet 2   oxyCODONE  (ROXICODONE ) 5 MG/5ML solution Take 5 mLs (5 mg total) by mouth every 4 (four) hours as needed for severe pain (pain score 7-10). 473 mL 0   No current facility-administered medications for this visit.    VITALS:  Blood pressure 123/72, pulse 76, temperature 97.7 F (36.5 C), temperature source Tympanic, resp. rate 18, weight 199 lb (90.3 kg), SpO2 100%.  Wt Readings from Last 3 Encounters:  02/09/24 199 lb (90.3 kg)  02/01/24 200 lb 9.9 oz (91 kg)  02/01/24 200 lb 6.4 oz (90.9 kg)    Body mass index is 26.99 kg/m.  Performance status (ECOG): 1 - Symptomatic but completely ambulatory  PHYSICAL EXAM:   GENERAL:alert, no distress and comfortable SKIN: skin color, texture, turgor are normal, no rashes or significant lesions, port site clean LYMPH:  no palpable lymphadenopathy in the cervical, axillary or inguinal LUNGS: clear to auscultation and percussion with normal breathing effort HEART: regular rate & rhythm and no murmurs and no lower extremity edema ABDOMEN:abdomen soft, non-tender and normal bowel sounds, PEG tube site clean Musculoskeletal:no cyanosis of digits and no clubbing  NEURO: alert & oriented x 3 with fluent speech  LABORATORY DATA:  I have reviewed the data as listed  Lab Results  Component Value Date   WBC 3.2 (L) 02/03/2024   NEUTROABS 2.6 02/01/2024   HGB 8.6 (L) 02/03/2024   HCT 28.8 (L) 02/03/2024   MCV 96.0 02/03/2024   PLT  195 02/03/2024     Chemistry      Component Value Date/Time   NA 143 02/03/2024 0424   NA 140 12/01/2022 1513   K 4.3 02/03/2024 0424   CL 107 02/03/2024 0424   CO2 26 02/03/2024 0424   BUN 19 02/03/2024 0424   BUN 17 12/01/2022 1513   CREATININE 0.44 (L) 02/03/2024 0424   CREATININE 1.10 10/13/2018 1054      Component Value Date/Time   CALCIUM  8.7 (L) 02/03/2024 0424   ALKPHOS 86 02/01/2024 1316   AST 35  02/01/2024 1316   ALT 46 (H) 02/01/2024 1316   BILITOT 0.3 02/01/2024 1316   BILITOT 0.3 12/01/2022 1513      Latest Reference Range & Units 01/12/24 15:25  TSH 0.450 - 4.500 uIU/mL 0.163 (L)  T4,Free(Direct) 0.82 - 1.77 ng/dL 8.30  (L): Data is abnormally low  RADIOGRAPHIC STUDIES: I have personally reviewed the radiological images as listed and agreed with the findings in the report. "

## 2024-02-10 ENCOUNTER — Encounter: Payer: Self-pay | Admitting: Oncology

## 2024-02-10 ENCOUNTER — Other Ambulatory Visit: Payer: Self-pay | Admitting: *Deleted

## 2024-02-10 ENCOUNTER — Encounter: Payer: Self-pay | Admitting: Internal Medicine

## 2024-02-10 ENCOUNTER — Telehealth: Admitting: Internal Medicine

## 2024-02-10 DIAGNOSIS — E890 Postprocedural hypothyroidism: Secondary | ICD-10-CM | POA: Diagnosis not present

## 2024-02-10 DIAGNOSIS — R1319 Other dysphagia: Secondary | ICD-10-CM

## 2024-02-10 DIAGNOSIS — Z09 Encounter for follow-up examination after completed treatment for conditions other than malignant neoplasm: Secondary | ICD-10-CM | POA: Insufficient documentation

## 2024-02-10 DIAGNOSIS — R1314 Dysphagia, pharyngoesophageal phase: Secondary | ICD-10-CM | POA: Diagnosis not present

## 2024-02-10 DIAGNOSIS — I4891 Unspecified atrial fibrillation: Secondary | ICD-10-CM | POA: Diagnosis not present

## 2024-02-10 DIAGNOSIS — K5909 Other constipation: Secondary | ICD-10-CM | POA: Diagnosis not present

## 2024-02-10 DIAGNOSIS — K219 Gastro-esophageal reflux disease without esophagitis: Secondary | ICD-10-CM | POA: Diagnosis not present

## 2024-02-10 MED ORDER — OMEPRAZOLE 20 MG PO TBDD: 40.0000 mg | DELAYED_RELEASE_TABLET | Freq: Two times a day (BID) | ORAL | 3 refills | Status: DC

## 2024-02-10 NOTE — Patient Instructions (Signed)
 Please start taking omeprazole  40 mg twice daily.  Please take Benefiber supplement as bulk forming agent.  Okay to take Senokot as needed for constipation.  Please continue to take other medications as prescribed.  Please continue to follow low carb diet and ambulate as tolerated.

## 2024-02-10 NOTE — Assessment & Plan Note (Addendum)
 Could be opioid induced - on oxycodone  for pain control due to esophageal carcinoma Lack of p.o. intake may also be contributing - currently dependent on PEG feeding due to dysphagia Advised to try Benefiber for bulk forming Senokot as needed for constipation

## 2024-02-10 NOTE — Assessment & Plan Note (Addendum)
 He has chronic dysphagia due to esophageal carcinoma, s/p chemoradiation He has PEG tube in place, dependent on PEG feeding currently - planned to see nutritionist

## 2024-02-10 NOTE — Assessment & Plan Note (Addendum)
 Lab Results  Component Value Date   TSH 0.163 (L) 01/12/2024   Reduced dose of levothyroxine  to 200 mcg QD after last TSH check Will recheck TSH and free T4 after 3-4 weeks - on Amiodarone  now for A Fib, which can also affect thyroid  function tests

## 2024-02-10 NOTE — Assessment & Plan Note (Addendum)
 Was attributed to dehydration, had IV amiodarone  conversion during recent hospitalization On oral amiodarone  currently On Eliquis  5 mg BID for [facility] Followed by cardiology
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
## 2024-02-10 NOTE — Assessment & Plan Note (Addendum)
 Hospital chart reviewed, including discharge summary Medications reconciled and reviewed with the patient in detail

## 2024-02-10 NOTE — Assessment & Plan Note (Addendum)
 Continue omeprazole  40 mg BID Has sucralfate  as needed

## 2024-02-15 ENCOUNTER — Telehealth: Payer: Self-pay | Admitting: Pharmacy Technician

## 2024-02-15 ENCOUNTER — Other Ambulatory Visit (HOSPITAL_COMMUNITY): Payer: Self-pay

## 2024-02-15 ENCOUNTER — Encounter: Payer: Self-pay | Admitting: *Deleted

## 2024-02-15 NOTE — Telephone Encounter (Signed)
 Oral Oncology Patient Advocate Encounter  After completing a benefits investigation, prior authorization for omeprazole  20 MG TBDD disintegrating tablet  is not required.  OTC drugs are not covered under medicare part D.   Olympia Adelsberger (Patty) Chet Burnet, CPhT  [facility], [facility] [place], [facility] - Oral Chemotherapy Patient Advocate Specialist III Phone: [phone]  Fax: [phone]

## 2024-02-17 ENCOUNTER — Encounter: Payer: Self-pay | Admitting: Oncology

## 2024-02-19 ENCOUNTER — Telehealth: Payer: Self-pay

## 2024-02-19 ENCOUNTER — Other Ambulatory Visit: Payer: Self-pay

## 2024-02-19 DIAGNOSIS — E1169 Type 2 diabetes mellitus with other specified complication: Secondary | ICD-10-CM

## 2024-02-19 NOTE — Telephone Encounter (Signed)
 Patient called stating he was feeling bad and his BP was 104/77 with a HR of 98 and it his HR had been as high as 150. Patient also stated he hasn't had bowel movement in 2 weeks, but just started taking miralax  yesterday.  Called patient back to check on patient and he stated he was filling some better. Checked his BP while on the phone with nurse and it was 124/89 with HR 74. Patient was told if he started to have chest pains, dizziness, lightheaded, or blacks out to call 911 or if able to go ED. Nurse let patient know to take miralax  2 times daily and if pain starts or  bloating to call back Monday to let us  know so physician can prescribe something for him. Patient acknowledged and understood directions.

## 2024-02-22 ENCOUNTER — Inpatient Hospital Stay: Admitting: Dietician

## 2024-02-23 ENCOUNTER — Telehealth: Payer: Self-pay | Admitting: Dietician

## 2024-02-23 NOTE — Telephone Encounter (Signed)
 Received call back from patient. He reports having difficulties with restarting HH since start of new year. Spent all day yesterday on the phone. He is frustrated.   Patient reports worsening reflux since radiation as well as new onset of vomiting overnight. Patient reports compliant with omeprazole  40 mg 2/day. This has not helped. Patient reports emesis volume is enough to fill up a cup. He is currently doing Nutren 1.5 @ 75 ml/hr x 8 hours (5 cartons). He is still constipated despite Miralax  x2/day. Wife is wondering if this could be related to formula. It does contain soy and he is allergic to legumes. Patient has been on this formula since [facility] discharge (August 2025).   Intervention: Message sent to triage RN at [facility] for assistance with restarting [place] services Suspect increased reflux related to post radiation esophagitis - continue omeprazole  as prescribed  Continue miralax  2/day, suggested 1/4 bottle of mg citrate via tube followed by water  flush Will trial Vital 1.5 as it does not contain soy/pea protein - awaiting return call from [employer] rep to complete request   Brief follow-up Wednesday January 7 via telephone - pt aware

## 2024-02-23 NOTE — Telephone Encounter (Signed)
 Received secure chat from Navigator Vivia) with request to call patient regarding increased reflux. Wife feels this is related to TF. Attempted to contact patient via telephone. Patient did not answer. Left VM with contact information.

## 2024-02-24 ENCOUNTER — Inpatient Hospital Stay: Attending: Hematology | Admitting: Dietician

## 2024-02-24 ENCOUNTER — Telehealth: Payer: Self-pay | Admitting: Dietician

## 2024-02-24 DIAGNOSIS — E890 Postprocedural hypothyroidism: Secondary | ICD-10-CM | POA: Insufficient documentation

## 2024-02-24 DIAGNOSIS — C155 Malignant neoplasm of lower third of esophagus: Secondary | ICD-10-CM | POA: Insufficient documentation

## 2024-02-24 DIAGNOSIS — Z79890 Hormone replacement therapy (postmenopausal): Secondary | ICD-10-CM | POA: Insufficient documentation

## 2024-02-24 NOTE — Telephone Encounter (Signed)
 Brief nutrition follow-up completed with patient via telephone. RD updated patient on concerns voiced yesterday.   Informed patient RD currently awaiting return call from [employer] rep in regards to trying new formula. Requested DME information as Jtube placed at [facility]. Patient to call back with contact. If Vital 1.5 tolerated better, will submit new orders.   Triage nurse is working on [facility] concerns. New referrals to be submitted if indicated.   Patient reports small volume loose stool last night after 1/2 bottle of mag citrate over 2 servings. He will do another 1/4 bottle via tube today. If no movement after 30 minutes, he will give the remaining amount. Patient to continue miralax  twice daily. Instructed to contact [facility] if no BM today. He is understanding.

## 2024-02-25 ENCOUNTER — Telehealth: Payer: Self-pay | Admitting: *Deleted

## 2024-02-25 ENCOUNTER — Other Ambulatory Visit: Payer: Self-pay | Admitting: *Deleted

## 2024-02-25 ENCOUNTER — Other Ambulatory Visit (HOSPITAL_COMMUNITY)

## 2024-02-25 DIAGNOSIS — K219 Gastro-esophageal reflux disease without esophagitis: Secondary | ICD-10-CM

## 2024-02-25 DIAGNOSIS — C155 Malignant neoplasm of lower third of esophagus: Secondary | ICD-10-CM

## 2024-02-25 MED ORDER — LACTULOSE 20 GM/30ML PO SOLN: 20.0000 g | Freq: Every evening | ORAL | 2 refills | Status: AC | PRN

## 2024-02-25 MED ORDER — PANTOPRAZOLE SODIUM 40 MG PO PACK: 40.0000 mg | PACK | Freq: Every day | ORAL | 3 refills | Status: DC

## 2024-02-25 NOTE — Telephone Encounter (Signed)
 Received call from wife stating that he is experiencing constipation.  Per standing orders lactulose  sent to pharmacy and instructions on use given to wife to make sure to flush adequately after administration.  In addition, patient is unable to swallow pills at this point.  Protonix  granules 40 mg daily sent in to instill by tube, per Dr. Armanda recommendations.  Verbalized understanding.

## 2024-02-29 ENCOUNTER — Encounter: Payer: Self-pay | Admitting: *Deleted

## 2024-02-29 ENCOUNTER — Other Ambulatory Visit: Payer: Self-pay | Admitting: *Deleted

## 2024-02-29 NOTE — Progress Notes (Unsigned)
 Received approval for Pantoprazole  packets from [facility] from 02/29/2024-02/28/2025.  No approval number provided.

## 2024-02-29 NOTE — ED Provider Notes (Signed)
 " [facility] EMERGENCY DEPT  ED Provider Note History   Chief Complaint  Patient presents with   Feeding Tube Check    History of Present Illness    Patient is a 69 yo M PMH significant for esophageal cancer (not on chemo), HTN, thyroid  cancer, T2DM, pheochromocytoma who is presenting today for approximately two months of progressive nausea and vomiting.   Limited history provided by patient. Majority of information provided by wife.   He last had a tube feed yesterday evening, but has had no intake today aside from water . Wife is concerned that he appears to vomit after the feeds and contacted his oncology team regarding concerns for his J tube. It was recommended they come to the ED for further evaluation. Denies any recent infectious symptoms.   No recent head trauma or injuries. He is on anticoagulation for atrial fibrillation - last dose this AM. Denies any recent procedures. J tube was placed in August.   1730: Wife began to note abnormal speech, slurred speech, confusion en route to hospital. States it sounds similar to when he speaks in his sleep.  Patient Info:    History provided by:  Relative   Language interpreter used: No    Level of Interpreter Services: No interpreter needed (no language barrier)  Past Medical History:  Diagnosis Date   Arthritis    Hypertension    Pheochromocytoma 02/18/1987   Thyroid  cancer (CMS/HHS-HCC) 02/18/2000   Thyroid  disease    Type 2 diabetes mellitus (CMS/HHS-HCC)    Past Surgical History:  Procedure Laterality Date   ADRENALECTOMY Right 1986   THYROIDECTOMY TOTAL  2002   MICRODISCECTOMY TUBULAR W/ROOT DECOMP Left 04/28/2012   Procedure: Left L4-5 Lumbar microdiscectomy ;  Surgeon: Ozell CHRISTELLA Lyme, MD;  Location: [facility];  Service: Neurosurgery;  Laterality: Left;   LAMINECTOMY POSTERIOR LUMBAR FACETECTOMY & FORAMINOTOMY W/DECOMP Left 04/28/2012   Procedure: Partial L4, Partial L5 LAMINECTOMY POSTERIOR LUMBAR  FACETECTOMY & FORAMINOTOMY W/DECOMP;  Surgeon: Ozell CHRISTELLA Lyme, MD;  Location: [facility];  Service: Neurosurgery;  Laterality: Left;   APPLICATION EPISCLERAL RADIOACTIVE PLAQUE Left 03/17/2019   Procedure: DESTRUCTION OF LOCALIZED LESION OF RETINA, 1 OR MORE SESSIONS; APPLICATION EPISCLERAL RADIOACTIVE PLAQUE;  Surgeon: Beatris Emil Shields, MD;  [facility];  Service: Ophthalmology;  Laterality: Left;   ASPIRATION FINE NEEDLE ORBITAL CONTENTS Left 03/17/2019   Procedure: FINE NEEDLE ASPIRATION OF ORBITAL CONTENTS;  Surgeon: Beatris Emil Shields, MD;  [facility];  Service: Ophthalmology;  Laterality: Left;   REMOVAL EPISCLERAL RADIOACTIVE PLAQUE Left 03/21/2019   Procedure: DESTRUCTION OF LOCALIZED LESION OF RETINA, 1 OR MORE SESSIONS; REMOVAL EPISCLERAL RADIOACTIVE PLAQUE;  Surgeon: Beatris Emil Shields, MD;  [facility];  Service: Ophthalmology;  Laterality: Left;   INJECTION INTRAVITREAL PHARMACOLOGIC AGENT Left 03/21/2019   Procedure: AVASTIN ---INTRAVITREAL INJECTION OF A PHARMACOLOGIC AGENT (SEPARATE PROCEDURE);  Surgeon: Beatris Emil Shields, MD;  [facility];  Service: Ophthalmology;  Laterality: Left;   DESTRUCTION MALIGNANT SKIN LESION FACE/EAR/EYE/LID/NOSE/LIP/MUCOUS MEMBRANE Left 03/21/2019   Procedure: DESTRUCTION, MALIGNANT LESION (EG, LASER SURGERY, ELECTROSURGERY, CRYOSURGERY, CHEMOSURGERY, SURGICAL CURETTEMENT), FACE/EAR/EYELID/NOSE/LIP/MUCOUS MEMBRANE; LESION DIAMETER 1.1 TO 2.0 CM;  Surgeon: Beatris Emil Shields, MD;  [facility];  Service: Ophthalmology;  Laterality: Left;   ESOPHAGOSCOPY W/INSERTION GUIDE WIRE N/A 05/11/2023   Procedure: ESOPHAGOSCOPY, FLEXIBLE, TRANSORAL; WITH INSERTION OF GUIDE WIRE FOLLOWED BY PASSAGE OF DILATOR(S) OVER GUIDE WIRE;  Surgeon: Medora Debby LABOR, MD;  Location: [facility];  Service: Cardiothoracic;  Laterality: N/A;   ESOPHAGOGASTRODOUDENOSCOPY W/BIOPSY N/A 05/11/2023    Procedure: ESOPHAGOGASTRODUODENOSCOPY, FLEXIBLE, TRANSORAL; WITH BIOPSY, SINGLE OR MULTIPLE;  Surgeon: Medora Debby LABOR, MD;  Location: [facility];  Service: Cardiothoracic;  Laterality: N/A;   ROBOT ASSISTED LAPAROSCOPIC ESOPHAGOGASTRECTOMY IVOR-LEWIS N/A 09/29/2023   Procedure: **ROBOT ASSISTED** LAPAROSCOPY, SURGICAL; ESOPHAGOGASTRECTOMY IVOR-LEWIS;  Surgeon: [name] Marianne Monarch, MD;  Location: [facility];  Service: Cardiothoracic;  Laterality: N/A;   EGD N/A 09/29/2023   Procedure: ESOPHAGOGASTRODUODENOSCOPY, FLEXIBLE, TRANSORAL; DIAGNOSTIC, INCLUDING COLLECTION OF SPECIMEN(S) BY BRUSHING OR WASHING, WHEN PERFORMED (SEPARATE PROCEDURE);  Surgeon: [name] Marianne Monarch, MD;  Location: [facility];  Service: Cardiothoracic;  Laterality: N/A;   ROBOT ASSISTED JEJUNOSTOMY  N/A 09/29/2023   Procedure: ROBOT ASSISTED LAPAROSCOPY, SURGICAL; JEJUNOSTOMY (EG, FOR DECOMPRESSION OR FEEDING);  Surgeon: [name] Marianne Monarch, MD;  Location: [facility];  Service: Cardiothoracic;  Laterality: N/A;   ARTHROSCOPY KNEE Bilateral    EYE SURGERY     Family History  Problem Relation Age of Onset   Cancer Mother    Heart disease Father    Diabetes type II Father    Myocardial Infarction (Heart attack) Father    Anesthesia problems Neg Hx    Malignant hypertension Neg Hx    Malignant hyperthermia Neg Hx    Basal cell carcinoma Neg Hx    Blindness Neg Hx    Cataracts Neg Hx    Macular degeneration Neg Hx    Thyroid  disease Neg Hx    Vision loss Neg Hx    Social History   Socioeconomic History   Marital status: Married  Tobacco Use   Smoking status: Never   Smokeless tobacco: Never  Vaping Use   Vaping status: Never Used  Substance and Sexual Activity   Alcohol use: Not Currently    Alcohol/week: 3.0 standard drinks of alcohol    Types: 2 Glasses of wine, 1 Standard drinks or equivalent per week   Drug use: No   Sexual  activity: Yes    Partners: Female   Social Drivers of Corporate Investment Banker Strain: Low Risk (01/09/2024)   Received from [facility]   Overall Financial Resource Strain (CARDIA)    How hard is it for you to pay for the very basics like food, housing, medical care, and heating?: Not hard at all  Food Insecurity: No Food Insecurity (02/01/2024)   Received from [facility]   Hunger Vital Sign    Within the past 12 months, you worried that your food would run out before you got the money to buy more.: Never true    Within the past 12 months, the food you bought just didn't last and you didn't have money to get more.: Never true  Transportation Needs: No Transportation Needs (02/01/2024)   Received from [facility] - Transportation    In the past 12 months, has lack of transportation kept you from medical appointments or from getting medications?: No    In the past 12 months, has lack of transportation kept you from meetings, work, or from getting things needed for daily living?: No  Physical Activity: Insufficiently Active (01/09/2024)   Received from [facility]   Exercise Vital Sign    On average, how many days per week do you engage in moderate to strenuous exercise (like a brisk walk)?: 1 day    On average, how many minutes do you engage in exercise at this level?: 10 min  Stress: Stress Concern Present (01/09/2024)  Received from [facility] - Occupational Stress Questionnaire    Do you feel stress - tense, restless, nervous, or anxious, or unable to sleep at night because your mind is troubled all the time - these days?: To some extent  Social Connections: Moderately Isolated (02/01/2024)   Received from [facility]   Social Connection and Isolation Panel    In a typical week, how many times do you talk on the phone with family, friends, or neighbors?: Twice a week    How often do you get together with friends or  relatives?: Once a week    How often do you attend church or religious services?: Never    Do you belong to any clubs or organizations such as church groups, unions, fraternal or athletic groups, or school groups?: No    How often do you attend meetings of the clubs or organizations you belong to?: Never    Are you married, widowed, divorced, separated, never married, or living with a partner?: Married  Housing Stability: Low Risk (10/13/2023)   Received from [facility]   Housing    Within the past 12 months, have you ever stayed: outside, in a car, in a tent, in an overnight shelter, or temporarily in someone else's home(i.e.couch-surfing)?: No    Are you worried about losing your housing?: No   Review of Systems  All other systems reviewed and are negative.   Physical Exam  BP (!) 83/64   Pulse 74   Temp 36.5 C (97.7 F)   Resp 20   SpO2 100%  Physical Exam Vitals and nursing note reviewed.  Constitutional:      Appearance: He is ill-appearing.  Eyes:     Pupils: Pupils are equal, round, and reactive to light.  Cardiovascular:     Rate and Rhythm: Tachycardia present. Rhythm irregular.  Abdominal:     Palpations: Abdomen is soft.     Comments: J tube LLQ with sediment noted in tube No obvious surrounding erythema or drainage from insertion site   Neurological:     Mental Status: He is disoriented.     Comments: Intermittently follows commands  Moving all extremities spontaneously     Physical Exam   Procedures  Procedures   Results  Medical Decision Making   Medical Decision Making     Assessment and Plan:     Patient is a 69 yo M PMH significant for esophageal cancer (not on chemo), HTN, thyroid  cancer, T2DM, pheochromocytoma who is presenting today for approximately two months of progressive nausea and vomiting, concerns regarding J tube malfunctioning. Upon arrival, wife noted that patient appeared to be altered. He began to have slurred speech,  word salad, episodes of aphasia. Wife states this altered mental status and speech changes have not previously occurred.   POC glucose 110.   6:09 PM Initiating stroke code.   BP improved to 130s/70s. Given Metop for afib with RVR. Wife does report history of afib that tends to occur when patient is ill.   No obvious bleed, area of hypoattenuation, mass on CT brain without.   6:56 PM Patient receiving Ketamine to complete scans and due to lack of cooperation, movement in scanner. Continuing to closely monitor. Pads placed on patient. Respiratory therapy at bedside.   No acute abnormalities noted on CTA.   7:19 PM Patient given another 50 of ketamine for CTPE.   Shock panel: Lactate of 3.8.   CBC: Hgb  12.2. BNP slightly elevated. Tox screen negative.   9:01 PM Mild aspiration in lungs, no evidence of PE.  COVID and Influenza negative. Troponins 8, 10.   CTPE: No evidence of acute pulmonary embolism.  Interval increase in thick-walled and patulous esophagus with interval increase in secretions within the esophagus consistent with patient's known history of esophageal cancer.  Small tree-in-bud opacities predominantly within the bilateral lung bases concerning for mild infection or aspiration.   CT A/P: Impression:  No acute finding in the abdomen or pelvis.  Redemonstrated distal esophageal mass with upstream esophageal distention containing fluid placing patient at risk for aspiration. See same-day CT chest. Jejunostomy tube in place. No bowel obstruction.  No evidence of metastatic disease in abdomen or pelvis.  Patient more alert throughout stay, but still confused regarding events today and unable to provide history. He is resting more comfortably in room. Wife remains at bedside. Updated patient and family on labs and imaging thus far.   9:45 PM Consulted oncology.   10:00 PM Oncology coming to evaluate patient.  Differential Diagnosis:    In addition to the differential  diagnoses listed, other diagnoses were considered.      Encephalopathy, Atrial Fibrillation with RVR, Dehydration/Electrolyte Derangements, CVA, Worsening Metastatic Disease, Infection  Medical Complexity:    New and requires workup.     Pertinent labs & imaging results that were available during my care of the patient were reviewed by me and considered in my medical decision making.     I reviewed previous medical records.         December admission for afib, worsening anemia, hyperglycemia, generalized weakness    I discussed the patient with another provider.         Neurology   ED Course as of 02/29/24 WINDELL Kitchens Feb 29, 2024  8066 Patient has now returned from CT.  Will await labs, imaging, neurorecommendations, observe closely [SS]    ED Course User Index [SS] Rosemarie Elouise Heinz, DO        ED Clinical Impression  1. Nausea without vomiting         DISPO: Admission to oncology.  Patient and wife updated on all labs and imaging results and informed of plans to admit to the hospital. All questions answered at this time. Patient agreeable with plan. Vital signs stable at this time.    Case discussed with attending physician, Dr. Rosemarie, who also evaluated this patient.   Raguel FABIENE Sabot, PA-C Emergency Medicine PA [facility]  Parts of this note may have been written using Dragon dictation. There may be spelling and/or grammatical errors and phrases that were mistakenly interpreted by the software and missed in my review.       This note has been created using automated tools and reviewed for accuracy by AMELIA ROSE BALLESTEROS-EDWARDS.   AI Note Feedback-- Point of Care Survey   Sabot Raguel Ee, [place] 03/01/24 1406  "

## 2024-03-01 NOTE — Progress Notes (Signed)
 Brief Neurology Consults Follow-Up Note  Samuel Hubbard is a 69 y.o. male atrial fibrillation (on apixaban ), diabetes mellitus, hypertension, HLF, OSA on CPAP, history of pheochromocytoma, clinical stage III distal esophageal adenocarcinoma s/p j-tube placed (09/2023) presenting to ED with c/f feeding tube obstruction. Stroke code called for acute AMS with c/f aphasia.   Consult was staffed at bedside with attending Dr. Margette this AM.    Interim hx & results:  1/12: Pt wife reports that pt developed acute onset AMS, incomprehensible speech at 5:30 PM en route to hospital. At baseline alert and oriented x4. Initial NIHSS of 23. Tachycardic with soft bps in ED, given metoprolol  5 mg for rate control. Elevated lactate (initial 3.7), normal WBC, toxic screen neg, electrolyte abnormalities (initial K+ 2.9, calcium  7.2). Became acutely agitated in CT scanner, given midazolam  and ketamine. CT brain w/o contrast showed no acute intracranial abnormalities. CTA head unremarkable, no LVO. CT PE concerning for possible BL lower lobe aspiration or infection. Vancomycin/zosyn initiated.   1/13: NIHSS of 1. Alert and oriented x4. Confusion, aphasia, dysarthria resolved. Denies seizure activity (rhythmic motor activity, lip smacking, urinary incontinence). Endorses sore throat, generalized achiness, nausea/vomiting.    Physical exam:   General: Well appearing. In no distress. HEENT: Normocephalic. Atraumatic. OP clear and moist. Pulmonary:  Normal work of breathing.  Cardiovascular: Warm, well perfused.  Extremity: No edema. Neurologic Exam: Mental Status: Awake. Alert. Oriented to person, place, and time. Speech: No aphasia. No dysarthria. Naming and repetition intact. Cranial Nerves:    II,III: Pupils 3 mm and reactive to light bilaterally. VFF by confrontation.     III,IV,VI: EOMI w/o nystagmus. No ptosis   V: Sensation intact to light touch   VII: Face symmetric without weakness. Slight L  nasolabial fold asymmetry at baseline (patient reports face looks symmetric when shown on phone)   VIII: Hearing to voice intact   IX,X: Voice normal, palate elevates symmetrically   XI: SCM/trap full   XII: Tongue protrudes midline. No atrophy or fasciculation Motor: Normal bulk and tone. No arm drift.   Strength: Dlt Bic Tri WE WF FgS Grp HF KF KE PF DF    Left 5 5 5 5 5 5 5 5 5 5 5 5     Right 5 5 5 5 5 5 5 5 5 5 5 5   Sensation: Intact to light touch throughout. No extinction to DSS.  Reflexes: 2+ throughout, symmetric.  Gait: Deferred.   NIHSS (03/01/24 AM):   Mental Status:   1a  Level of consciousness: 0=alert; keenly responsive  1b. LOC questions:  0=Performs both tasks correctly  1c. LOC commands: 0=Performs both tasks correctly   CN:   2.  Best Gaze: 0=normal  3. Visual: 0=No visual loss  4. Facial Palsy: 1=Minor paralysis (flattened nasolabial fold, asymmetric on smiling)   Motor:   5a. Motor left arm: 0=No drift, limb holds 90 (or 45) degrees for full 10 seconds  5b.  Motor right arm: 0=No drift, limb holds 90 (or 45) degrees for full 10 seconds  6a. motor left leg: 0=No drift, limb holds 90 (or 45) degrees for full 10 seconds  6b  Motor right leg:  0=No drift, limb holds 90 (or 45) degrees for full 10 seconds   Cortical   7. Limb Ataxia: 0=Absent  8.  Sensory: 0=Normal; no sensory loss  9. Best Language:  0=No aphasia, normal  10. Dysarthria: 0=Normal  11. Extinction and Inattention: 0=No abnormality   Total:   1  Imaging:  CT brain w/o contrast (02/29/24):  IMPRESSION:  No acute intracranial abnormalities.   CT head and neck angiogram (02/29/24):  IMPRESSION: 1.  See contemporaneous CT brain for detailed report on intracranial parenchymal findings. 2.  No intracranial large vessel occlusion. 3.  No hemodynamically significant arterial stenosis in the neck.    UPDATED RECOMMENDATIONS:  The patient's episode of AMS and dysarthria remains consistent with  encephalopathic orientation, likely in the setting of months of feeding dysfunction, nausea/vomiting, electrolyte abnormalities, and afib with RVR. Given resolved confusion and NIHSS of 1 (L decrease nasolabial fold) that is chronic, no further recommendations from neurology. Low suspicion of vascular especially with negative neuroimaging (CT/CTA) or seizures at this time.   #AMS, resolved - no further neurological workup - if persistent AMS without return to baseline or any seizure activity (gaze deviation, tonic-clonic movements) please re-engage neurology  Patient's care discussed with primary team. They have no further questions for us  at this time. Neurology will not continue to actively follow, but please page [phone] if any additional questions arise.   VILINDA CANDIE COFFER MED STUDENT  Jacquline Finner, MD Neurology PGY4 **[phone] (Stroke Code and NEW Neurology Consult Pager) **[phone] (Questions for Neurology consults team for Admitted patients and FOLLOWUP questions)  **[phone] (For stroke patients admitted to the CEU)      ------------------------------------------------------------------------------- Attestation signed by Margette Donnice Fallow, MD at 03/01/2024  5:15 PM Attestation Statement:   I personally saw and evaluated the patient, and participated in the management and treatment plan as documented in the resident/fellow note.  MATTHEW FALLOW MARGETTE, MD  -------------------------------------------------------------------------------

## 2024-03-01 NOTE — Progress Notes (Addendum)
 " Medical Oncology Daily Progress Note March 01, 2024 Hospital Day: 2  PATIENT IDENTIFICATION: Samuel Hubbard is a 69 y.o. male with HTN, esophageal cancer who  presented with dyspepsia, nausea, vomiting for 2 months prior to arrival.   HOSPITAL SYNOPSIS: -1/12 seen in ED with concern for feeding tube issues, code stroke called with AMS -Back to baseline mental status  INTERVAL HISTORY:  Back to baseline mental status. Continues to have a lot of burning and mucous production.   PHYSICAL EXAMINATION:   Current Vital Signs 24h Vital Sign Ranges  T 36.7 C (98 F) (03/01/24 0951) Temp  Avg: 36.7 C (98 F)  Min: 36.5 C (97.7 F)  Max: 36.7 C (98.1 F)  BP 115/68 (03/01/24 0951) BP  Min: 83/64  Max: 151/140  HR 77 (03/01/24 0951) Pulse  Avg: 99.4  Min: 64  Max: 143  RR 15 (03/01/24 0951) Resp  Avg: 16.3  Min: 9  Max: 30  O2sat 98 % Nasal cannula (03/01/24 0951) SpO2  Avg: 96.6 %  Min: 80 %  Max: 100 %       Current Shift Ins/Outs 24h Total Ins/Outs  Time Ins Outs No intake/output data recorded. 01/12 0701 - 01/13 0700 In: 3029.9 [I.V.:220.7] Out: 0        Weights   First 87.2 kg (192 lb 3.9 oz) (03/01/24 0117)   Last 87.2 kg (192 lb 3.9 oz) (03/01/24 0117)    GEN: pale, chronically ill apprearing  HEENT: PERRL, EOMI, MMM, OP clear, no mucositis appreciated NECK:  Supple, no JVD, no LAD LUNGS:  CTAB, no W/R/R CV:  RRR, no M/R/G ABD:  Soft, NT/ND, BS+, no masses or organomegaly, intact feeding tube EXT:  Warm, NT, no C/C/E, no edema SKIN:  No rash or lesions appreciated NEURO:  A&Ox3, CN II-XII intact grossly, no gross focal deficit   ASSESSMENT AND PLAN:    Principal Problem:   Aspiration pneumonia of both lower lobes (CMS/HHS-HCC) Active Problems:   Hypothyroidism   Hypertension   Malignant neoplasm of lower third of esophagus (CMS/HHS-HCC)   Coronary artery disease involving native coronary artery of native heart without angina pectoris   Paroxysmal atrial  fibrillation (CMS/HHS-HCC)   Type 2 diabetes mellitus with other specified complication (CMS/HHS-HCC)   Confusion state   Elevated lactic acid level   Acute respiratory failure with hypoxia (CMS/HHS-HCC)   Active Problems  #CANCER DX 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 Lavina Diagnosis Date: 12/2022 Metastasis (if applicable): distal esophagus extending into gastric cardia Number of prior lines of therapy (if applicable): completed 8 cycles of FLOT followed by carbo/paclitaxel  Last Treatment Date: competed chemo RT with carboplatin  and paclitaxel  12/07/23 Surgical History: not a surgical candidate Radiation History: completed 12/07/23  #GERD/Dyspepsia: -CT a/p showing obstructive distal esophageal mass with upstream esophageal distention/fluid retention, no peritoneal disease seen on imaging and prior omental biopsy negative  -increase omeprazole  to 40 BID with scheduled pepcid  20 mg BID -start claritin for secretions -nutrition assisting with tube feed adjustment  -stay as close to 90 degrees upright with feedings -consider adding glycopyrrolate vs reglan   #Aspiration PNA; -zosyn -back on RA  #AMS: -possibly related to hypotension vs aspiration? -CT head WNL, received ketamine in the ED -back to baseline  #constipation: -bowel regimen   #FEN - Electrolytes monitored daily and supplemented as needed  Chronic or Inactive Problems  #Hypothyroid: -synthroid    #A Fib: -continue amiodarone , eliquis ,   #Insomnia #Mood disorder: -continue home  BID prn ativan  -continue home remeron , celexa    Diet:  Active Orders  Diet   DIET NPO Effective Now    VTE Prophylaxis: eliquis   Code Status:Full Code    Discharge Planning: PT/OT pending   HANA MADELEINE GRAIG, MD [facility] Available via pager 03/01/2024 12:14 PM ___________________________________________________   Labs: Recent  Labs  Lab 02/29/24 1941 02/29/24 2356 03/01/24 0316  NA 134* 137 138  K  --  2.9* 3.6  CO2  --  22 28  BUN  --  13 14  CREATININE  --  0.7 0.7  GLUCOSE  --  99 108  CALCIUM   --  7.2* 8.5*  MG  --   --  1.4*   Recent Labs  Lab 02/29/24 2356 03/01/24 0316  ALT 13* 14*  AST 19 13*  ALKPHOS 49 50  TBILI 1.0 0.7  ALB 2.3* 2.5*     Recent Labs  Lab 02/29/24 1934 03/01/24 0316  WBC 7.2 6.6  HGB 12.2* 10.3*  HCT 39.3 33.5*  PLT 241 217   Recent Labs  Lab 02/29/24 1934  APTT 35.9  INR 1.4*    Labs in chart were reviewed.  Microbiology: ---------Misc Cultures---------  Imaging: CT chest PE protocol incl CT angiogram chest w wo contrast Result Date: 03/01/2024 CT CHEST PE  PROTOCOL INCL CT CHEST ANGIOGRAM W WO CONTRAST INDICATION: afib, altered mental status, I48.0 Paroxysmal atrial fibrillation (CMS/HHS-HCC), R41.82 Altered mental status, unspecified COMPARISON EXAMS:  CT chest 09/29/2023 Protocol:  Chest CTA PE Protocol.  Contiguous 1.25 mm axial images were obtained from the neck base through the upper abdomen following intravenous administration of iodinated contrast material.  If IV contrast material had not been administered, the likelihood of detecting abnormalities relevant to the patient's condition would have been substantially decreased.  3-D reconstructions were likewise performed and indicated to increase the sensitivity of detecting clinically relevant pathology. FINDINGS: PULMONARY ARTERIES: Adequate contrast opacification of the pulmonary arteries without evidence of pulmonary embolism. The main pulmonary artery is not dilated. LUNGS/AIRWAYS: The central airways are patent. No suspicious pulmonary opacities or nodules. Groundglass and centrilobular opacities predominantly within the lung bases. PLEURA: No pleural effusion. No pneumothorax. MEDIASTINUM: No mediastinal or hilar lymphadenopathy. HEART/VESSELS: Mild pericardial effusion. Moderate coronary artery  calcifications. Normal caliber of the thoracic aorta. VISIBLE ABDOMEN: Redemonstrated thick-walled and patulous esophagus with interval increase in secretions within the esophagus. Increased mural nodular thickened of the upper esophagus with abutment of the trachea.  The incompletely visualized upper abdomen is unremarkable. BONES/SOFT TISSUES/OTHER: The visualized thyroid  is unremarkable. No axillary lymphadenopathy. No aggressive-appearing osseous lesions. IMPRESSION: 1.  No evidence of acute pulmonary embolism. 2.  Patulous esophagus with interval increase in mural nodular thickening of the upper esophagus and lower esophagus with likely stricturing and interval increase in secretions within the esophagus concerning for worsening esophageal cancer. 3.  Small centrilobular and groundglass opacities predominantly within the bilateral lung bases, concerning for aspiration. Findings were discussed by telephone with  AMELIA ROSE BALLESTEROS-EDWARDS by Kristie Sabot, MD at 02/29/2024 9:02 PM. The preliminary report (critical or emergent communication) was reviewed prior to this dictation and there are no critical differences between the preliminary results and the impressions in this final report. Electronically Reviewed by:  Larri Sabot, MD, [facility] Electronically Reviewed on:  03/01/2024 9:39 AM  CT abdomen pelvis with contrast Result Date: 03/01/2024 CT abdomen and pelvis with IV contrast Comparison:  09/29/2023. Indication:  Abdominal pain, acute, nonlocalized, R11.0 Nausea. Technique:  CT imaging was performed of the abdomen and pelvis following the administration of intravenous contrast.  Iodinated contrast was used due to the indications for the examination, to improve disease detection and further define anatomy. Coronal and sagittal reformatted images were generated and reviewed. Findings: - Lower Thorax: Patulous distal esophagus with some diffuse soft tissue thickening of the distal  esophagus in keeping with esophageal cancer. No suspicious pulmonary abnormalities. No pleural or pericardial effusions. Severe coronary calcifications. - Liver: Normal in morphology and enhancement.  No suspicious hepatic masses are identified.  The portal and hepatic veins are patent. - Biliary and Gallbladder: No intrahepatic or extrahepatic bile duct dilatation. Gallbladder is unremarkable. - Spleen: Normal in appearance.  - Pancreas: Normal in appearance. - Adrenal Glands: Normal in appearance. - Kidneys: Kidneys are symmetric in size and enhancement. No suspicious renal lesions. No hydronephrosis. - Abdominal and Pelvic Vasculature: No abdominal aortic aneurysm. - Gastrointestinal Tract: . No bowel obstruction. Jejunostomy tube is in place. Cecal diverticulum noted - Peritoneum/Mesentery/Retroperitoneum: No free fluid.  No free intraperitoneal air. - Lymph Nodes: No retroperitoneal, mesenteric, pelvic, or inguinal lymphadenopathy.  - Bladder: Normal in appearance. - Pelvic Organs: Dystrophic calcifications in the prostate. Dystrophic calcification in the prostate. - Body Wall: Changes of ventral hernia repair. Persistent periumbilical hernia with mesh repair containing a small portion of nonobstructed small bowel. - Musculoskeletal:  No aggressive appearing osseous lesions. Mild multilevel degenerative disc disease. Moderate bilateral hip osteophyte is. Impression: 1.  No acute finding in the abdomen or pelvis. 2.  Redemonstrated obstructive distal esophageal mass with upstream esophageal distention containing fluid placing patient at risk for aspiration. See same-day CT chest. 3.  Jejunostomy tube in place. No bowel obstruction. 4.  No evidence of metastatic disease in abdomen or pelvis. The preliminary report (critical or emergent communication) was reviewed prior to this dictation and there are no critical differences between the preliminary results and the impressions in this final report. Electronically  Reviewed by:  Jerrell Senior, MD, [facility] Electronically Reviewed on:  03/01/2024 8:10 AM I have reviewed the images and concur with the above findings. Electronically Signed by:  Redell Dawn, MD, [facility] Electronically Signed on:  03/01/2024 9:00 AM  CT head and neck angiogram stroke code with contrast Result Date: 02/29/2024 CTA HEAD AND NECK WITH CONTRAST INDICATION: Neuro deficit, acute, stroke suspected, stroke code, R41.82 Altered mental status, unspecified COMPARISONS: CT neck 09/08/2023, 09/29/2023 CT abdomen pelvis. TECHNIQUE/PROTOCOL: Contrast enhanced head and neck CT arteriogram was performed. 3D reconstructions were performed to evaluate vascular anatomy. All internal carotid measurements were made according to the NASCET criteria. Specifically, if stenosis of the internal carotid artery is reported, the stenosis was calculated as follows: ( 1 - (minimal residual diameter/normal diameter of distal ICA)). BRAIN FINDINGS: See contemporaneous CT brain for detailed report on intracranial parenchymal findings. VASCULATURE FINDINGS: Aortic Arch: No aneurysmal dilation or dissection. Four-vessel arch with the left vertebral artery arising directly from the aorta. Subclavian Arteries: No significant stenosis. Common Carotid Arteries: No significant stenosis. Cervical ICAs:    - Right:  No significant stenosis.    - Left: No significant stenosis. Vertebral Arteries: No significant stenosis. Intracranial ICAs: No significant stenosis. MCAs: No significant stenosis. ACAs:  No significant stenosis. Nonopacified appearance of the left transverse sinus may reflect early phase of contrast timing. Basilar Artery: No significant stenosis. PCAs: No significant stenosis. Posterior Comm Arteries: No significant stenosis. SCAs: Patent. AICAs: Patent. PICAs: Patent. NECK FINDINGS: Lymph Nodes: No enlarged or abnormal appearing  lymph nodes. Suprahyoid Neck: Normal. Infrahyoid Neck: Patulous esophagus with  nodular soft tissue at the anterior margin of the esophagus at the left level of the sternal notch which appears to have increased since the 09/29/2023 CT abdomen pelvis. Chronic traumatic deformity of thyroid  cartilage. Salivary Glands: Normal. Thyroid : Normal. Visualized Thorax: Scattered pulmonary nodular and groundglass opacities which may represent aspiration. Cervical Spine: No high-grade spinal canal stenosis. IMPRESSION: 1.  See contemporaneous CT brain for detailed report on intracranial parenchymal findings. 2.  No intracranial large vessel occlusion. 3.  No hemodynamically significant arterial stenosis in the neck. Electronically Reviewed by:  Frederic Dines, MD, [facility] Electronically Reviewed on:  02/29/2024 10:03 PM I have reviewed the images and concur with the above findings. Electronically Signed by:  Ozell Lansing, MD, [facility] Electronically Signed on:  02/29/2024 10:22 PM  CT brain stroke code without contrast protocol Result Date: 02/29/2024 CT BRAIN WITHOUT CONTRAST INDICATION: stroke code, R41.82 Altered mental status, unspecified COMPARISON: None. TECHNIQUE: Standard noncontrast brain CT. FINDINGS: Brain Parenchyma: There is no hemorrhage, cerebral edema, acute cortical infarction, mass, mass effect, or midline shift. Left greater than right periventricular white matter hypodensities, likely sequela of chronic small vessel ischemic disease. Ventricles and Sulci: Normal for age.  Extra-Axial Spaces: No extra-axial fluid collection. Basal Cisterns: Normal. Paranasal Sinuses: Mucous retention cysts in the right maxillary sinus. Mastoids: Normal. Orbits: Normal. Cranium and Bones: Normal. Soft Tissues: Normal. IMPRESSION: No acute intracranial abnormalities. Findings were discussed by telephone with Dr. Genella by Frederic Dines, MD at 02/29/2024 6:50 PM. Electronically Reviewed by:  Frederic Dines, MD, [facility] Electronically Reviewed on:  02/29/2024 7:41 PM I have reviewed the images  and concur with the above findings. Electronically Signed by:  Ozell Lansing, MD, [facility] Electronically Signed on:  02/29/2024 9:16 PM  US  POC cardiac images Result Date: 02/29/2024 Study Date and Time: 2024-02-29 17:51 Study Author: Elouise Popp Cardiac - ED: Indication(s) for Exam:     Focused cardiac ultrasound was performed for the following indication(s):: Hypotension - I95.9, Tachycardia - R00.0     Other indication(s): N/A Views Obtained:     The following views were obtained:: Parasternal long axis, Parasternal short axis, Apical 4 chamber, Subxiphoid, Inferior vena cava     Other: N/A Findings:     Pericardial Effusion: Absent     If effusion present, evidence of cardiac tamponade?: N/A     Left Ventricular Function: Indeterminate     Right Ventricular Size: Normal     If dilated RV, other signs of RV strain?: N/A     Inferior Vena Cava: Indeterminate/Image not obtained     IVC Max Diameter (cm): N/A     Other Findings: N/A Interpretation:     Focused cardiac ultrasound revealed:: Other, see additional comments below     Additional comments;: LV function difficult to determine due to A-fib with RVR, no large effusion, no obvious RV strain CPT Code(s) That May Apply:     CPT code: 06691 Echocardiography, transthoracic, real time with image documentation (2D) includes M-mode recording when performed; follow up or limited: N/A Signed by Elouise Popp on 2024-02-29 19:49     Scheduled Medications    AMIOdarone   200 mg Via Tube Daily   apixaban   5 mg Via Tube Q12H [facility]   citalopram   20 mg Via Tube Daily   famotidine   20 mg Intravenous Q12H [facility]   insulin  LISPRO (AdmeLOG, HumaLOG) injection  0-12 Units Subcutaneous Q6H [facility]   levothyroxine   200 mcg Via Tube Daily before breakfast (0630)   loratadine  10 mg Via Tube Daily   magnesium  sulfate  4 g Intravenous Once   mirtazapine   15 mg Via Tube QHS   omeprazole  oral suspension 2 mg/mL  40 mg Via Tube BID AC   piperacillin-tazobactam  (ZOSYN) IV Extended Therapy  3.375 g Intravenous Q8H   sennosides  5 mL Via Tube BID   thiamine (Vitamin B-1) injection  200 mg Intravenous Daily     Infusions        PRN Meds   acetaminophen , dextrose  50% in water , glucagon , guaiFENesin, LORazepam , ondansetron , oxyCODONE , polyethylene glycol, traZODone      "

## 2024-03-02 NOTE — Progress Notes (Signed)
 Case Manager Discharge Summary / Closing Note  Expected Discharge Date & Time: 03/02/2024 at 1-4 pm.  Discharge Plan:  The patient has been involved in the development of this plan and is in agreement.    Post-Acute Services Coordinated: Dialysis/Infusion - Admitted Since 02/29/2024     Service Provider Services Address Phone Fax Patient Preferred   [facility]  [facility] [address], [address], [place] [address] [phone] --       Infusion Documentation    Flowsheet Row Most Recent Value  Agency Info / Branch Phone [facility]: [phone]  Start of Care 03/02/24  Medication delivery date and location Formula to home within 24 hrs and pt already has pump at home from previous set up  Medications Arranged Peptamen 1.5 formula or ok for equivalent and Prosource packets  Teachable Caregiver name & phone number Zadiel Leyh (spouse) [phone]  Line care teaching Not Indicated * [Pt came in on TF's already]  Follow Up Provider PCP  Information Provided This agency has Epic / Medlink access and has agreed to retrieve the order and necessary supporting documentation independently    Funding for Discharge Medications: Drug assistance not needed    Transportation: Arrangements: patient arranged Discharge Transportation: private vehicle  Final ADT:  Final ADT Discharge Disposition: Home Based  Home Based: Home or Self Care    Second IMM Received: Refused by patient - physical copy left at bedside (Pt is ready to go home. Didn't need another copy.) (03/02/24 1229)  Final Summary: Pt will return home spouse and both are independent with feeding tube care. Pt aware of formula switch this admission to Peptamen 1.5 that only contains soybean oil and not protein. He and spouse agree this will be ok with his legume allergy and were educated by RD. Team to send him home with 2 bags of formula while order ships out. Pt already has  pump at home and device is working correctly. CM updated [facility] of new enteral order.   SARAH N PARRISH: MSW, ACM-SW Case Manager *Some images could not be shown.

## 2024-03-02 NOTE — Discharge Summary (Signed)
 "  [facility] Discharge Summary  Admit Date: 02/29/2024 Discharge Date: 03/02/2024  Admitting Physician: Alm Lowery Bull, MD Discharge Physician: Graig Karlyne Bourdon, MD  Primary Care Provider: Eldora Olam CROME, MD, Phone [phone]  Discharge Destination: Home  Admission Diagnoses:  Paroxysmal atrial fibrillation (CMS/HHS-HCC) [I48.0] Nausea without vomiting [R11.0] Altered mental status, unspecified altered mental status type [R41.82]  Discharge Diagnoses:  Principal Problem:   Aspiration pneumonia of both lower lobes (CMS/HHS-HCC) Active Problems:   Hypothyroidism   Hypertension   Malignant neoplasm of lower third of esophagus (CMS/HHS-HCC)   Coronary artery disease involving native coronary artery of native heart without angina pectoris   Paroxysmal atrial fibrillation (CMS/HHS-HCC)   Type 2 diabetes mellitus with other specified complication (CMS/HHS-HCC)   Confusion state   Elevated lactic acid level   Acute respiratory failure with hypoxia (CMS/HHS-HCC) Resolved Problems:   * No resolved hospital problems. *  Primary Diagnosis: Admitted for dyspepsia/nausea  Changes Made (with rationale):  Omeprazole  changed to oral suspension 40 mg BID Pepcid  suspension 20 mg BID ordered Oxycodone  increased to 10 ml q4 prn Olanzapine 5 mg started for nausea   To-Do List (incidental findings, follow-up studies, etc.): Make apt to follow up with local oncologist in one week  Anticipatory Guidance for Outpatient Care:  None     Results Pending at Discharge:  None Please see phone numbers at end of this summary for lab contact information.   Follow-up/Care Transition Plan: Sched. appts: Future Appointments  Date Time Provider Department Center  04/20/2024  1:45 PM Materin, Emil Shields, MD [facility]    Follow-up info: No follow-up provider specified.     Allergies/Intolerances:  Allergies  Allergen Reactions   Legumes Swelling    Nutren 1.0 [Nutritional Supplements] Other (See Comments) and Cough    Difficulty swallowing. confusion   Peanut Swelling    Throat swelling and lips   Wasp Venom Swelling   Zolpidem Hallucination     New Adverse Drug Events: none  Medications:     Current Discharge Medication List     START taking these medications      Instructions  amoxicillin-clavulanate 400-57 mg/5 mL suspension Quantity: 44 mL Refills: 0 Stop taking on: March 04, 2024  Commonly known as: AUGMENTIN Take 10.9 mLs (875 mg total) by G tube every 12 (twelve) hours for 2 days. Discard remainder.   famotidine  40 mg/5 mL (8 mg/mL) suspension Quantity: 100 mL Refills: 0  Commonly known as: PEPCID  Take 2.5 mLs (20 mg total) by G tube 2 (two) times daily for 30 days Last time this was given: Ask your nurse or doctor   loratadine 5 mg/5 mL solution Quantity: 240 mL Refills: 0 Start taking on: March 03, 2024  Commonly known as: CLARITIN Take 10 mLs (10 mg total) by G tube once daily for 30 days Last time this was given: 10 mg on March 02, 2024  8:47 AM   OLANZapine 5 MG disintegrating tablet Quantity: 30 tablet Refills: 0  Commonly known as: ZyPREXA ZYDIS Take 1 tablet (5 mg total) by mouth at bedtime for 30 days   omeprazole  2 mg/mL oral suspension Quantity: 1200 mL Refills: 0  Take 20 mLs (40 mg total) by tube 2 (two) times daily before meals for 30 days Last time this was given: 40 mg on March 02, 2024  8:47 AM   polyethylene glycol packet Refills: 0 Start taking on: March 03, 2024  Commonly known as: MIRALAX  Take 1 packet (17 g  total) by G tube once daily Mix in 4-8ounces of fluid prior to taking.   sennosides 8.8 mg/5 mL syrup Refills: 0 Stop taking on: March 12, 2024  Take 5 mLs by G tube 2 (two) times daily for 10 days Last time this was given: 5 mLs on March 02, 2024  8:47 AM       These medications have been CHANGED      Instructions  AMIOdarone  200 MG  tablet Quantity: 30 tablet Refills: 0 Start taking on: March 03, 2024 What changed: how to take this  Commonly known as: PACERONE  Take 1 tablet (200 mg total) by G tube once daily for 30 days Last time this was given: 200 mg on March 02, 2024  8:48 AM   apixaban  5 mg tablet Quantity: 60 tablet Refills: 0 What changed:  how to take this when to take this  Commonly known as: ELIQUIS  Take 1 tablet (5 mg total) by tube every 12 (twelve) hours for 30 days Last time this was given: 5 mg on March 02, 2024  8:48 AM   levothyroxine  200 MCG tablet Quantity: 30 tablet Refills: 0 Start taking on: March 03, 2024 What changed:  how to take this additional instructions  Commonly known as: SYNTHROID  Take 1 tablet (200 mcg total) by G tube every morning before breakfast (0630) for 30 days Take on an empty stomach with a glass of water  at least 30-60 minutes before breakfast. Last time this was given: 200 mcg on March 02, 2024  5:57 AM   LORazepam  0.5 MG tablet Quantity: 30 tablet Refills: 0 What changed:  how to take this when to take this reasons to take this  Commonly known as: ATIVAN  Take 1 tablet (0.5 mg total) by G tube 2 (two) times daily as needed for Anxiety for up to 30 days   mirtazapine  15 MG tablet Quantity: 30 tablet Refills: 0 What changed: how to take this  Commonly known as: REMERON  Take 1 tablet (15 mg total) by G tube at bedtime for 30 days Last time this was given: 15 mg on March 01, 2024  8:52 PM   oxyCODONE  5 mg/5 mL solution Quantity: 473 mL Refills: 0 What changed:  how much to take how to take this reasons to take this  Commonly known as: ROXICODONE  Take 10 mLs (10 mg total) by G tube every 4 (four) hours as needed for up to 30 days Last time this was given: 10 mg on March 02, 2024 11:20 AM       CONTINUE taking these medications      Instructions  CHILDREN'S TYLENOL  160 mg/5 mL suspension Refills: 0 Generic drug: acetaminophen    Take 960 mg by tube every 6 (six) hours as needed (pain)   citalopram  10 mg/5 mL solution Quantity: 240 mL Refills: 0  Commonly known as: CeleXA  Take 10 mLs (20 mg total) by mouth once daily Last time this was given: 20 mg on March 02, 2024  8:48 AM   lidocaine -prilocaine  cream Refills: 0  Commonly known as: EMLA  Apply topically as needed   nitroGLYcerin  0.4 MG SL tablet Refills: 0  Commonly known as: NITROSTAT  Place 0.4 mg under the tongue every 5 (five) minutes as needed   ondansetron  4 MG disintegrating tablet Quantity: 30 tablet Refills: 0  Commonly known as: ZOFRAN -ODT Take 1 tablet (4 mg total) by mouth every 8 (eight) hours as needed for up to 30 days Last time this was given: Ask your nurse  or doctor   REFRESH OPHTH Refills: 0  Place 1 drop into both eyes as needed       STOP taking these medications    atorvastatin  40 MG tablet Commonly known as: LIPITOR   metFORMIN  1000 MG tablet Commonly known as: GLUCOPHAGE    multivitamin tablet   olmesartan  40 MG tablet Commonly known as: BENICAR    pantoprazole  40 mg Grps   sucralfate  100 mg/mL suspension Commonly known as: CARAFATE          Brief History of Present Illness:  Per HPI Samuel Hubbard is a 69 y.o. male with a pertinent h/o HTN, hypothyroidism, esophageal cancer presents to [facility] ED for c/f g tube malfunction.  Pt and family felt his feeding tube was possibly blocked or clogged.  He has had ongoing nausea and vomiting daily for 2 months and complains of a lot of mucus more recently.  He has an allergy to legumes and feels that he has been reacting to his feed formulas.  His nutritionist has sourced a legume free formula but it has not been delivered yet.  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 Lavina Dry Diagnosis Date: 12/2022 Metastasis (if applicable): distal  esophagus extending into gastric cardia Number of prior lines of therapy (if applicable): completed 8 cycles of FLOT followed by carbo/paclitaxel  Last Treatment Date: competed chemo RT with carboplatin  and paclitaxel  12/07/23 Surgical History: none, per patient plans to follow up with Dr. [name] (thoracic) Radiation History: completed 12/07/23 -make an appointment to follow up with Dr. Lavina in one week  #Dysphagia: -has been on TF at home and concerned that he is not tolerating due to legume allergy -TF switched to Peptamen 1.5 (contains soybean oil NOT protein and so far tolerating) goal for cycled feed 90 mg/hr x 16 hrs (pt reports he has never tolerated over 75/hr so they continue for longer duration), continue 30 ml q4 hrs FWF -stay as close to 90 degrees upright with feedings   #GERD/Dyspepsia: -CT a/p showing obstructive distal esophageal mass with upstream esophageal distention/fluid retention, no peritoneal disease seen on imaging and prior omental biopsy negative  -increase omeprazole  to 40 BID with scheduled pepcid  20 mg BID -start claritin for secretions  #Nausea/vomiting: -start nightly olanzapine -continue prn ODT zofran   #Cancer Related Pain: -continue 10 ml q4 prn liquid oxy  #Aspiration PNA; -zosyn--> Augmentin for 5 total days -back on RA   #AMS: -possibly related to hypotension vs aspiration? -CT head WNL, received ketamine in the ED -back to baseline   #constipation: -continue Senna BID, added Miralax  daily   #FEN - Electrolytes monitored daily and supplemented as needed   Chronic or Inactive Problems   #Hypothyroid: -synthroid     #A Fib: -continue amiodarone , eliquis    #Insomnia #Mood disorder: -continue home BID prn ativan  -continue home remeron , celexa   Malnutrition: He has been diagnosed with mild protein-calorie malnutrition in the context of chronic illness, based on >10% unintentional weight loss in 6 months.  - Recommend enteral  nutrition support, Recommend vitamin/mineral supplementation, Recommend trend weight status.   Social Drivers of Health with Concerns   Physical Activity: Insufficiently Active (01/09/2024)   Received from [facility]   Exercise Vital Sign    On average, how many days per week do you engage in moderate to strenuous exercise (like a brisk walk)?: 1 day    On average, how many minutes do you engage in exercise at  this level?: 10 min  Stress: Stress Concern Present (01/09/2024)   Received from [facility] Occupational [facility] - Occupational Stress Questionnaire    Do you feel stress - tense, restless, nervous, or anxious, or unable to sleep at night because your mind is troubled all the time - these days?: To some extent  Social Connections: Moderately Isolated (02/01/2024)   Received from [facility]   Social Connection and Isolation Panel    In a typical week, how many times do you talk on the phone with family, friends, or neighbors?: Twice a week    How often do you get together with friends or relatives?: Once a week    How often do you attend church or religious services?: Never    Do you belong to any clubs or organizations such as church groups, unions, fraternal or athletic groups, or school groups?: No    How often do you attend meetings of the clubs or organizations you belong to?: Never    Are you married, widowed, divorced, separated, never married, or living with a partner?: Married    Surgeries and Procedures Performed:  none  _____________________  Discharge Exam:  BP 125/83 (BP Location: Right upper arm, Patient Position: Lying)   Pulse 63   Temp 36.8 C (98.2 F) (Oral)   Resp 18   Ht 182 cm (5' 11.65)   Wt 87.2 kg (192 lb 3.9 oz)   SpO2 98%   BMI 26.33 kg/m  O2 Device: Nasal cannula (03/01/24 1319) Physical Exam: Gen: NAD Eyes: EOMI, PERRLA Throat: Supple, no JVD, no LAD Resp: CTAB, no crackles or wheezes CV: RRR, no  murmurs, 2+ peripheral pulses GI: Soft, non-tender, non-distended, +BS, G tube in place GU: no foley MSK: strength 5/5 in all extremities Neuro: Grossly normal, CN intact, sensory intact Psych: Affect- normal SKIN: warm, dry EXT: no edema   Pertinent Lab Testing: Recent Labs  Lab 02/29/24 2356 03/01/24 0316 03/02/24 0414  NA 137 138 140  K 2.9* 3.6 3.2*  CL 106 103 101  CO2 22 28 30   BUN 13 14 9   CREATININE 0.7 0.7 0.7  GLUCOSE 99 108 119  CALCIUM  7.2* 8.5* 8.7   Recent Labs  Lab 02/29/24 2356 03/01/24 0316 03/02/24 0414  AST 19 13* 15  ALT 13* 14* 13*  ALKPHOS 49 50 54  TBILI 1.0 0.7 0.3*    Recent Labs  Lab 02/29/24 1934 03/01/24 0316 03/02/24 0414  WBC 7.2 6.6 4.2  HGB 12.2* 10.3* 10.7*  HCT 39.3 33.5* 34.7*  PLT 241 217 220   Recent Labs  Lab 02/29/24 1934  APTT 35.9  INR 1.4*        Pertinent Imaging:   CT chest PE protocol incl CT angiogram chest w wo contrast Result Date: 03/01/2024 CT CHEST PE  PROTOCOL INCL CT CHEST ANGIOGRAM W WO CONTRAST INDICATION: afib, altered mental status, I48.0 Paroxysmal atrial fibrillation (CMS/HHS-HCC), R41.82 Altered mental status, unspecified COMPARISON EXAMS:  CT chest 09/29/2023 Protocol:  Chest CTA PE Protocol.  Contiguous 1.25 mm axial images were obtained from the neck base through the upper abdomen following intravenous administration of iodinated contrast material.  If IV contrast material had not been administered, the likelihood of detecting abnormalities relevant to the patient's condition would have been substantially decreased.  3-D reconstructions were likewise performed and indicated to increase the sensitivity of detecting clinically relevant pathology. FINDINGS: PULMONARY ARTERIES: Adequate contrast opacification of the pulmonary arteries without evidence of  pulmonary embolism. The main pulmonary artery is not dilated. LUNGS/AIRWAYS: The central airways are patent. No suspicious pulmonary opacities or  nodules. Centrilobular groundglass and consolidative opacities in the right middle and bilateral lower lobes. PLEURA: No pleural effusion. No pneumothorax. MEDIASTINUM: Increased left lower paratracheal lymphadenopathy. Similar prominent bilateral hilar lymph nodes. HEART/VESSELS: Trace pericardial effusion. Moderate coronary artery calcifications. Normal caliber of the thoracic aorta. Increased diffuse distal esophageal wall thickening with associated severe luminal narrowing/stricture. Proximal esophagus is patulous with layering fluid/debris. There is increased mural nodularity of the upper esophagus with a reference 2.6 x 1.4 cm mural nodule extending into the mediastinum and abutting the trachea (series 9, image 56). Right chest wall port catheter terminates at the superior cavoatrial junction. VISIBLE ABDOMEN: Please see separate report for findings below the diaphragm. BONES/SOFT TISSUES/OTHER: The visualized thyroid  is unremarkable. No axillary lymphadenopathy. No aggressive-appearing osseous lesions. IMPRESSION: 1.  No evidence of acute pulmonary embolism. 2.  Increased diffuse thickening of the distal esophagus with associated severe luminal narrowing/stricture. The proximal esophagus is patulous with layering fluid/debris, increasing the risk for aspiration. Areas of increasing mural nodularity in the proximal esophagus, consistent with additional sites of disease. 3.  Basilar predominant centrilobular groundglass and consolidative opacities, favored to represent aspiration. 4.  Increased left lower paratracheal lymphadenopathy, which may represent nodal metastatic disease. Findings were discussed by telephone with  AMELIA ROSE BALLESTEROS-EDWARDS by Kristie Sabot, MD at 02/29/2024 9:02 PM. The preliminary report (critical or emergent communication) was reviewed prior to this dictation and there are no critical differences between the preliminary results and the impressions in this final report.  Electronically Reviewed by:  Larri Sabot, MD, [facility] Electronically Reviewed on:  03/01/2024 9:39 AM I have reviewed the images and concur with the above findings. Electronically Signed by:  Silvano Borer, MD, [facility] Electronically Signed on:  03/01/2024 4:42 PM  CT abdomen pelvis with contrast Result Date: 03/01/2024 CT abdomen and pelvis with IV contrast Comparison:  09/29/2023. Indication:  Abdominal pain, acute, nonlocalized, R11.0 Nausea. Technique:  CT imaging was performed of the abdomen and pelvis following the administration of intravenous contrast.  Iodinated contrast was used due to the indications for the examination, to improve disease detection and further define anatomy. Coronal and sagittal reformatted images were generated and reviewed. Findings: - Lower Thorax: Patulous distal esophagus with some diffuse soft tissue thickening of the distal esophagus in keeping with esophageal cancer. No suspicious pulmonary abnormalities. No pleural or pericardial effusions. Severe coronary calcifications. - Liver: Normal in morphology and enhancement.  No suspicious hepatic masses are identified.  The portal and hepatic veins are patent. - Biliary and Gallbladder: No intrahepatic or extrahepatic bile duct dilatation. Gallbladder is unremarkable. - Spleen: Normal in appearance.  - Pancreas: Normal in appearance. - Adrenal Glands: Normal in appearance. - Kidneys: Kidneys are symmetric in size and enhancement. No suspicious renal lesions. No hydronephrosis. - Abdominal and Pelvic Vasculature: No abdominal aortic aneurysm. - Gastrointestinal Tract: . No bowel obstruction. Jejunostomy tube is in place. Cecal diverticulum noted - Peritoneum/Mesentery/Retroperitoneum: No free fluid.  No free intraperitoneal air. - Lymph Nodes: No retroperitoneal, mesenteric, pelvic, or inguinal lymphadenopathy.  - Bladder: Normal in appearance. - Pelvic Organs: Dystrophic calcifications in the prostate.  Dystrophic calcification in the prostate. - Body Wall: Changes of ventral hernia repair. Persistent periumbilical hernia with mesh repair containing a small portion of nonobstructed small bowel. - Musculoskeletal:  No aggressive appearing osseous lesions. Mild multilevel degenerative disc disease. Moderate bilateral hip osteophyte is. Impression:  1.  No acute finding in the abdomen or pelvis. 2.  Redemonstrated obstructive distal esophageal mass with upstream esophageal distention containing fluid placing patient at risk for aspiration. See same-day CT chest. 3.  Jejunostomy tube in place. No bowel obstruction. 4.  No evidence of metastatic disease in abdomen or pelvis. The preliminary report (critical or emergent communication) was reviewed prior to this dictation and there are no critical differences between the preliminary results and the impressions in this final report. Electronically Reviewed by:  Jerrell Senior, MD, [facility] Electronically Reviewed on:  03/01/2024 8:10 AM I have reviewed the images and concur with the above findings. Electronically Signed by:  Redell Dawn, MD, [facility] Electronically Signed on:  03/01/2024 9:00 AM  CT head and neck angiogram stroke code with contrast Result Date: 02/29/2024 CTA HEAD AND NECK WITH CONTRAST INDICATION: Neuro deficit, acute, stroke suspected, stroke code, R41.82 Altered mental status, unspecified COMPARISONS: CT neck 09/08/2023, 09/29/2023 CT abdomen pelvis. TECHNIQUE/PROTOCOL: Contrast enhanced head and neck CT arteriogram was performed. 3D reconstructions were performed to evaluate vascular anatomy. All internal carotid measurements were made according to the NASCET criteria. Specifically, if stenosis of the internal carotid artery is reported, the stenosis was calculated as follows: ( 1 - (minimal residual diameter/normal diameter of distal ICA)). BRAIN FINDINGS: See contemporaneous CT brain for detailed report on intracranial parenchymal  findings. VASCULATURE FINDINGS: Aortic Arch: No aneurysmal dilation or dissection. Four-vessel arch with the left vertebral artery arising directly from the aorta. Subclavian Arteries: No significant stenosis. Common Carotid Arteries: No significant stenosis. Cervical ICAs:    - Right:  No significant stenosis.    - Left: No significant stenosis. Vertebral Arteries: No significant stenosis. Intracranial ICAs: No significant stenosis. MCAs: No significant stenosis. ACAs:  No significant stenosis. Nonopacified appearance of the left transverse sinus may reflect early phase of contrast timing. Basilar Artery: No significant stenosis. PCAs: No significant stenosis. Posterior Comm Arteries: No significant stenosis. SCAs: Patent. AICAs: Patent. PICAs: Patent. NECK FINDINGS: Lymph Nodes: No enlarged or abnormal appearing lymph nodes. Suprahyoid Neck: Normal. Infrahyoid Neck: Patulous esophagus with nodular soft tissue at the anterior margin of the esophagus at the left level of the sternal notch which appears to have increased since the 09/29/2023 CT abdomen pelvis. Chronic traumatic deformity of thyroid  cartilage. Salivary Glands: Normal. Thyroid : Normal. Visualized Thorax: Scattered pulmonary nodular and groundglass opacities which may represent aspiration. Cervical Spine: No high-grade spinal canal stenosis. IMPRESSION: 1.  See contemporaneous CT brain for detailed report on intracranial parenchymal findings. 2.  No intracranial large vessel occlusion. 3.  No hemodynamically significant arterial stenosis in the neck. Electronically Reviewed by:  Frederic Dines, MD, [facility] Electronically Reviewed on:  02/29/2024 10:03 PM I have reviewed the images and concur with the above findings. Electronically Signed by:  Ozell Lansing, MD, [facility] Electronically Signed on:  02/29/2024 10:22 PM  CT brain stroke code without contrast protocol Result Date: 02/29/2024 CT BRAIN WITHOUT CONTRAST INDICATION: stroke code,  R41.82 Altered mental status, unspecified COMPARISON: None. TECHNIQUE: Standard noncontrast brain CT. FINDINGS: Brain Parenchyma: There is no hemorrhage, cerebral edema, acute cortical infarction, mass, mass effect, or midline shift. Left greater than right periventricular white matter hypodensities, likely sequela of chronic small vessel ischemic disease. Ventricles and Sulci: Normal for age.  Extra-Axial Spaces: No extra-axial fluid collection. Basal Cisterns: Normal. Paranasal Sinuses: Mucous retention cysts in the right maxillary sinus. Mastoids: Normal. Orbits: Normal. Cranium and Bones: Normal. Soft Tissues: Normal. IMPRESSION: No acute intracranial abnormalities. Findings  were discussed by telephone with Dr. Genella by Frederic Dines, MD at 02/29/2024 6:50 PM. Electronically Reviewed by:  Frederic Dines, MD, [facility] Electronically Reviewed on:  02/29/2024 7:41 PM I have reviewed the images and concur with the above findings. Electronically Signed by:  Ozell Lansing, MD, [facility] Electronically Signed on:  02/29/2024 9:16 PM  US  POC cardiac images Result Date: 02/29/2024 Study Date and Time: 2024-02-29 17:51 Study Author: Elouise Popp Cardiac - ED: Indication(s) for Exam:     Focused cardiac ultrasound was performed for the following indication(s):: Hypotension - I95.9, Tachycardia - R00.0     Other indication(s): N/A Views Obtained:     The following views were obtained:: Parasternal long axis, Parasternal short axis, Apical 4 chamber, Subxiphoid, Inferior vena cava     Other: N/A Findings:     Pericardial Effusion: Absent     If effusion present, evidence of cardiac tamponade?: N/A     Left Ventricular Function: Indeterminate     Right Ventricular Size: Normal     If dilated RV, other signs of RV strain?: N/A     Inferior Vena Cava: Indeterminate/Image not obtained     IVC Max Diameter (cm): N/A     Other Findings: N/A Interpretation:     Focused cardiac ultrasound revealed:: Other, see additional  comments below     Additional comments;: LV function difficult to determine due to A-fib with RVR, no large effusion, no obvious RV strain CPT Code(s) That May Apply:     CPT code: 06691 Echocardiography, transthoracic, real time with image documentation (2D) includes M-mode recording when performed; follow up or limited: N/A Signed by Elouise Popp on 2024-02-29 19:49   _____________________  Code Status: Full Code Goals of care were not addressed during this admission.   Status on Discharge:  Current activity: Walks occasionally (03/01/24 2059) Current mobility: Slightly limited (03/01/24 2059)  Activity Recommendation: activity as tolerated  Other Discharge Instructions: Services setup at discharge: None Tubes/lines at discharge: None  Diet: DIET NPO Effective Now tube feeding (PEPTAMEN 1.5) liquid amino acids-protein hydrolys (PROSOURCE TF 20) 20 gram-80 kcal/60 mL 60 mL  Wound Care Order Instructions     None       _____________________  Time spent on discharge process: 35 minutes    HANA JEANMARIE CHARTER, MD [facility]  03/02/2024   Hospital Contact Information:  [facility]) [facility]) [employer] [facility])  Pending tests:  Laboratory: [phone] Microbiology: [phone] Pathology: [phone] Radiology: [phone]  General questions: [phone] Pending tests: Laboratory: [phone] Microbiology: [phone] Pathology: [phone] Radiology: [phone]  General questions:  [phone] Pending tests:  Laboratory: [phone] Microbiology: [phone] Pathology: [phone] Radiology: [phone]  General questions:  [phone] Pending tests: Laboratory: [phone]) [phone] Microbiology: [phone] Pathology: [phone] Radiology: [phone]  General questions:  [phone]    "

## 2024-03-02 NOTE — Progress Notes (Signed)
 [facility] [address] [place], [place] [address] Phone [phone] Fax [phone]  Detailed Written Order / Statement of Medical Necessity  General Information Insurance ID: [id number]   Patient name Samuel Hubbard Patient Address [address] [place] [address] Patient Home Phone [phone]  Estimated body mass index is 26.33 kg/m as calculated from the following:   Height as of 03/01/24: 182 cm (5' 11.65).   Weight as of 03/01/24: 87.2 kg (192 lb 3.9 oz). Allergies  Allergen Reactions   Legumes Swelling   Nutren 1.0 [Nutritional Supplements] Other (See Comments) and Cough    Difficulty swallowing. confusion   Peanut Swelling    Throat swelling and lips   Wasp Venom Swelling   Zolpidem Hallucination     Home Start Date: 03/02/24 Expected Duration of Therapy: >90 days Services: Enteral Nutrition  Diagnoses  Malignant neoplasm of lower third of esophagus  Prescription Information Formula: Peptamen 1.5 (A5846) Amount: 6 cartons/day (282mL/carton) Modular: Prosource TF20, 1 pack daily (A5844) Route: GJ Method of Administration: Feeding Pump (A0997) Supplies: Syringes for flushing, drain sponges, tape, cotton tipped applicators, extensions/adapters, pump, IV pole, backpack, feeding bags (A5964) Instructions for Administration: Via pump, provide Peptamen 1.5 through the J-port at a rate of 90mL/hr x 16 hours or 120mL/hr x 12 hours. Provide 1 packet of Prosource TF20 through J-port daily.  Flushing Protocol Flush with 30mL of water  every 4 waking hours with additional flushes as directed for hydration.  Physician/ Provider I certify that the use of meds/supplies for IV maint,external infusion pump (E0781/A4222/B9004/B9002),supplies not used w/pump,skilled nursing home infusion/specialty drug admin per visit (99601/99602/Q5001) are necessary & supervised by MD for initial & re-cert. KARLYNE JEANMARIE CHARTER, MD  [facility] [address] [place] [address] Dept: [phone] Dept Fax: [phone] Loc: [phone]

## 2024-03-02 NOTE — Care Plan (Signed)
" °  Pt hospital day 2 for J tube obstruction,Nausea and vomiting, acute confusion and aphasia . Pt A&Ox4, VSS, afebrile. Pt ?8-9 /10 pain located to abdomen. Given PRN Oxycodone  for pain management.Patient reports  persistent nausea and vomiting  with  clear mucousy secretions. Given IV Zofran  and Compazine .Patient is on npo.J tube is connected to Continuous TF of  Peptamin 1.5 at 30ml/hr.CHG bath done.MIVF.IV antibiotics given.Patient voids in the bathroom with adequate clear yellow urine. Patient is currently resting.Bed locked in lowest position.Call bell in reach.   Problem: All patients positive for delirium Goal: Patient will remain safe Outcome: Progressing   Problem: Disorientation Goal: The patient will be oriented to person, time, or place in the environment Outcome: Progressing   Problem: Safety Goal: Free from accidental physical injury Outcome: Progressing Goal: Free from abuse Outcome: Progressing   Problem: Pain Goal: Patient's pain/discomfort is manageable Description: Assess and monitor patient's pain using appropriate pain scale. Collaborate with interdisciplinary team and initiate plan and interventions as ordered. Re-assess patient's pain level 30 - 60 minutes after pain management intervention.  Outcome: Progressing   Problem: Daily Care Goal: Daily care needs are met Description: Assess and monitor ability to perform self care and identify potential discharge needs. Outcome: Progressing   "

## 2024-03-02 NOTE — Care Plan (Signed)
 DISCHARGE  Pt HD 3 for nausea and vomiting with a clogged GJ tube with intermittent confusion. Pt A&O x3, VSS, afebrile. Pt ? 1-7/10 pain located on his abdomen.Oxycodone  given for pain management. Pt had intermittent episodes of nausea and vomiting up mucus. PRN Zofran  given for nausea with mild improvement. GJ tube located to left lower abdomen. Nutren 1.5 at 45 mL/hr. Voids adequate, clear, yellow urine. LBM prior to admission. Ambulatory as tolerated.   Right chest port de accessed and PIVs discontinued. POC resolved. All acute problems resolved. Education complete (see education record). Discharge instructions, prescriptions, follow-up appointment, signs and symptoms to report reviewed with patient. Patient and family given the opportunity to ask questions; all questions answered to the patient's satisfaction. Patient and family verbalized understanding of discharge instructions.  Discharged via wheelchair accompanied by Hospital transport at [phone].   Problem: All patients positive for delirium Goal: Patient will remain safe Outcome: Met/ Completed   Problem: Disorientation Goal: The patient will be oriented to person, time, or place in the environment Outcome: Met/ Completed   Problem: Inappropriate Behavior Goal: The patient will have behavior appropriate to place and/or for the person e.g. pulling at tubes or dressings, attempting to get out of bed when that is contraindicated and the like Outcome: Met/ Completed   Problem: Inappropriate Communication Goal: The patient will have communication appropriate to place and/or for the person e.g. incoherence, non-communicative, nonsensical, or unintelligible speech Outcome: Met/ Completed   Problem: Illusions/Hallucinations Goal: The patient will no longer see or hear things that are not there, distortion of visual objects Outcome: Met/ Completed   Problem: Psychomotor Delay Goal: The patient will no longer have delayed responsiveness,  few or spontaneous actions/word e.g. when patient is prodded, reaction is deferred and/or the patient is unarousable Outcome: Met/ Completed   Problem: Safety Goal: Free from accidental physical injury Outcome: Met/ Completed Goal: Free from abuse Outcome: Met/ Completed   Problem: Daily Care Goal: Daily care needs are met Description: Assess and monitor ability to perform self care and identify potential discharge needs. Outcome: Met/ Completed   Problem: Pain Goal: Patient's pain/discomfort is manageable Description: Assess and monitor patient's pain using appropriate pain scale. Collaborate with interdisciplinary team and initiate plan and interventions as ordered. Re-assess patient's pain level 30 - 60 minutes after pain management intervention.  Outcome: Met/ Completed   Problem: Compromised Skin Integrity Goal: Skin integrity is maintained or improved Description: Assess and monitor skin integrity. Identify patients at risk for skin breakdown on admission and per policy. Collaborate with interdisciplinary team and initiate plans and interventions as needed. Outcome: Met/ Completed Goal: Fluid and electrolyte balance are achieved/maintained Description: Assess and monitor vital signs (orthostatic vitals if applicable), fluid intake and output, urine color, labs, skin turgor, mucous membranes, jugular venous distention, edema, circumference of edematous extremities and abdominal girth, respiratory status, and mental status.  Monitor for signs and symptoms of hypovolemia (tachycardia, rapid breathing, decreased urine output, postural hypotension, confusion, syncope).  Monitor for signs and symptoms of hypervolemia (strong rapid pulse, shortness of breath, difficulty breathing lying down, crackles heard in lung fields, edema). Collaborate with interdisciplinary team and initiate plan and interventions as ordered. Outcome: Met/ Completed Goal: Nutritional status is improving Description:  Monitor and assess patient for malnutrition (ex- brittle hair, bruises, dry skin, pale skin and conjunctiva, muscle wasting, smooth red tongue, and disorientation). Collaborate with interdisciplinary team and initiate plan and interventions as ordered.  Monitor patient's weight and dietary intake as ordered or per  policy. Utilize nutrition screening tool and intervene per policy. Determine patient's food preferences and provide high-protein, high-caloric foods as appropriate.  Outcome: Met/ Completed   Problem: Knowledge Deficit Goal: Patient/family/caregiver demonstrates understanding of disease process, treatment plan, medications, and discharge instructions Description: Complete learning assessment and assess knowledge base. Outcome: Met/ Completed   Problem: Discharge Barriers Goal: Patient's discharge needs are met Description: Collaborate with interdisciplinary team and initiate plans and interventions as needed.  Outcome: Met/ Completed   Problem: Clinical: Goal: Functional Outcome: Met/ Completed   Problem: Nutrition Deficit: Goal: Ability to achieve adequate nutritional intake will improve Outcome: Met/ Completed   Problem: Fall Risk Prevention for Adult Populations Goal: Moderate Risk Fall Prevention - Adults Description: The patient will remain free from falls Outcome: Met/ Completed

## 2024-03-03 ENCOUNTER — Telehealth: Payer: Self-pay

## 2024-03-03 ENCOUNTER — Encounter: Payer: Self-pay | Admitting: Gastroenterology

## 2024-03-03 ENCOUNTER — Encounter: Payer: Self-pay | Admitting: Internal Medicine

## 2024-03-03 ENCOUNTER — Ambulatory Visit: Admitting: Internal Medicine

## 2024-03-03 VITALS — BP 128/83 | HR 76 | Ht 72.0 in | Wt 193.0 lb

## 2024-03-03 DIAGNOSIS — C155 Malignant neoplasm of lower third of esophagus: Secondary | ICD-10-CM

## 2024-03-03 DIAGNOSIS — Z931 Gastrostomy status: Secondary | ICD-10-CM | POA: Diagnosis not present

## 2024-03-03 DIAGNOSIS — Z09 Encounter for follow-up examination after completed treatment for conditions other than malignant neoplasm: Secondary | ICD-10-CM

## 2024-03-03 DIAGNOSIS — K219 Gastro-esophageal reflux disease without esophagitis: Secondary | ICD-10-CM

## 2024-03-03 DIAGNOSIS — E039 Hypothyroidism, unspecified: Secondary | ICD-10-CM

## 2024-03-03 DIAGNOSIS — R1319 Other dysphagia: Secondary | ICD-10-CM

## 2024-03-03 DIAGNOSIS — F419 Anxiety disorder, unspecified: Secondary | ICD-10-CM | POA: Diagnosis not present

## 2024-03-03 DIAGNOSIS — R404 Transient alteration of awareness: Secondary | ICD-10-CM | POA: Diagnosis not present
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
## 2024-03-03 NOTE — Transitions of Care (Post Inpatient/ED Visit) (Signed)
 Today's TOC FU Call Status: Completed    Patient's Name and Date of Birth confirmed.    Transition Care Management Follow-up Telephone Call    Items Reviewed: Medications obtained,verified, and reconciled?: No (Patient was not feeling up to all the questions and medication review at this time. Stated he understood his discharge medications and changes as listed in DC instructions, did not have any questions. Made PCP appointment for today at 3 pm.)  Medications Reviewed Today: Patient declined full review, stated understood medications on discharge instructions.  Medications Reviewed Today   Medications were not reviewed in this encounter    Allergies reviewed and updated to reflect noted and patient confirmed new allergies prior tube feeding/legumes.   Home Care and Equipment/Supplies: Has Agency set up a time to come to your home?:  (Received tube feeding supplies at hospital and via [facility]. Has feeding pump at home already.)  Functional Questionnaire: No issues with ADL's other than eating due to tube feedings via GJ tube located in Left lower abdomen due to diagnosis of esophageal cancer.  (EPIC is not capturing all data).     Follow up appointments reviewed: PCP Follow-up appointment confirmed?: Yes Date of PCP follow-up appointment?: 03/03/24 Follow-up Provider: Suzzane Blanch at [facility] (Pt. made appointment after this call and RN CM called to confirm with patient.) Specialist Hospital Follow-up appointment confirmed?: Yes Date of Specialist follow-up appointment?: 03/04/24 Follow-Up Specialty Provider:: Almarie Crate Cardiologist Do you need transportation to your follow-up appointment?: No Do you understand care options if your condition(s) worsen?: Yes-patient verbalized understanding  SDOH Interventions Today    Flowsheet Row Most Recent Value  SDOH Interventions   Food Insecurity Interventions Intervention Not Indicated  Housing  Interventions Intervention Not Indicated  Transportation Interventions Intervention Not Indicated  Utilities Interventions Intervention Not Indicated   03/03/2024: Successful outreach with patient on brief but informative call.  Declined full medication review stating he understood medications on discharge, discharge instructions, what to call providers for.  Made PCP HFU for today at 3pm and has transportation.  Received needed tube feeding supplies from hospital and ongoing via [facility], patient and spouse independent in tube feedings.  Patient did not have questions or concerns relating to discharge, declined follow up call but appreciated outreach.    Samuel Edison MSN, RN RN [employer]  [facility] Office Hours M-F [phone] Direct Dial: [phone] Main Phone [phone]  Fax: [phone] [email or web address]

## 2024-03-03 NOTE — Patient Instructions (Addendum)
 Please continue Omeprazole  solution and Pepcid  for acid reflux.  Please take Sucralfate  as needed for persistent acid reflux.  Please continue taking Metformin  and Atorvastatin  regularly.

## 2024-03-04 ENCOUNTER — Ambulatory Visit: Attending: Nurse Practitioner | Admitting: Nurse Practitioner

## 2024-03-04 ENCOUNTER — Telehealth: Payer: Self-pay | Admitting: Dietician

## 2024-03-04 ENCOUNTER — Ambulatory Visit

## 2024-03-04 ENCOUNTER — Other Ambulatory Visit: Payer: Self-pay | Admitting: Nurse Practitioner

## 2024-03-04 ENCOUNTER — Encounter: Payer: Self-pay | Admitting: Nurse Practitioner

## 2024-03-04 ENCOUNTER — Telehealth: Payer: Self-pay | Admitting: Nurse Practitioner

## 2024-03-04 VITALS — BP 130/74 | HR 74 | Ht 72.0 in | Wt 191.8 lb

## 2024-03-04 DIAGNOSIS — I4891 Unspecified atrial fibrillation: Secondary | ICD-10-CM | POA: Diagnosis present

## 2024-03-04 DIAGNOSIS — I10 Essential (primary) hypertension: Secondary | ICD-10-CM | POA: Insufficient documentation

## 2024-03-04 DIAGNOSIS — I480 Paroxysmal atrial fibrillation: Secondary | ICD-10-CM

## 2024-03-04 DIAGNOSIS — E785 Hyperlipidemia, unspecified: Secondary | ICD-10-CM | POA: Insufficient documentation

## 2024-03-04 DIAGNOSIS — E876 Hypokalemia: Secondary | ICD-10-CM | POA: Diagnosis present

## 2024-03-04 DIAGNOSIS — G4733 Obstructive sleep apnea (adult) (pediatric): Secondary | ICD-10-CM | POA: Insufficient documentation

## 2024-03-04 DIAGNOSIS — R404 Transient alteration of awareness: Secondary | ICD-10-CM | POA: Insufficient documentation

## 2024-03-04 DIAGNOSIS — E782 Mixed hyperlipidemia: Secondary | ICD-10-CM

## 2024-03-04 NOTE — Telephone Encounter (Signed)
 Checking percert on the following   2 week Monitor XT Dx: Afib

## 2024-03-04 NOTE — Assessment & Plan Note (Addendum)
 Was evaluated for stroke during recent hospitalization Appears to be panic episode/severe anxiety Was evaluated by neurology in the hospital Would continue Eliquis  and statin for now Mental status at baseline currently

## 2024-03-04 NOTE — Patient Instructions (Addendum)
 Medication Instructions:  Your physician recommends that you continue on your current medications as directed. Please refer to the Current Medication list given to you today.   Labwork: BMET, Magnesium  and Fasting Lipid Panel in 2 weeks at [facility]  Testing/Procedures: Your physician has recommended that you wear a Zio monitor.   This monitor is a medical device that records the hearts electrical activity. Doctors most often use these monitors to diagnose arrhythmias. Arrhythmias are problems with the speed or rhythm of the heartbeat. The monitor is a small device applied to your chest. You can wear one while you do your normal daily activities. While wearing this monitor if you have any symptoms to push the button and record what you felt. Once you have worn this monitor for the period of time provider prescribed (for 14 days), you will return the monitor device in the postage paid box. Once it is returned they will download the data collected and provide us  with a report which the provider will then review and we will call you with those results. Important tips:  Avoid showering during the first 24 hours of wearing the monitor. Avoid excessive sweating to help maximize wear time. Do not submerge the device, no hot tubs, and no swimming pools. Keep any lotions or oils away from the patch. After 24 hours you may shower with the patch on. Take brief showers with your back facing the shower head.  Do not remove patch once it has been placed because that will interrupt data and decrease adhesive wear time. Push the button when you have any symptoms and write down what you were feeling. Once you have completed wearing your monitor, remove and place into box which has postage paid and place in your outgoing mailbox.  If for some reason you have misplaced your box then call our office and we can provide another box and/or mail it off for you.   Follow-Up: Your physician recommends that you  schedule a follow-up appointment in: 6-8 weeks  Any Other Special Instructions Will Be Listed Below (If Applicable). Thank you for choosing [facility]!     If you need a refill on your cardiac medications before your next appointment, please call your pharmacy.

## 2024-03-04 NOTE — Assessment & Plan Note (Signed)
 Due to dysphagia from esophageal carcinoma F/u with nutritionist for tube feeding counseling

## 2024-03-04 NOTE — Telephone Encounter (Signed)
 Called patient to assess tube feed tolerance. Discharged from [facility] 1/14 on Peptamen @ 60 ml/hr. Spoke with wife who reports doing okay with formula so far. Planning to gradually increase up to 75 ml/hr. Hoping to get up to goal over the weekend. RD to call on Monday for brief follow-up. Wife appreciative.

## 2024-03-04 NOTE — Progress Notes (Unsigned)
 " Cardiology Office Note   Date: 03/04/2024 ID:  Samuel Hubbard, DOB Nov 27, 1955, MRN [id number] PCP: Tobie Suzzane POUR, MD  [facility] Providers Cardiologist:  [name], MD     History of Present Illness Samuel Hubbard is a 69 y.o. male with a PMH of A-fib, HTN, OSA on CPAP, history of pheochromocytoma, hyperlipidemia, esophageal cancer (follows Oncology and has completed sessions of radiation as well as chemotherapy), who presents today for hospital follow-up. Receives home tube feeds medications through PEG tube and is NPO.   Last seen by Dr. Mallipeddi on 02/03/2023. Was overall doing well at the time.   Hospitalized in December 2025 at [facility] d/t A-fib with RVR and was found to be hypotensive and symptomatic.  He also developed acute onset AMS, elevated lactate, toxic screen negative. Became acutely agitated in CT scanner. Received IV amiodarone  that was transition to oral amiodarone .  Was given IV iron  due to worsening anemia.  CT brain w/o contrast showed no acute intracranial abnormalities. CTA of head was unremarkable. CT of chest concerning for possible BL lower lobe aspiration or infection. Was given ABX. Was recommended to follow-up with outpatient cardiology.   Today he is here for follow-up.  He is here with his wife and states he is doing better. Wants to know why [facility] stopped atorvastatin , olmesartan , and sucralfate . Tolerating medications well. Scheduled for future PET scan with [facility]. Denies any chest pain, shortness of breath, palpitations, syncope, presyncope, dizziness, orthopnea, PND, swelling or significant weight changes, acute bleeding, or claudication. Will be getting back to returning to his CPAP, does admit to phlegm.   ROS: Negative. See HPI.   Studies Reviewed  EKG: EKG Interpretation Date/Time:  Friday March 04 2024 10:15:36 EST Ventricular Rate:  74 PR Interval:  186 QRS Duration:  84 QT Interval:  412 QTC Calculation: 457 R  Axis:   14  Text Interpretation: Normal sinus rhythm Possible Inferior infarct , age undetermined When compared with ECG of 01-Feb-2024 15:32, PREVIOUS ECG IS PRESENT Confirmed by Miriam Norris [phone]) on 03/08/2024 11:00:15 AM   Echo 01/2024:   1. Left ventricular ejection fraction, by estimation, is 60 to 65%. The  left ventricle has normal function. The left ventricle has no regional  wall motion abnormalities. There is moderate left ventricular hypertrophy.  Left ventricular diastolic  parameters were normal.   2. Right ventricular systolic function is normal. The right ventricular  size is normal. Tricuspid regurgitation signal is inadequate for assessing  PA pressure.   3. The mitral valve is normal in structure. Trivial mitral valve  regurgitation. No evidence of mitral stenosis.   4. The aortic valve is tricuspid. There is mild calcification of the  aortic valve. There is mild thickening of the aortic valve. Aortic valve  regurgitation is mild. No aortic stenosis is present.   5. Aortic dilatation noted. There is mild dilatation of the aortic root,  measuring 40 mm.   6. The inferior vena cava is dilated in size with >50% respiratory  variability, suggesting right atrial pressure of 8 mmHg.    Risk Assessment/Calculations  The 10-year ASCVD risk score (Arnett DK, et al., 2019) is: 24.9%   Values used to calculate the score:     Age: 89 years     Clinically relevant sex: Male     Is Non-Hispanic African American: No     Diabetic: Yes     Tobacco smoker: No     Systolic Blood Pressure: 130 mmHg  Is BP treated: Yes     HDL Cholesterol: 52 mg/dL     Total Cholesterol: 139 mg/dL   Physical Exam VS:  BP 130/74 (BP Location: Left Arm)   Pulse 74   Ht 6' (1.829 m)   Wt 191 lb 12.8 oz (87 kg)   SpO2 100%   BMI 26.01 kg/m        Wt Readings from Last 3 Encounters:  03/04/24 191 lb 12.8 oz (87 kg)  03/03/24 193 lb (87.5 kg)  02/09/24 199 lb (90.3 kg)    GEN: Well  nourished, well developed in no acute distress NECK: No JVD; No carotid bruits CARDIAC: S1/S2, RRR, no murmurs, rubs, gallops RESPIRATORY:  Clear to auscultation without rales, wheezing or rhonchi  ABDOMEN: Soft, non-tender, non-distended EXTREMITIES:  No edema; No deformity   ASSESSMENT AND PLAN  A-fib Denies any tachycardia or palpitations. Possibly caused by ? Aspiration PNA. EKG shows SR today. Will arrange BMET, Mag in 1-2 weeks. Will also arrange 2 week monitor to evaluate his A-fib. He is scheduled for upcoming PET scan soon. Discussed avoiding triggers to A-fib. Heart healthy diet and regular cardiovascular exercise encouraged. Continue current medication regimen and did discuss monitoring d/t Amiodarone . Most recent TSH/LFT's were normal. Continue Eliquis  for stroke prevention.   HTN BP stable. Discussed to monitor BP at home at least 2 hours after medications and sitting for 5-10 minutes. No longer on olmesartan  d/t soft BP's in the past. No medication changes at this time. Will obtain labs as noted above. Heart healthy diet and regular cardiovascular exercise encouraged.   HLD No longer on atorvastatin  and unsure why. Will obtain FLP and is due for this to be checked. Heart healthy diet and regular cardiovascular exercise encouraged.   OSA on CPAP   Encouraged/discussed compliance with CPAP machine. Continue to follow with PCP.  5. Hypokalemia Will begin liquid potassium as his most recent potassium level was 3.2. See chart. This has been addressed. Will recheck BMET as noted above.    Dispo: Follow-up with MD/APP in 6-8 weeks or sooner if anything changes.   Signed, Almarie Crate, NP   "

## 2024-03-04 NOTE — Assessment & Plan Note (Signed)
 Lab Results  Component Value Date   TSH 0.163 (L) 01/12/2024   Reduced dose of levothyroxine  to 200 mcg QD after last TSH check Will recheck TSH and free T4 after 3-4 weeks - on Amiodarone  now for A Fib, which can also affect thyroid  function tests

## 2024-03-04 NOTE — Assessment & Plan Note (Signed)
 Uncontrolled Recently started omeprazole  solution 40 mg BID Continue Pepcid  solution 20 mg BID Has sucralfate  as needed

## 2024-03-04 NOTE — Assessment & Plan Note (Signed)
 Hospital chart reviewed, including discharge summary Medications reconciled and reviewed with the patient in detail  Advised to continue taking metformin  and atorvastatin  (which were ?discontinued from hospital)

## 2024-03-04 NOTE — Assessment & Plan Note (Signed)
 He has chronic dysphagia due to esophageal carcinoma, s/p chemoradiation He has PEG tube in place, dependent on PEG feeding currently - sees nutritionist

## 2024-03-04 NOTE — Assessment & Plan Note (Signed)
 Overall well-controlled currently Continue mirtazapine  15 mg nightly Continue Ativan  0.5 mg twice daily as needed for anxiety

## 2024-03-04 NOTE — Assessment & Plan Note (Signed)
 Followed by oncology and radiation oncology Had an attempted esophagectomy at [facility], but aborted due to evidence of bulky tumor at the GEJ, splenic and omental metastasis and metastasis localized to gastric fundus and body limiting width of potential conduit  Has had chemoradiation

## 2024-03-07 ENCOUNTER — Other Ambulatory Visit: Payer: Self-pay

## 2024-03-07 DIAGNOSIS — R1319 Other dysphagia: Secondary | ICD-10-CM

## 2024-03-07 DIAGNOSIS — C155 Malignant neoplasm of lower third of esophagus: Secondary | ICD-10-CM

## 2024-03-07 DIAGNOSIS — K219 Gastro-esophageal reflux disease without esophagitis: Secondary | ICD-10-CM

## 2024-03-07 MED ORDER — OMEPRAZOLE 2 MG/ML ORAL SUSPENSION: 40.0000 mg | Freq: Two times a day (BID) | ORAL | 0 refills | Status: AC

## 2024-03-07 MED ORDER — APIXABAN 5 MG PO TABS: 5.0000 mg | ORAL_TABLET | Freq: Two times a day (BID) | ORAL | 0 refills | Status: AC

## 2024-03-07 MED ORDER — FAMOTIDINE 40 MG/5ML PO SUSR: 20.0000 mg | Freq: Two times a day (BID) | ORAL | 2 refills | Status: AC

## 2024-03-07 MED ORDER — OLANZAPINE 5 MG PO TABS: 5.0000 mg | ORAL_TABLET | Freq: Every day | ORAL | 1 refills | Status: AC

## 2024-03-07 NOTE — Telephone Encounter (Signed)
 Refill request for Zyprexa , Pepcid , Eliquis , and Omeprazole  per [facility].  Reviewed with provider and ok to refill.

## 2024-03-07 NOTE — Addendum Note (Signed)
 Addended byBETHA TOBIE DOWNS on: 03/07/2024 07:03 PM   Modules accepted: Level of Service

## 2024-03-08 ENCOUNTER — Encounter: Payer: Self-pay | Admitting: *Deleted

## 2024-03-08 MED ORDER — POTASSIUM CHLORIDE 40 MEQ/15ML (20%) PO SOLN: 15.0000 mL | Freq: Every day | ORAL | 1 refills | Status: AC

## 2024-03-08 NOTE — Progress Notes (Signed)
 Patient's wife called and left a VM stating that the end of his feeding tube has broken and the cap will no longer close.  Before calling patient I spoke with nutritionist and she advised that patient will need to have tube replaced.    I called and spoke with wife and she states that he has rigged up the tube to make it work but they need a solution.  Syd got on the phone and states that he is feeling better, he feels like he wasn't getting enough fluids.  He is now having BMs.  He said that he can't do continuous feedings through the day but does do it at night. He said he does give himself about 4 glasses of free fluid per day with meds and feedings.  He said he is urinating without issues.  This is all improvement from where he was last week.  I advised him that for his issues with feeding tube, he will need to go to [facility] to have them evaluate and replace. He states that he will call the surgeon that put it in and see what they advise him to do.  He is not happy with this decision but will follow through.    I have updated our nutritionist on the above and she is pleased with his progress.    I will follow up with patient in a few days to ensure compliance.

## 2024-03-11 ENCOUNTER — Other Ambulatory Visit (HOSPITAL_COMMUNITY): Payer: Self-pay | Admitting: Occupational Therapy

## 2024-03-11 DIAGNOSIS — C155 Malignant neoplasm of lower third of esophagus: Secondary | ICD-10-CM

## 2024-03-11 DIAGNOSIS — R1312 Dysphagia, oropharyngeal phase: Secondary | ICD-10-CM

## 2024-03-14 ENCOUNTER — Inpatient Hospital Stay: Attending: Hematology | Admitting: Dietician

## 2024-03-14 ENCOUNTER — Telehealth: Payer: Self-pay | Admitting: Dietician

## 2024-03-14 ENCOUNTER — Other Ambulatory Visit: Payer: Self-pay | Admitting: Oncology

## 2024-03-14 DIAGNOSIS — G893 Neoplasm related pain (acute) (chronic): Secondary | ICD-10-CM

## 2024-03-14 NOTE — Telephone Encounter (Signed)
 Nutrition Follow-up:  Patient with malignant neoplasm of lower third esophagus. He has completed neoadjuvant FLOT q14d x 8 cycles. Esophagectomy at [facility] (8/12) aborted due to concerns for gastric tumor. S/p Jtube. Pathology negative for cancer. Patient completed concurrent chemoradiation with weekly carboplatin /paclitaxel  - final chemo 10/20, last RT 10/23. Patient is under the care of Dr. Davonna Sermon with patient and wife via telephone.  Feeling some better. Notes improved mood and moving around more. Had Jtube replaced ([facility] 1/21) due to broken cap. This was painful. Says feedings are going well. Currently doing 4 sometimes 5 cartons of Peptamen @ 90 ml/hr.   Spitting up a lot mucous still. Enough to fill up a cup. He is not eating or drinking orally. Sometimes patient will wet his mouth with tea and spit it out. He is starting outpatient PT on 2/2 and has SLP evaluation on 2/9.    Medications: reviewed   Labs: 1/14 outside labs reviewed   Anthropometrics: 191 lb 12.8 oz on 1/16  12/15 - 200 lb 9.9 oz   NUTRITION DIAGNOSIS: Food and nutrition related knowledge deficit improving    MALNUTRITION DIAGNOSIS: Moderate malnutrition continues    INTERVENTION:  Continue working to increase Peptamen 1.5 to goal of 6 cartons/day via pump. Pt agreeable to start feedings earlier Suggested mucinex for management of increased mucous Support and encouragement    MONITORING, EVALUATION, GOAL: wt trends, TF   NEXT VISIT: To be determined

## 2024-03-15 ENCOUNTER — Other Ambulatory Visit: Payer: Self-pay | Admitting: Oncology

## 2024-03-17 ENCOUNTER — Inpatient Hospital Stay

## 2024-03-17 ENCOUNTER — Encounter (HOSPITAL_COMMUNITY)
Admission: RE | Admit: 2024-03-17 | Discharge: 2024-03-17 | Disposition: A | Discharge status: Home / Self Care | Source: Ambulatory Visit | Attending: Oncology | Admitting: Oncology

## 2024-03-17 DIAGNOSIS — C155 Malignant neoplasm of lower third of esophagus: Secondary | ICD-10-CM

## 2024-03-17 DIAGNOSIS — E890 Postprocedural hypothyroidism: Secondary | ICD-10-CM | POA: Diagnosis not present

## 2024-03-17 DIAGNOSIS — Z79890 Hormone replacement therapy (postmenopausal): Secondary | ICD-10-CM | POA: Diagnosis not present

## 2024-03-17 DIAGNOSIS — E038 Other specified hypothyroidism: Secondary | ICD-10-CM

## 2024-03-17 DIAGNOSIS — E039 Hypothyroidism, unspecified: Secondary | ICD-10-CM

## 2024-03-17 LAB — CBC WITH DIFFERENTIAL/PLATELET
Abs Immature Granulocytes: 0.02 10*3/uL (ref 0.00–0.07)
Basophils Absolute: 0 10*3/uL (ref 0.0–0.1)
Basophils Relative: 1 %
Eosinophils Absolute: 0.1 10*3/uL (ref 0.0–0.5)
Eosinophils Relative: 1 %
HCT: 36.2 % — ABNORMAL LOW (ref 39.0–52.0)
Hemoglobin: 10.9 g/dL — ABNORMAL LOW (ref 13.0–17.0)
Immature Granulocytes: 0 %
Lymphocytes Relative: 5 %
Lymphs Abs: 0.3 10*3/uL — ABNORMAL LOW (ref 0.7–4.0)
MCH: 27.5 pg (ref 26.0–34.0)
MCHC: 30.1 g/dL (ref 30.0–36.0)
MCV: 91.4 fL (ref 80.0–100.0)
Monocytes Absolute: 0.5 10*3/uL (ref 0.1–1.0)
Monocytes Relative: 8 %
Neutro Abs: 5.5 10*3/uL (ref 1.7–7.7)
Neutrophils Relative %: 85 %
Platelets: 237 10*3/uL (ref 150–400)
RBC: 3.96 MIL/uL — ABNORMAL LOW (ref 4.22–5.81)
RDW: 15.1 % (ref 11.5–15.5)
WBC: 6.5 10*3/uL (ref 4.0–10.5)
nRBC: 0 % (ref 0.0–0.2)

## 2024-03-17 LAB — COMPREHENSIVE METABOLIC PANEL WITH GFR
ALT: 14 U/L (ref 0–44)
AST: 13 U/L — ABNORMAL LOW (ref 15–41)
Albumin: 3.7 g/dL (ref 3.5–5.0)
Alkaline Phosphatase: 70 U/L (ref 38–126)
Anion gap: 12 (ref 5–15)
BUN: 22 mg/dL (ref 8–23)
CO2: 26 mmol/L (ref 22–32)
Calcium: 9.5 mg/dL (ref 8.9–10.3)
Chloride: 100 mmol/L (ref 98–111)
Creatinine, Ser: 0.48 mg/dL — ABNORMAL LOW (ref 0.61–1.24)
GFR, Estimated: >60 mL/min
Glucose, Bld: 127 mg/dL — ABNORMAL HIGH (ref 70–99)
Potassium: 4.2 mmol/L (ref 3.5–5.1)
Sodium: 139 mmol/L (ref 135–145)
Total Bilirubin: <0.2 mg/dL (ref 0.0–1.2)
Total Protein: 6.7 g/dL (ref 6.5–8.1)

## 2024-03-17 LAB — T4, FREE: Free T4: 0.78 ng/dL — ABNORMAL LOW (ref 0.80–2.00)

## 2024-03-17 LAB — TSH: TSH: 19.8 u[IU]/mL — ABNORMAL HIGH (ref 0.350–4.500)

## 2024-03-17 LAB — IRON AND TIBC
Iron: 26 ug/dL — ABNORMAL LOW (ref 45–182)
Saturation Ratios: 9 % — ABNORMAL LOW (ref 17.9–39.5)
TIBC: 283 ug/dL (ref 250–450)
UIBC: 257 ug/dL

## 2024-03-17 LAB — VITAMIN B12: Vitamin B-12: 466 pg/mL (ref 180–914)

## 2024-03-17 LAB — FOLATE: Folate: 11.2 ng/mL

## 2024-03-17 LAB — FERRITIN: Ferritin: 350 ng/mL — ABNORMAL HIGH (ref 24–336)

## 2024-03-17 MED ORDER — FLUDEOXYGLUCOSE F - 18 (FDG) INJECTION
9.6300 | Freq: Once | INTRAVENOUS | Status: AC | PRN
  Administered 2024-03-17: 9.63 via INTRAVENOUS

## 2024-03-17 NOTE — Progress Notes (Signed)
 Patients port flushed without difficulty.  Good blood return noted with no bruising or swelling noted at site.  Band aid applied.  VSS with discharge and left in satisfactory condition with no s/s of distress noted.

## 2024-03-21 ENCOUNTER — Ambulatory Visit: Admitting: Physical Therapy

## 2024-03-22 ENCOUNTER — Ambulatory Visit (HOSPITAL_COMMUNITY)

## 2024-03-23 ENCOUNTER — Encounter: Payer: Self-pay | Admitting: Internal Medicine

## 2024-03-23 ENCOUNTER — Other Ambulatory Visit: Payer: Self-pay

## 2024-03-23 ENCOUNTER — Other Ambulatory Visit: Payer: Self-pay | Admitting: Internal Medicine

## 2024-03-23 ENCOUNTER — Inpatient Hospital Stay: Attending: Hematology | Admitting: Oncology

## 2024-03-23 DIAGNOSIS — E039 Hypothyroidism, unspecified: Secondary | ICD-10-CM

## 2024-03-23 MED ORDER — LEVOTHYROXINE SODIUM 50 MCG PO TABS: 50.0000 ug | ORAL_TABLET | Freq: Every day | ORAL | 1 refills | Status: AC

## 2024-03-23 NOTE — Patient Instructions (Signed)
 [facility] at [facility] Discharge Instructions   You were seen and examined today by Dr. Davonna.  She reviewed the results of your lab work which are normal/stable.   She reviewed the results of your PET scan which is showing the cancer has grown, but has not spread outside of the esophagus. We will refer you back to Dr. Medora for a consult for surgery. Dr. Davonna also recommends you see Dr. Wilhelmenia sooner to get an endoscopy and biopsy of the upper area of the esophagus.   Return as scheduled.    Thank you for choosing [facility] at [facility] to provide your oncology and hematology care.  To afford each patient quality time with our provider, please arrive at least 15 minutes before your scheduled appointment time.   If you have a lab appointment with the Cancer Center please come in thru the Main Entrance and check in at the main information desk.  You need to re-schedule your appointment should you arrive 10 or more minutes late.  We strive to give you quality time with our providers, and arriving late affects you and other patients whose appointments are after yours.  Also, if you no show three or more times for appointments you may be dismissed from the clinic at the providers discretion.     Again, thank you for choosing [facility].  Our hope is that these requests will decrease the amount of time that you wait before being seen by our physicians.       _____________________________________________________________  Should you have questions after your visit to [facility], please contact our office at [phone] and follow the prompts.  Our office hours are 8:00 a.m. and 4:30 p.m. Monday - Friday.  Please note that voicemails left after 4:00 p.m. may not be returned until the following business day.  We are closed weekends and major holidays.  You do have access to a nurse 24-7, just call the main  number to the clinic [phone] and do not press any options, hold on the line and a nurse will answer the phone.    For prescription refill requests, have your pharmacy contact our office and allow 72 hours.    Due to Covid, you will need to wear a mask upon entering the hospital. If you do not have a mask, a mask will be given to you at the Main Entrance upon arrival. For doctor visits, patients may have 1 support person age 72 or older with them. For treatment visits, patients can not have anyone with them due to social distancing guidelines and our immunocompromised population.

## 2024-03-23 NOTE — Progress Notes (Signed)
 The proposed treatment discussed in conference is for discussion purpose only and is not a binding recommendation.  The patients have not been physically examined, or presented with their treatment options.  Therefore, final treatment plans cannot be decided.

## 2024-03-23 NOTE — Progress Notes (Unsigned)
 " Patient Care Team: Tobie Suzzane POUR, MD as PCP - General (Internal Medicine) Mallipeddi, [name], MD as PCP - Cardiology (Cardiology) [name], MD as Medical Oncologist (Medical Oncology) [name], RN as Oncology Nurse Navigator (Medical Oncology) Dr [facility], OD (Optometry) [name], [name], MD as Consulting Physician (Gastroenterology)  Clinic Day:  03/23/24   Referring physician: Tobie Suzzane POUR, MD   CHIEF COMPLAINT:  CC: Stage III (T3 N0 M0 G3) poorly differentiated adenocarcinoma of distal esophagus   ASSESSMENT & PLAN:   Assessment & Plan: Samuel Hubbard  is a 69 y.o. male with stage III (T3 N0 M0 G3) poorly differentiated adenocarcinoma of distal esophagus   Assessment and Plan  Malignant neoplasm of the lower third of the esophagus  Stage III adenocarcinoma of distal esophagus S/p perioperative chemotherapy with FLOT X 8 cycles Had an attempted esophagectomy at [facility] but aborted due evidence of bulky tumor at [facility], splenic, and omental metastases and metastases localized to gastric fundus and body limiting width of potential conduit.  Omental nodule biopsies did not show any malignancy 11/02/2023: Started chemo RT with carboplatin  and paclitaxel   - Completed chemo RT with good response on CT scan. - The 8 mm paraesophageal node is very similar to how it was prior.  Recommended monitoring on the tumor board. - Labs reviewed today from 02/03/2024: CMP: Normal creatinine, normal LFTs, CBC: WBC: 3.2, hemoglobin: 8.6, platelets: 195.  Physical exam stable - Will coordinate with Dr. [name] and the surgical team at [facility].  As per discussions with Dr. [name] and Dr. [name], surgical resection is not an option anymore. - Will repeat PET scan in 2 months from prior PET scan as per previous discussions.  Return to clinic after PET scan to discuss results and further management  Dysphagia and weight loss secondary to cancer  therapy Dysphagia and weight loss due to radiation therapy.  Patient recently lost some weight today but had education on nutritional supplementation and is on the track of gaining weight.  - Continue feeding tube nutrition as tolerated. - Recommend taking fluids in the tube as tolerated to prevent dehydration.  Peripheral neuropathy Numbness and tingling in extremities likely related to cancer treatment.  Stable at this time  Hypothyroidism post-thyroidectomy Hypothyroidism managed with levothyroxine .  Currently taking 200 mcg daily.  - Continue current thyroid  medication. - Continue to follow-up with primary care who is managing it.  Major depressive disorder Mood symptoms have improved from previous visit.  - Continue sertraline  and mirtazapine  as prescribed.  A-fib Recently admitted to the hospital for A-fib with RVR likely secondary to dehydration Required cardioversion with IV amiodarone .  On full dose Eliquis  anticoagulation.  -Continue Eliquis  as prescribed - Continue to follow with cardiology  GERD Will increase omeprazole  to 40mg  BID and patient prefers a tabs to crush and take through the tube  The patient understands the plans discussed today and is in agreement with them.  He knows to contact our office if he develops concerns prior to his next appointment.  34 minutes of total time was spent for this patient encounter, including preparation, face-to-face counseling with the patient and coordination of care, physical exam, and documentation of the encounter.    Siad Davonna, MD  [facility] [place] - A DEPT OF [facility], [place] [address] [place] [address] Dept: [phone] Dept Fax: [phone]   No orders of the defined types were placed in  this encounter.    ONCOLOGY HISTORY:   I have reviewed his chart and materials related to his cancer extensively and collaborated history with the  patient. 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  Pathology: Poorly differentiated adenocarcinoma with mucinous and signet ring cell features. -01/05/2023: CT CAP:  Substantial irregular wall thickening of the distal half of the thoracic esophagus extending over a 8.5 cm length, compatible with tumor. No findings of metastatic disease in the chest, abdomen, or pelvis. -01/22/2023: Initial PET: Circumferential wall thickening in the distal third of the thoracic esophagus, with maximum SUV 5.7, compatible with malignancy. -01/29/2023: Germline mutation testing: Negative with the VUS of APC and RET -02/02/2023: EGD/EUS: Large fungating and ulcerating mass in the distal esophagus, at the GE junction extending into the cardia, 32 to 42 cm from the incisors. Mass was partially obstructing and circumferential. Sonographic evidence suggesting invasion into the muscularis propria and adventitia. 1 enlarged lymph node in the lower paraesophageal mediastinum measuring 4 x 3 mm, which was round, hypoechoic and had well-defined margins. FNB not performed.  -[facility] NGS testing: HER2 (0), MS-stable, TMB-low, CLDN 18-negative -02/16/2023-04/09/2023: 4 cycles of neoadjuvant FLOT (ESOPEC trial) -04/23/2023: Patient evaluated by Dr. Medora rodes surgeon], who recommended esophagectomy. Patient was reluctant to undergo surgery, so proceeded with EGD with dilation and biopsy.  -05/11/2023: EGD: Severe erosion in the distal esophagus consistent with the location of the tumor. The esophagus is significantly narrowed. Biopsies taken at 36, 38, 40 cm.  -Pathology at 36 cm and 38 cm: Atypical cells present suspicious for adenocarcinoma.  -Pathology at 40 cm: Poorly  differentiated invasive mucinous adenocarcinoma with patchy signet ring cells.  -06/04/2023: Patient seen by Dr. Sheilda coons oncologist] at [facility] who recommended 4 additional cycles of FLOT and surgical reevaluation. -06/15/2023 - 07/27/2023: 4 more cycles of FLOT completed -08/27/2023: PET: Mild thickening of distal esophagus with mild hypermetabolic activity. No evidence of hypermetabolic disease in the chest, abdomen or pelvis.  -09/29/2023: CT CAP: No measurable peritoneal or omental disease. Small volume trace free fluid versus ill-defined omental/peritoneal implants as above. Diffuse wall thickening of the gastroesophageal junction, compatible with malignancy.  Bilateral lower lung predominant consolidation and groundglass, suspicious for aspiration and atelectasis, infection could have a similar appearance. Trace bilateral pleural effusions.  -09/29/2023: Attempted esophagectomy with Dr.Salfity at [facility]  but aborted due evidence of bulky tumor at [facility], splenic, and omental metastases and metastases localized to gastric fundus and body limiting width of potential conduit. A j-tube was placed. Omental nodules were biopsied -09/29/2023: Right omentum nodule biopsy.  -Pathology of right omentum nodule 2: Mesothelial-lined fibroadipose tissue and smooth muscle with no pathologic change. Negative for malignancy. -Pathology of right omental nodule 1: Fibroadipose tissue with necrosis. Negative for malignancy. -11/02/2023-12/07/2023: ChemoRT with carboplatin  and paclitaxel . (Chose not 5-FU based regimen as patient did not have great response to 5-FU based regimen previously) -01/08/2024: CT CAP: Circumferential wall thickening in the distal esophagus is similar to prior PET-CT. 8 mm short axis paraesophageal node is minimally progressive in the interval with adjacent eccentric focal wall thickening in the proximal anterior esophagus. Innumerable scattered tiny ground-glass nodules in both lungs with  scattered solid pulmonary nodules measuring up to 5 mm. No evidence for metastatic disease in the  abdomen or pelvis.   Current Treatment: Chemo RT with carboplatin  and paclitaxel   INTERVAL HISTORY:   Discussed the use of AI scribe software for clinical note transcription with the patient, who gave verbal consent to proceed.  History of Present Illness Samuel Hubbard is a 69 year old male with stage III poorly differentiated adenocarcinoma of the distal esophagus who presents for evaluation of disease progression.  He continues to experience significant dysphagia with minimal improvement, is unable to tolerate oral intake, and relies exclusively on a feeding tube for nutrition. He is scheduled for a barium swallow and has required replacement of the tube due to NCAP wearing off. He denies any oral intake at present.  He reports persistent cough with frequent expectoration of mucus, which ranges in color from clear to black or brown. He denies environmental exposures to smoke or wood-burning sources. He also experiences sudden episodes of emesis with a strong acidic taste, sometimes occurring independently of cough or oral intake. Fatigue and ongoing weight loss persist. He is not currently taking oral iron  supplementation despite iron  deficiency anemia.  He expresses emotional distress and confusion regarding the complexity and pace of his care, seeking clarification on next steps and provider involvement. His mood remains low despite mirtazapine  and olanzapine , with no significant improvement in mood or appetite. He has declined therapy.  He is taking thyroid  medication, recently increased due to fluctuating thyroid  levels, and administers this via feeding tube. He is also on potassium chloride  for previously low potassium levels, which are now normalized. He is not taking diuretics.  Feb 09, 2024: Follow-up for stage III distal esophageal adenocarcinoma after completion of chemoradiotherapy with  carboplatin  and paclitaxel , showing good response on CT with stable 8 mm paraesophageal node. Surgical resection no longer an option; PET scan planned in 2 months. Patient experiencing dysphagia and weight loss managed with feeding tube, ongoing peripheral neuropathy, hypothyroidism, major depressive disorder, atrial fibrillation, and GERD; labs showed low WBC and hemoglobin, creatinine normal. Fatigue, worsened GERD, and mood issues addressed with medication adjustments and nutritional support.    I have reviewed the past medical history, past surgical history, social history and family history with the patient and they are unchanged from previous note.  ALLERGIES:  is allergic to nutren 1.0 [compleat], other, peanut-containing drug products, wasp venom, zolpidem, and valsartan.  MEDICATIONS:  Current Outpatient Medications  Medication Sig Dispense Refill   amiodarone  (PACERONE ) 200 MG tablet 1 po BID thru 02/23/24 then 1 po daily 50 tablet 1   apixaban  (ELIQUIS ) 5 MG TABS tablet Take 1 tablet (5 mg total) by mouth 2 (two) times daily. 60 tablet 0   atorvastatin  (LIPITOR) 40 MG tablet Take 1 tablet (40 mg total) by mouth daily. 90 tablet 3   citalopram  (CELEXA ) 20 MG tablet Take 1 tablet (20 mg total) by mouth daily. 90 tablet 3   famotidine  (PEPCID ) 40 MG/5ML suspension Place 2.5 mLs (20 mg total) into feeding tube 2 (two) times daily. 50 mL 2   Lactulose  20 GM/30ML SOLN Place 30 mLs (20 g total) into feeding tube at bedtime as needed (May take every 3 hours if no bowel movement in 2-3 days until adequate bowel movement). 450 mL 2   levothyroxine  (SYNTHROID ) 200 MCG tablet Take 1 tablet (200 mcg total) by mouth daily. 90 tablet 1   lidocaine -prilocaine  (EMLA ) cream Apply to affected area once 30 g 3   loratadine (CLARITIN) 5 MG/5ML syrup 10 mg by Enteral route daily.     LORazepam  (ATIVAN )  0.5 MG tablet Take 1 tablet (0.5 mg total) by mouth 2 (two) times daily. (Patient taking differently: Take  0.5 mg by mouth 2 (two) times daily as needed for anxiety.) 60 tablet 2   metFORMIN  (GLUCOPHAGE ) 500 MG tablet TAKE TWO TABLETS BY MOUTH TWICE DAILY WITH MEALS 120 tablet 3   mirtazapine  (REMERON ) 15 MG tablet TAKE ONE TABLET BY MOUTH AT BEDTIME 30 tablet 2   Multiple Vitamin (MULTIVITAMIN WITH MINERALS) TABS tablet Place 1 tablet into feeding tube daily.     nitroGLYCERIN  (NITROSTAT ) 0.4 MG SL tablet Place 1 tablet (0.4 mg total) under the tongue every 5 (five) minutes as needed for chest pain. 50 tablet 3   OLANZapine  (ZYPREXA ) 5 MG tablet Take 1 tablet (5 mg total) by mouth at bedtime. 30 tablet 1   omeprazole  (KONVOMEP) 2 mg/mL SUSP oral suspension Place 20 mLs (40 mg total) into feeding tube 2 (two) times daily before a meal. 1200 mL 0   ondansetron  (ZOFRAN -ODT) 4 MG disintegrating tablet Take 1 tablet (4 mg total) by mouth every 8 (eight) hours as needed for nausea or vomiting. 120 tablet 2   oxyCODONE  (ROXICODONE ) 5 MG/5ML solution Place 5 mLs (5 mg total) into feeding tube every 4 (four) hours as needed for severe pain (pain score 7-10). 473 mL 0   polyethylene glycol (MIRALAX  / GLYCOLAX ) 17 g packet 17 g by Enteral route once.     Potassium Chloride  40 MEQ/15ML (20%) SOLN 15 mLs by PEG Tube route daily. 900 mL 1   sucralfate  (CARAFATE ) 1 g tablet Take 1 g by mouth 2 (two) times daily. (Patient not taking: Reported on 03/04/2024)     TYLENOL  CHILDRENS 160 MG/5ML suspension Take 960 mg by mouth every 6 (six) hours as needed for mild pain (pain score 1-3) or moderate pain (pain score 4-6).     Water  For Irrigation, Sterile (FREE WATER ) SOLN Place 180 mLs into feeding tube 5 (five) times daily.     No current facility-administered medications for this visit.    VITALS:  There were no vitals taken for this visit.  Wt Readings from Last 3 Encounters:  03/04/24 191 lb 12.8 oz (87 kg)  03/03/24 193 lb (87.5 kg)  02/09/24 199 lb (90.3 kg)    There is no height or weight on file to calculate  BMI.  Performance status (ECOG): 1 - Symptomatic but completely ambulatory  PHYSICAL EXAM:   GENERAL:alert, no distress and comfortable SKIN: skin color, texture, turgor are normal, no rashes or significant lesions, port site clean LYMPH:  no palpable lymphadenopathy in the cervical, axillary or inguinal LUNGS: clear to auscultation and percussion with normal breathing effort HEART: regular rate & rhythm and no murmurs and no lower extremity edema ABDOMEN:abdomen soft, non-tender and normal bowel sounds, PEG tube site clean Musculoskeletal:no cyanosis of digits and no clubbing  NEURO: alert & oriented x 3 with fluent speech  LABORATORY DATA:  I have reviewed the data as listed  Lab Results  Component Value Date   WBC 6.5 03/17/2024   NEUTROABS 5.5 03/17/2024   HGB 10.9 (L) 03/17/2024   HCT 36.2 (L) 03/17/2024   MCV 91.4 03/17/2024   PLT 237 03/17/2024     Chemistry      Component Value Date/Time   NA 139 03/17/2024 0852   NA 140 12/01/2022 1513   K 4.2 03/17/2024 0852   CL 100 03/17/2024 0852   CO2 26 03/17/2024 0852   BUN 22 03/17/2024 9147  BUN 17 12/01/2022 1513   CREATININE 0.48 (L) 03/17/2024 0852   CREATININE 1.10 10/13/2018 1054      Component Value Date/Time   CALCIUM  9.5 03/17/2024 0852   ALKPHOS 70 03/17/2024 0852   AST 13 (L) 03/17/2024 0852   ALT 14 03/17/2024 0852   BILITOT <0.2 03/17/2024 0852   BILITOT 0.3 12/01/2022 1513      Latest Reference Range & Units 01/12/24 15:25  TSH 0.450 - 4.500 uIU/mL 0.163 (L)  T4,Free(Direct) 0.82 - 1.77 ng/dL 8.30  (L): Data is abnormally low  RADIOGRAPHIC STUDIES: I have personally reviewed the radiological images as listed and agreed with the findings in the report. "

## 2024-03-24 ENCOUNTER — Other Ambulatory Visit: Payer: Self-pay | Admitting: Oncology

## 2024-03-24 ENCOUNTER — Ambulatory Visit: Admitting: Physical Therapy

## 2024-03-24 ENCOUNTER — Encounter: Payer: Self-pay | Admitting: Physical Therapy

## 2024-03-24 DIAGNOSIS — R2689 Other abnormalities of gait and mobility: Secondary | ICD-10-CM

## 2024-03-24 DIAGNOSIS — M6281 Muscle weakness (generalized): Secondary | ICD-10-CM

## 2024-03-24 DIAGNOSIS — G893 Neoplasm related pain (acute) (chronic): Secondary | ICD-10-CM
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
## 2024-03-25 ENCOUNTER — Other Ambulatory Visit: Payer: Self-pay | Admitting: Oncology

## 2024-03-25 DIAGNOSIS — G893 Neoplasm related pain (acute) (chronic): Secondary | ICD-10-CM

## 2024-03-28 ENCOUNTER — Other Ambulatory Visit (HOSPITAL_COMMUNITY)

## 2024-03-28 ENCOUNTER — Ambulatory Visit (HOSPITAL_COMMUNITY): Admitting: Speech Pathology

## 2024-03-30 ENCOUNTER — Inpatient Hospital Stay

## 2024-04-01 ENCOUNTER — Ambulatory Visit: Admitting: Physical Therapy

## 2024-04-04 ENCOUNTER — Inpatient Hospital Stay: Admitting: Dietician

## 2024-04-19 ENCOUNTER — Ambulatory Visit: Admitting: Gastroenterology

## 2024-04-28 ENCOUNTER — Encounter: Admitting: Physician Assistant

## 2024-04-29 ENCOUNTER — Ambulatory Visit: Admitting: Nurse Practitioner

## 2024-05-04 ENCOUNTER — Other Ambulatory Visit (HOSPITAL_COMMUNITY)

## 2024-05-06 ENCOUNTER — Encounter (HOSPITAL_COMMUNITY): Admission: RE | Payer: Self-pay | Source: Home / Self Care

## 2024-05-06 ENCOUNTER — Ambulatory Visit (HOSPITAL_COMMUNITY): Admission: RE | Admit: 2024-05-06 | Source: Home / Self Care | Admitting: Ophthalmology

## 2024-05-06 SURGERY — PHACOEMULSIFICATION, CATARACT, WITH IOL INSERTION
Anesthesia: Monitor Anesthesia Care | Laterality: Left

## 2024-05-11 ENCOUNTER — Ambulatory Visit: Admitting: Internal Medicine

## 2024-05-16 ENCOUNTER — Ambulatory Visit
